# Patient Record
Sex: Female | Born: 1950 | Race: White | Hispanic: No | Marital: Married | Smoking: Former smoker
Health system: Southern US, Community
[De-identification: ages and names within clinical notes are randomized; demographics above are authoritative.]

## PROBLEM LIST (undated history)

## (undated) DIAGNOSIS — Z460 Encounter for fitting and adjustment of spectacles and contact lenses: Secondary | ICD-10-CM

## (undated) DIAGNOSIS — K219 Gastro-esophageal reflux disease without esophagitis: Secondary | ICD-10-CM

## (undated) DIAGNOSIS — Z923 Personal history of irradiation: Secondary | ICD-10-CM

## (undated) DIAGNOSIS — C801 Malignant (primary) neoplasm, unspecified: Secondary | ICD-10-CM

## (undated) DIAGNOSIS — K831 Obstruction of bile duct: Secondary | ICD-10-CM

## (undated) DIAGNOSIS — C419 Malignant neoplasm of bone and articular cartilage, unspecified: Secondary | ICD-10-CM

## (undated) HISTORY — DX: Hypercalcemia: E83.52

---

## 1998-07-11 HISTORY — PX: BREAST SURGERY: SHX581

## 1999-01-09 HISTORY — PX: OTHER SURGICAL HISTORY: SHX169

## 2000-10-18 ENCOUNTER — Other Ambulatory Visit: Admission: RE | Admit: 2000-10-18 | Discharge: 2000-10-18 | Payer: Self-pay | Admitting: *Deleted

## 2002-04-03 ENCOUNTER — Other Ambulatory Visit: Admission: RE | Admit: 2002-04-03 | Discharge: 2002-04-03 | Payer: Self-pay | Admitting: *Deleted

## 2003-07-31 ENCOUNTER — Other Ambulatory Visit: Admission: RE | Admit: 2003-07-31 | Discharge: 2003-07-31 | Payer: Self-pay | Admitting: Internal Medicine

## 2003-08-05 ENCOUNTER — Encounter: Admission: RE | Admit: 2003-08-05 | Discharge: 2003-08-05 | Payer: Self-pay | Admitting: Internal Medicine

## 2003-09-30 ENCOUNTER — Encounter: Admission: RE | Admit: 2003-09-30 | Discharge: 2003-09-30 | Payer: Self-pay | Admitting: Internal Medicine

## 2005-06-13 ENCOUNTER — Other Ambulatory Visit: Admission: RE | Admit: 2005-06-13 | Discharge: 2005-06-13 | Payer: Self-pay | Admitting: Obstetrics and Gynecology

## 2006-11-10 ENCOUNTER — Other Ambulatory Visit: Admission: RE | Admit: 2006-11-10 | Discharge: 2006-11-10 | Payer: Self-pay | Admitting: *Deleted

## 2008-11-03 ENCOUNTER — Other Ambulatory Visit: Admission: RE | Admit: 2008-11-03 | Discharge: 2008-11-03 | Payer: Self-pay | Admitting: Family Medicine

## 2008-12-15 ENCOUNTER — Encounter: Admission: RE | Admit: 2008-12-15 | Discharge: 2008-12-15 | Payer: Self-pay | Admitting: Family Medicine

## 2010-09-21 ENCOUNTER — Other Ambulatory Visit: Payer: Self-pay | Admitting: Family Medicine

## 2010-09-21 ENCOUNTER — Ambulatory Visit
Admission: RE | Admit: 2010-09-21 | Discharge: 2010-09-21 | Disposition: A | Discharge status: Home / Self Care | Payer: Self-pay | Source: Ambulatory Visit | Attending: Family Medicine | Admitting: Family Medicine

## 2010-09-21 DIAGNOSIS — T148XXA Other injury of unspecified body region, initial encounter: Secondary | ICD-10-CM

## 2011-05-23 ENCOUNTER — Ambulatory Visit
Admission: RE | Admit: 2011-05-23 | Discharge: 2011-05-23 | Disposition: A | Discharge status: Home / Self Care | Payer: BC Managed Care – PPO | Source: Ambulatory Visit | Attending: Family Medicine | Admitting: Family Medicine

## 2011-05-23 ENCOUNTER — Other Ambulatory Visit: Payer: Self-pay | Admitting: Family Medicine

## 2011-05-23 DIAGNOSIS — R52 Pain, unspecified: Secondary | ICD-10-CM

## 2011-08-17 ENCOUNTER — Other Ambulatory Visit (HOSPITAL_COMMUNITY)
Admission: RE | Admit: 2011-08-17 | Discharge: 2011-08-17 | Disposition: A | Discharge status: Home / Self Care | Payer: BC Managed Care – PPO | Source: Ambulatory Visit | Attending: Internal Medicine | Admitting: Internal Medicine

## 2011-08-17 ENCOUNTER — Other Ambulatory Visit: Payer: Self-pay | Admitting: Family Medicine

## 2011-08-17 DIAGNOSIS — Z01419 Encounter for gynecological examination (general) (routine) without abnormal findings: Secondary | ICD-10-CM | POA: Insufficient documentation

## 2012-09-06 ENCOUNTER — Other Ambulatory Visit: Payer: Self-pay | Admitting: Dermatology

## 2012-10-26 ENCOUNTER — Ambulatory Visit (INDEPENDENT_AMBULATORY_CARE_PROVIDER_SITE_OTHER): Payer: BC Managed Care – PPO | Admitting: Sports Medicine

## 2012-10-26 ENCOUNTER — Encounter: Payer: Self-pay | Admitting: Sports Medicine

## 2012-10-26 VITALS — BP 121/73 | HR 71 | Ht 65.0 in | Wt 148.0 lb

## 2012-10-26 DIAGNOSIS — S76312A Strain of muscle, fascia and tendon of the posterior muscle group at thigh level, left thigh, initial encounter: Secondary | ICD-10-CM

## 2012-10-26 DIAGNOSIS — IMO0002 Reserved for concepts with insufficient information to code with codable children: Secondary | ICD-10-CM

## 2012-10-26 DIAGNOSIS — S76319A Strain of muscle, fascia and tendon of the posterior muscle group at thigh level, unspecified thigh, initial encounter: Secondary | ICD-10-CM | POA: Insufficient documentation

## 2012-10-26 NOTE — Patient Instructions (Addendum)
Thank you for coming in today. Do the hamstring exercises we talked about: Google video search for askling hamstring protocol Extender Swing Diver  Hip abduction exercises Side leg raise Front step ups Side step ups  Wear the sleeve during your runs and 30-60 mins after. Do not sleep in it.   Take a picture of you at the finish line and send it in to [place].

## 2012-10-26 NOTE — Assessment & Plan Note (Signed)
Left hamstring strain. Associated with weak hip abductors  H. test okay.  Plan: Askling hamstring exercises Hip abduction strength exercise Thigh sleeve Followup as needed

## 2012-10-26 NOTE — Progress Notes (Signed)
Bianca Logan is a 62 y.o. female who presents to [facility] today for left hamstring injury. Patient is training for her first half marathon which is scheduled for May 3.  One month ago doing a hill work out she felt a pulling sensation in her left hamstring.  She has had continued left hamstring pain since. She notes the pain is present initially for the first several miles which improved during a run and is again painful following the end of the right. She is able to continue running and ran a long 12 mile run last weekend.  She feels well otherwise no radiating pain weakness numbness fevers or chills.   PMH reviewed.  History  Substance Use Topics  . Smoking status: Former Smoker    Start date: 07/11/1984  . Smokeless tobacco: Never Used  . Alcohol Use: Not on file   ROS as above otherwise neg   Exam:  BP 121/73  Pulse 71  Ht 5\' 5"  (1.651 m)  Wt 148 lb (67.132 kg)  BMI 24.63 kg/m2 Gen: Well NAD MSK: Left hamstring. Normal-appearing no palpable defects. Tender in the mid muscle belly. Strength is 4+/5 H. Test: 90 bilaterally Hop test: 50% decreased left compared to right Hip abduction strength: 4/5 bilaterally Leg length: Equal bilaterally Gait: Left foot externally rotated. Heel striker.  Pulses capillary refill and sensation are intact distally

## 2012-12-06 ENCOUNTER — Ambulatory Visit: Payer: BC Managed Care – PPO | Admitting: Sports Medicine

## 2013-04-25 ENCOUNTER — Ambulatory Visit
Admission: RE | Admit: 2013-04-25 | Discharge: 2013-04-25 | Disposition: A | Discharge status: Home / Self Care | Payer: BC Managed Care – PPO | Source: Ambulatory Visit | Attending: Family Medicine | Admitting: Family Medicine

## 2013-04-25 ENCOUNTER — Other Ambulatory Visit: Payer: Self-pay | Admitting: Family Medicine

## 2013-04-25 DIAGNOSIS — R110 Nausea: Secondary | ICD-10-CM

## 2013-04-25 DIAGNOSIS — R630 Anorexia: Secondary | ICD-10-CM

## 2013-04-25 MED ORDER — IOHEXOL 300 MG/ML  SOLN
100.0000 mL | Freq: Once | INTRAMUSCULAR | Status: AC | PRN
  Administered 2013-04-25: 100 mL via INTRAVENOUS

## 2013-04-26 ENCOUNTER — Encounter (HOSPITAL_COMMUNITY): Payer: Self-pay

## 2013-04-26 ENCOUNTER — Inpatient Hospital Stay (HOSPITAL_COMMUNITY): Payer: BC Managed Care – PPO

## 2013-04-26 ENCOUNTER — Inpatient Hospital Stay (HOSPITAL_COMMUNITY)
Admission: AD | Admit: 2013-04-26 | Discharge: 2013-04-27 | DRG: 444 | Disposition: A | Discharge status: Home / Self Care | Payer: BC Managed Care – PPO | Source: Ambulatory Visit | Attending: Internal Medicine | Admitting: Internal Medicine

## 2013-04-26 DIAGNOSIS — E43 Unspecified severe protein-calorie malnutrition: Secondary | ICD-10-CM | POA: Diagnosis present

## 2013-04-26 DIAGNOSIS — K838 Other specified diseases of biliary tract: Principal | ICD-10-CM

## 2013-04-26 DIAGNOSIS — C786 Secondary malignant neoplasm of retroperitoneum and peritoneum: Secondary | ICD-10-CM

## 2013-04-26 DIAGNOSIS — IMO0002 Reserved for concepts with insufficient information to code with codable children: Secondary | ICD-10-CM

## 2013-04-26 DIAGNOSIS — C259 Malignant neoplasm of pancreas, unspecified: Secondary | ICD-10-CM | POA: Diagnosis present

## 2013-04-26 DIAGNOSIS — C801 Malignant (primary) neoplasm, unspecified: Secondary | ICD-10-CM | POA: Diagnosis present

## 2013-04-26 DIAGNOSIS — R17 Unspecified jaundice: Secondary | ICD-10-CM

## 2013-04-26 DIAGNOSIS — M542 Cervicalgia: Secondary | ICD-10-CM

## 2013-04-26 DIAGNOSIS — C787 Secondary malignant neoplasm of liver and intrahepatic bile duct: Secondary | ICD-10-CM | POA: Diagnosis present

## 2013-04-26 DIAGNOSIS — S76319S Strain of muscle, fascia and tendon of the posterior muscle group at thigh level, unspecified thigh, sequela: Secondary | ICD-10-CM

## 2013-04-26 DIAGNOSIS — C8589 Other specified types of non-Hodgkin lymphoma, extranodal and solid organ sites: Secondary | ICD-10-CM

## 2013-04-26 DIAGNOSIS — S76319D Strain of muscle, fascia and tendon of the posterior muscle group at thigh level, unspecified thigh, subsequent encounter: Secondary | ICD-10-CM

## 2013-04-26 DIAGNOSIS — R110 Nausea: Secondary | ICD-10-CM

## 2013-04-26 DIAGNOSIS — R109 Unspecified abdominal pain: Secondary | ICD-10-CM

## 2013-04-26 HISTORY — DX: Obstruction of bile duct: K83.1

## 2013-04-26 HISTORY — DX: Malignant (primary) neoplasm, unspecified: C80.1

## 2013-04-26 LAB — CBC
HCT: 37 % (ref 36.0–46.0)
MCHC: 33.5 g/dL (ref 30.0–36.0)
Platelets: 502 10*3/uL — ABNORMAL HIGH (ref 150–400)
RBC: 4.34 MIL/uL (ref 3.87–5.11)
RDW: 13.9 % (ref 11.5–15.5)
WBC: 10.6 10*3/uL — ABNORMAL HIGH (ref 4.0–10.5)

## 2013-04-26 LAB — COMPREHENSIVE METABOLIC PANEL
ALT: 229 U/L — ABNORMAL HIGH (ref 0–35)
AST: 84 U/L — ABNORMAL HIGH (ref 0–37)
CO2: 27 mEq/L (ref 19–32)
Calcium: 9.7 mg/dL (ref 8.4–10.5)
Creatinine, Ser: 0.71 mg/dL (ref 0.50–1.10)
GFR calc non Af Amer: >90 mL/min (ref >90)
Sodium: 135 mEq/L (ref 135–145)
Total Protein: 7.2 g/dL (ref 6.0–8.3)

## 2013-04-26 LAB — PHOSPHORUS: Phosphorus: 3.3 mg/dL (ref 2.3–4.6)

## 2013-04-26 LAB — MAGNESIUM: Magnesium: 2.1 mg/dL (ref 1.5–2.5)

## 2013-04-26 MED ORDER — ONDANSETRON HCL 4 MG/2ML IJ SOLN
4.0000 mg | INTRAMUSCULAR | Status: DC | PRN
  Filled 2013-04-26: qty 2

## 2013-04-26 MED ORDER — INFLUENZA VAC SPLIT QUAD 0.5 ML IM SUSP
0.5000 mL | INTRAMUSCULAR | Status: AC
  Administered 2013-04-27: 0.5 mL via INTRAMUSCULAR
  Filled 2013-04-26 (×2): qty 0.5

## 2013-04-26 MED ORDER — ENSURE COMPLETE PO LIQD: 237.0000 mL | ORAL | Status: DC | PRN

## 2013-04-26 MED ORDER — ONDANSETRON HCL 4 MG PO TABS: 4.0000 mg | ORAL_TABLET | Freq: Four times a day (QID) | ORAL | Status: DC | PRN

## 2013-04-26 MED ORDER — LORAZEPAM 2 MG/ML IJ SOLN
1.0000 mg | Freq: Every evening | INTRAMUSCULAR | Status: DC | PRN
  Administered 2013-04-26: 1 mg via INTRAVENOUS
  Filled 2013-04-26: qty 1

## 2013-04-26 MED ORDER — ONDANSETRON HCL 4 MG/2ML IJ SOLN: 4.0000 mg | Freq: Four times a day (QID) | INTRAMUSCULAR | Status: DC | PRN

## 2013-04-26 MED ORDER — HYDROMORPHONE HCL PF 1 MG/ML IJ SOLN: 1.0000 mg | INTRAMUSCULAR | Status: DC | PRN

## 2013-04-26 MED ORDER — GADOBENATE DIMEGLUMINE 529 MG/ML IV SOLN
13.0000 mL | Freq: Once | INTRAVENOUS | Status: AC | PRN
  Administered 2013-04-26: 13 mL via INTRAVENOUS

## 2013-04-26 MED ORDER — OXYCODONE HCL 5 MG PO TABS
5.0000 mg | ORAL_TABLET | ORAL | Status: DC | PRN
  Administered 2013-04-26: 5 mg via ORAL
  Filled 2013-04-26: qty 1

## 2013-04-26 MED ORDER — ENOXAPARIN SODIUM 40 MG/0.4ML ~~LOC~~ SOLN
40.0000 mg | SUBCUTANEOUS | Status: DC
  Filled 2013-04-26 (×2): qty 0.4

## 2013-04-26 NOTE — Consult Note (Signed)
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 She then began to develop the indolent onset of nausea and a dull upper abdominal ache. She took a nonsteroidal which made her symptoms worse. She changed to Tylenol which did not help at all. She noticed a change in her stools becoming greenish yellow. She has not had any fever. She reported these symptoms to her primary care physician. I do not have the laboratory data available that was done yesterday. I suspect her bilirubin was elevated. She was referred for a CT scan of the abdomen which was done yesterday. I reviewed the images. This shows significant intra-and extrahepatic bile duct obstruction, an amorphous soft tissue mass in the porta hepatis with additional malignant appearing retroperitoneal lymph nodes bilaterally largest 2.6 x 1.9 cm. There is a 1.8 cm lesion in the right hepatic dome and a second 2.3 cm lesion inferiorly in the tail of the right lobe. There is no focal abnormality of the pancreas. Incidentally noted is a thickened gallbladder wall with multiple gallstones. There is a mild to moderate compression fracture superior endplate L1. A tiny sclerotic focus within the inferior aspect of L4.  Her mother died at age 44 of metastatic cancer unknown primary.   Past Medical History  Diagnosis Date  . Medical history non-contributory   : No hypertension, diabetes, ulcers, asthma, hepatitis, previous jaundice, kidney stones, thyroid trouble, seizure, stroke, or blood clots.   Past Surgical History   Procedure Laterality Date  . Cyst removed from left breast "benign   01/1999  :  . [START ON 04/27/2013] influenza vac split quadrivalent PF  0.5 mL Intramuscular Tomorrow-1000  :  No Known Allergies:  Family History  Problem Relation Age of Onset  . Cancer Mother  53   . Heart attack Father   . Anuerysm Sister  45   . Obesity Brother  51   :  History  Married. 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 2 daughters who are both healthy. Social History  . Marital Status: Married    Spouse Name: N/A    Number of Children: N/A  . Years of Education: N/A   Occupational History  .  legal assistant    Social History Main Topics  . Smoking status: Former Smoker    Types: Cigarettes    Start date: 07/11/1984    Quit date: 04/26/1985  . Smokeless tobacco: Never Used  . Alcohol Use: 2.4 oz/week    2 Glasses of wine, 2 Cans of beer per week  . Drug Use: No  . Sexual Activity: Yes    Birth Control/ Protection: None   Other Topics Concern  . Not on file   Social History Narrative  . No narrative on file  :  ROS: Eyes: No change in vision Throat: No sore throat Neck: See history of present illness Resp: No cough or dyspnea  Cardio: No chest pain or palpitations GI: See history of present illness. She denies any hematochezia or melena. Extremities: No edema Lymph nodes: No swollen  glands Neurologic: Intermittent headaches associated with right neck pain   Skin: No rash. Recent jaundice.. Genitourinary: Recent hematuria felt to be secondary to a urinary tract infection.   Vitals: Filed Vitals:   04/26/13 1059  BP: 109/57  Pulse: 81  Temp: 98.7 F (37.1 C)  Resp: 20    PHYSICAL EXAM: General appearance: Well-nourished Caucasian woman HEENT: Pharynx no erythema or exudate. No thyromegaly or thyroid nodules Lymph Nodes: No cervical, supraclavicular, or axillary adenopathy Resp: Clear to auscultation resonant to percussion Cardio: Regular rhythm no  murmur Vascular: Carotids 2+ no bruits Breasts: Not examined GI: Abdomen is soft, minimally tender in the epigastrium, increased bowel sounds, no palpable mass or organomegaly GU: Extremities: No edema, no calf tenderness Neurologic: Mental status intact, PERRLA, full extraocular movements, motor strength 5 over 5, reflexes 1+ symmetric. Upper body coordination normal. Gait not tested Skin: Mildly jaundiced  Labs: Pending  No results found for this basename: WBC, HGB, HCT, PLT,  in the last 72 hours No results found for this basename: NA, K, CL, CO2, GLUCOSE, BUN, CREATININE, CALCIUM,  in the last 72 hours    Images Studies/Results:  Ct Abdomen W Contrast  04/25/2013   CLINICAL DATA:  Upper abdominal pain, nausea, decreased appetite. Elevated LFTs.  EXAM: CT ABDOMEN WITH CONTRAST  TECHNIQUE: Multidetector CT imaging of the abdomen was performed using the standard protocol following bolus administration of intravenous contrast.  CONTRAST:  OMNIPAQUE IOHEXOL 300 MG/ML  SOLN  COMPARISON:  CT 12/15/2008  FINDINGS: Lung bases are clear. No effusions. Heart is normal size.  There is an in their scratch head there is an irregular solid-appearing lesion in the right hepatic dome measuring 18 mm on image 7 of series 2. Inferiorly in the right hepatic lobe, a 2nd solid-appearing lesion is noted measuring 2.3 cm on image 38.  Multiple layering gallstones noted within the gallbladder. There is gallbladder wall thickening measuring up to 8 mm near the fundus of the gallbladder. Significant intrahepatic biliary ductal dilatation. The common bile duct is difficult to visualize in the pancreas. The common bile duct is dilated in the porta hepatis, within not visualized below the superior aspect of the pancreatic head.  Spleen, adrenals and kidneys are unremarkable. No visible focal pancreatic abnormality.  There is retroperitoneal adenopathy. Bulky lymph nodes bilaterally on image 25. Aortocaval node on  image 25 measures 2.6 x 1.9 cm. Mildly prominent central mesenteric lymph nodes. Ill-defined porta hepatis adenopathy, difficult to measure.  Moderate stool throughout the colon. Visualized small bowel is decompressed and unremarkable. Aorta is normal caliber.  Mild to moderate compression fracture through the superior endplate of L1. Tiny sclerotic focus within the inferior anterior aspect of L4, nonspecific. Cannot exclude a small sclerotic metastasis.  IMPRESSION: Solid appearing ill-defined lesions in the right lobe of the liver as described above concerning for metastases.  Marked intrahepatic and proximal extrahepatic biliary ductal dilatation. The common bile duct cannot be visualized below the superior aspect of the pancreatic head. The bili obstruction may be related to compression from enlarged porta hepatis lymph nodes. Cannot exclude a Klatskin tumor other central biliary malignancy. No definite pancreatic head obstructing mass noted.  There is a focal gallbladder wall thickening, most likely related to cholecystitis. Cannot completely exclude gallbladder cancer.  Cholelithiasis.  Bulky retroperitoneal and porta hepatis adenopathy.  Recommend MRI of the abdomen without and with contrast and MRCP for further evaluation.  These results were discussed with Dr. Jillyn Hidden at the time of interpretation.  Electronically Signed   By: Charlett Nose M.D.   On: 04/25/2013 16:47      Assessment: Painless jaundice in an otherwise healthy woman. Malignant changes on CT scan with intra-and extrahepatic biliary duct obstruction and presence of a soft tissue mass in the porta hepatis with additional bilateral retroperitoneal lymphadenopathy. 2 lesions in the liver. Pancreas appears grossly normal.  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  Both of the liver lesions would be difficult to biopsy. Lesion in the dome with  increased risk for  pneumothorax. Lesion in the tail of the right lobe very superficial without a good margin of tissue surrounding so a biopsy would be associated with an increased risk for bleeding. Probably the best approach would be ERCP with biopsy and stenting to relieve immediate biliary obstruction and obtain a tissue diagnosis. She may also need a bone scan in view of the neck pain and atypical changes in the bones on CT although the likelihood of bone metastases with a GI malignancy is low.   I discussed the situation with gastroenterology Dr. Ewing Schlein who will evaluate the patient this afternoon. When a biopsy diagnosis is established, oncology will get involved again for a treatment plan.       Recommendation:   Lira Stephen M 04/26/2013, 1:31 PM

## 2013-04-26 NOTE — Consult Note (Signed)
Reason for Consult: Obstructive jaundice abnormal CT Referring Physician: James Granfortuna  Bianca Logan is an 62 y.o. female.  HPI: Patient without any previous GI complaints until about a week or 2 ago with some decreased appetite and nausea and change in bowel habits and darkening urine who was found to be jaundiced and CT showed probable hilar obstruction and she was admitted for further workup and plans. She did have a partial colonoscopy and virtual colonoscopy in the past by one of my partners and did have an abnormal mole removed and the pathology is on the computer but otherwise she has been very healthy in fact was planning a half marathon in November and she has not had any fever or vomiting and no other specific complaints  Past Medical History  Diagnosis Date  . Medical history non-contributory   . 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 Cyst removed from left breast  01/1999    Family History  Problem Relation Age of Onset  . Cancer Mother   . Heart attack Father   . Anuerysm Sister   . Obesity Brother     Social History:  reports that she quit smoking about 28 years ago. Her smoking use included Cigarettes. She started smoking about 28 years ago. She smoked 0.00 packs per day. She has never used smokeless tobacco. She reports that she drinks about 2.4 ounces of alcohol per week. She reports that she does not use illicit drugs.  Allergies: No Known Allergies  Medications: I have reviewed the patient's current medications.  Results for orders placed during the hospital encounter of 04/26/13 (from the past 48 hour(s))  COMPREHENSIVE METABOLIC PANEL     Status: Abnormal   Collection Time    04/26/13  3:40 PM      Result Value Range   Sodium 135  135 - 145 mEq/L   Potassium 3.3 (*) 3.5 - 5.1 mEq/L   Chloride 98  96 - 112 mEq/L   CO2 27  19 - 32 mEq/L   Glucose, Bld 126 (*) 70 - 99 mg/dL   BUN 11  6 - 23 mg/dL    Creatinine, Ser 0.71  0.50 - 1.10 mg/dL   Calcium 9.7  8.4 - 10.5 mg/dL   Total Protein 7.2  6.0 - 8.3 g/dL   Albumin 3.4 (*) 3.5 - 5.2 g/dL   AST 84 (*) 0 - 37 U/L   ALT 229 (*) 0 - 35 U/L   Alkaline Phosphatase 554 (*) 39 - 117 U/L   Total Bilirubin 7.5 (*) 0.3 - 1.2 mg/dL   GFR calc non Af Amer >90  >90 mL/min   GFR calc Af Amer >90  >90 mL/min   Comment: (NOTE)     The eGFR has been calculated using the CKD EPI equation.     This calculation has not been validated in all clinical situations.     eGFR's persistently <90 mL/min signify possible Chronic Kidney     Disease.  MAGNESIUM     Status: None   Collection Time    04/26/13  3:40 PM      Result Value Range   Magnesium 2.1  1.5 - 2.5 mg/dL  PHOSPHORUS     Status: None   Collection Time    04/26/13  3:40 PM      Result Value Range   Phosphorus 3.3  2.3 - 4.6 mg/dL  CBC     Status:   Abnormal   Collection Time    04/26/13  3:40 PM      Result Value Range   WBC 10.6 (*) 4.0 - 10.5 K/uL   RBC 4.34  3.87 - 5.11 MIL/uL   Hemoglobin 12.4  12.0 - 15.0 g/dL   HCT 37.0  36.0 - 46.0 %   MCV 85.3  78.0 - 100.0 fL   MCH 28.6  26.0 - 34.0 pg   MCHC 33.5  30.0 - 36.0 g/dL   RDW 13.9  11.5 - 15.5 %   Platelets 502 (*) 150 - 400 K/uL    Ct Abdomen W Contrast  04/25/2013   CLINICAL DATA:  Upper abdominal pain, nausea, decreased appetite. Elevated LFTs.  EXAM: CT ABDOMEN WITH CONTRAST  TECHNIQUE: Multidetector CT imaging of the abdomen was performed using the standard protocol following bolus administration of intravenous contrast.  CONTRAST:  100mL OMNIPAQUE IOHEXOL 300 MG/ML  SOLN  COMPARISON:  CT 12/15/2008  FINDINGS: Lung bases are clear. No effusions. Heart is normal size.  There is an in their scratch head there is an irregular solid-appearing lesion in the right hepatic dome measuring 18 mm on image 7 of series 2. Inferiorly in the right hepatic lobe, a 2nd solid-appearing lesion is noted measuring 2.3 cm on image 38.  Multiple  layering gallstones noted within the gallbladder. There is gallbladder wall thickening measuring up to 8 mm near the fundus of the gallbladder. Significant intrahepatic biliary ductal dilatation. The common bile duct is difficult to visualize in the pancreas. The common bile duct is dilated in the porta hepatis, within not visualized below the superior aspect of the pancreatic head.  Spleen, adrenals and kidneys are unremarkable. No visible focal pancreatic abnormality.  There is retroperitoneal adenopathy. Bulky lymph nodes bilaterally on image 25. Aortocaval node on image 25 measures 2.6 x 1.9 cm. Mildly prominent central mesenteric lymph nodes. Ill-defined porta hepatis adenopathy, difficult to measure.  Moderate stool throughout the colon. Visualized small bowel is decompressed and unremarkable. Aorta is normal caliber.  Mild to moderate compression fracture through the superior endplate of L1. Tiny sclerotic focus within the inferior anterior aspect of L4, nonspecific. Cannot exclude a small sclerotic metastasis.  IMPRESSION: Solid appearing ill-defined lesions in the right lobe of the liver as described above concerning for metastases.  Marked intrahepatic and proximal extrahepatic biliary ductal dilatation. The common bile duct cannot be visualized below the superior aspect of the pancreatic head. The bili obstruction may be related to compression from enlarged porta hepatis lymph nodes. Cannot exclude a Klatskin tumor other central biliary malignancy. No definite pancreatic head obstructing mass noted.  There is a focal gallbladder wall thickening, most likely related to cholecystitis. Cannot completely exclude gallbladder cancer.  Cholelithiasis.  Bulky retroperitoneal and porta hepatis adenopathy.  Recommend MRI of the abdomen without and with contrast and MRCP for further evaluation.  These results were discussed with Dr. Fulp at the time of interpretation.   Electronically Signed   By: Kevin  Dover M.D.    On: 04/25/2013 16:47    ROS negative except above Blood pressure 117/62, pulse 75, temperature 98.8 F (37.1 C), temperature source Oral, resp. rate 20, height 5' 4" (1.626 m), weight 63.957 kg (141 lb), SpO2 100.00%. Physical Exam vital signs stable afebrile no acute distress patient not examined today looks well lying comfortable in her bed office labs and hospital computer and CT reviewed  Assessment/Plan: Obstructive jaundice probably due to hilar mass questionable etiology Plan: I've discussed her   case with the oncologist as well as my partner Dr. outlaw and we will proceed with an MRCP today and then she can go home and return for an outpatient ERCP and probable stent placement and hopefully brushings as well and possible EUS on Tuesday and I have discussed the risks benefits methods of both procedure with both the husband and the wife and scheduled the above procedure with the endoscopy unit and they will call us sooner when necessary in the warnings were discussed  Dyneshia Baccam E 04/26/2013, 4:39 PM      

## 2013-04-26 NOTE — Progress Notes (Signed)
INITIAL NUTRITION ASSESSMENT  Pt meets criteria for severe MALNUTRITION in the context of acute illness as evidenced by <50% estimated energy intake with 3.4% weight loss in the past 1.5 weeks per pt report.  DOCUMENTATION CODES Per approved criteria  -Severe malnutrition in the context of acute illness or injury   INTERVENTION: - Ensure Complete PRN for additional nutrition as needed - Encouraged increased intake as nausea resolves - Will continue to monitor   NUTRITION DIAGNOSIS: Unintended weight loss related to nausea with poor appetite as evidenced by pt report.   Goal: 1. Complete resolution of nausea 2. Pt to consume >90% of meals  Monitor:  Weights, labs, intake  Reason for Assessment: Nutrition risk   62 y.o. female  Admitting Dx: Painless jaundice and biliary obstruction  ASSESSMENT: Pt admitted with 3 week history of right neck pain and low back pain. Pt started to develop nausea which she reports went on for 1.5 weeks. Pt denies any emesis but states she was only eating small amounts of food 3 times/day and lost 5 pounds unintentionally during this time frame. This is very unusual for her because she typically eats well and has a great appetite. Pt also had change in her stools becoming greenish yellow.   Pt found to have painless jaundice with malignant changes on CT scan with "intra-and extrahepatic biliary duct obstruction and presence of a soft tissue mass in the porta hepatis with additional bilateral retroperitoneal lymphadenopathy" per MD notes. Pt reports she has had improvement in nausea today and was able to eat some chocolate pudding for lunch. Pt interested in trying Ensure on an as needed basis as she was told by her MD that it would be beneficial for her.    Height: Ht Readings from Last 1 Encounters:  04/26/13 5\' 4"  (1.626 m)    Weight: Wt Readings from Last 1 Encounters:  04/26/13 141 lb (63.957 kg)    Ideal Body Weight: 120 lb   % Ideal Body  Weight: 117%  Wt Readings from Last 10 Encounters:  04/26/13 141 lb (63.957 kg)  10/26/12 148 lb (67.132 kg)    Usual Body Weight: 146 lb per pt  % Usual Body Weight: 96%  BMI:  Body mass index is 24.19 kg/(m^2).  Estimated Nutritional Needs: Kcal: 1600-1800 Protein: 65-75g Fluid: 1.6-1.8L/day  Skin: Intact   Diet Order: General  EDUCATION NEEDS: -No education needs identified at this time  No intake or output data in the 24 hours ending 04/26/13 1535  Last BM: PTA  Labs:  No results found for this basename: NA, K, CL, CO2, BUN, CREATININE, CALCIUM, MG, PHOS, GLUCOSE,  in the last 168 hours  CBG (last 3)  No results found for this basename: GLUCAP,  in the last 72 hours  Scheduled Meds: . enoxaparin (LOVENOX) injection  40 mg Subcutaneous Q24H  . [START ON 04/27/2013] influenza vac split quadrivalent PF  0.5 mL Intramuscular Tomorrow-1000    Continuous Infusions:   Past Medical History  Diagnosis Date  . Medical history non-contributory   . 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 Cyst removed from left breast  01/1999    Levon Hedger MS, RD, LDN [phone] Pager [phone] After Hours Pager

## 2013-04-26 NOTE — H&P (Signed)
[facility] History and Physical  Bianca Logan ZOX:[id number] DOB: October 26, 1950 DOA: 04/26/2013  Referring physician: ED physician PCP: Cain Saupe, MD   Chief Complaint: nausea  HPI:  Pt is 62 yo very healthy and pleasant female who was admitted directly from Dr. Jillyn Hidden office for further evaluation and management of new onset jaundice, abnormal liver enzymes, abnormal CT findings of the abdomen. Patient explains she has been in her usual state of health but over the past week she noted more nausea, loss of appetite and has noticed that you urine changed color to gold and stool appeared to be more yellowish than usual. Patient explains she's very active, runs regularly and was training for half marathon over the past month. She denies recent sicknesses or hospitalizations, no fevers and chills, no significant changes in her overall health, no weight loss. She denies chest pain and shortness of breath, no specific focal neurologiacal symptoms. She has noted her skin changed color to more yellowish tone. She has never had similar problem in the past. She reports she did travel to [place] in August 2014 and did not have any specific medical concerns since the arrival to [place]. She also explains she's not taking any specific medicines except Advil over the past week for neck pain, she was prescribed omeprazole over the counter for control of nausea but only took it for few days.   Blood work from PCP office CBC WNL, BMP WNL, ALP 481, AST 113, Alt 268, TBili 6.1  Ct Abdomen W Contrast  04/25/2013   Solid appearing ill-defined lesions in the right lobe of the liver as described above concerning for metastases.  Marked intrahepatic and proximal extrahepatic biliary ductal dilatation. The common bile duct cannot be visualized below the superior aspect of the pancreatic head. The bili obstruction may be related to compression from enlarged porta hepatis lymph nodes. Cannot exclude a Klatskin tumor  other central biliary malignancy. No definite pancreatic head obstructing mass noted.  There is a focal gallbladder wall thickening, most likely related to cholecystitis. Cannot completely exclude gallbladder cancer.  Cholelithiasis.  Bulky retroperitoneal and porta hepatis adenopathy.  Recommend MRI of the abdomen without and with contrast and MRCP for further evaluation.   Assessment and Plan: Jaundice - appreciate GI and oncology input - will repeat CMET and will follow up on GI recommendations - plan for MRCP tonight - discussed with pt and husband at bedside over 1 hour in duration, results of the current test, plan for further evaluation, follow up plan   - possible d/c home later this evening  Abnormal liver enzymes - related to principal problem - evaluation as noted above   Code Status: Full Family Communication: Pt and husband at bedside Disposition Plan: Admit to medical floor   Review of Systems:  Constitutional: Negative for fever, chills and malaise/fatigue. Negative for diaphoresis.  HENT: Negative for hearing loss, ear pain, nosebleeds, congestion, sore throat, neck pain, tinnitus and ear discharge.   Eyes: Negative for blurred vision, double vision, photophobia, pain, discharge and redness.  Respiratory: Negative for cough, hemoptysis, sputum production, shortness of breath, wheezing and stridor.   Cardiovascular: Negative for chest pain, palpitations, orthopnea, claudication and leg swelling.  Gastrointestinal: Negative for  vomiting and abdominal pain. Negative for heartburn,  blood in stool and melena.  Genitourinary: Negative for dysuria, urgency, frequency, hematuria and flank pain.  Musculoskeletal: Negative for myalgias, back pain, joint pain and falls.  Skin: Negative for itching and rash.  Neurological: Negative for tingling,  tremors, sensory change, speech change, focal weakness, loss of consciousness and headaches.  Endo/Heme/Allergies: Negative for  environmental allergies and polydipsia. Does not bruise/bleed easily.  Psychiatric/Behavioral: Negative for suicidal ideas. The patient is not nervous/anxious.      Past Medical History  Diagnosis Date  . Medical history non-contributory     Past Surgical History  Procedure Laterality Date  . Cyst removed from left breast  01/1999    Social History:  reports that she quit smoking about 28 years ago. Her smoking use included Cigarettes. She started smoking about 28 years ago. She smoked 0.00 packs per day. She has never used smokeless tobacco. She reports that she drinks about 2.4 ounces of alcohol per week. She reports that she does not use illicit drugs.  No Known Allergies  Family History  Problem Relation Age of Onset  . Cancer Mother   . Heart attack Father   . Anuerysm Sister   . Obesity Brother     Prior to Admission medications   Medication Sig Start Date End Date Taking? Authorizing Provider  calcium-vitamin D (OSCAL WITH D) 500-200 MG-UNIT per tablet Take 1 tablet by mouth daily with breakfast.   Yes Historical Provider, MD  HYDROcodone-acetaminophen (NORCO/VICODIN) 5-325 MG per tablet Take 1 tablet by mouth every 6 (six) hours as needed.  04/23/13  Yes Historical Provider, MD  Multiple Vitamins-Minerals (MULTIVITAMIN WITH MINERALS) tablet Take 1 tablet by mouth daily.   Yes Historical Provider, MD  omeprazole (PRILOSEC) 20 MG capsule Take 20 mg by mouth 2 (two) times daily. 04/23/13  Yes Historical Provider, MD    Physical Exam: Filed Vitals:   04/26/13 1059  BP: 109/57  Pulse: 81  Temp: 98.7 F (37.1 C)  TempSrc: Oral  Resp: 20  Height: 5\' 4"  (1.626 m)  Weight: 63.957 kg (141 lb)  SpO2: 100%    Physical Exam  Constitutional: Appears well-developed and well-nourished. No distress.  HENT: Normocephalic. External right and left ear normal. Oropharynx is clear and moist.  Eyes: Conjunctivae and EOM are normal. PERRLA, scleral icterus noted  Neck: Normal ROM.  Neck supple. No JVD. No tracheal deviation. No thyromegaly.  CVS: RRR, S1/S2 +, no murmurs, no gallops, no carotid bruit.  Pulmonary: Effort and breath sounds normal, no stridor, rhonchi, wheezes, rales.  Abdominal: Soft. BS +,  no distension, tenderness, rebound or guarding.  Musculoskeletal: Normal range of motion. No edema and no tenderness.  Lymphadenopathy: No lymphadenopathy noted, cervical, inguinal. Neuro: Alert. Normal reflexes, muscle tone coordination. No cranial nerve deficit. Skin: Skin is warm and dry. No rash noted. Jaundice noted  Psychiatric: Normal mood and affect. Behavior, judgment, thought content normal.   Labs on Admission:  Basic Metabolic Panel: No results found for this basename: NA, K, CL, CO2, GLUCOSE, BUN, CREATININE, CALCIUM, MG, PHOS,  in the last 168 hours Liver Function Tests: No results found for this basename: AST, ALT, ALKPHOS, BILITOT, PROT, ALBUMIN,  in the last 168 hours No results found for this basename: LIPASE, AMYLASE,  in the last 168 hours No results found for this basename: AMMONIA,  in the last 168 hours CBC: No results found for this basename: WBC, NEUTROABS, HGB, HCT, MCV, PLT,  in the last 168 hours Cardiac Enzymes: No results found for this basename: CKTOTAL, CKMB, CKMBINDEX, TROPONINI,  in the last 168 hours BNP: No components found with this basename: POCBNP,  CBG: No results found for this basename: GLUCAP,  in the last 168 hours  EKG: Normal sinus rhythm,  no ST/T wave changes  Debbora Presto, MD  [facility] Pager [phone]  If 7PM-7AM, please contact night-coverage [email or web address] Password [email or web address] 04/26/2013, 1:37 PM

## 2013-04-27 LAB — COMPREHENSIVE METABOLIC PANEL
ALT: 199 U/L — ABNORMAL HIGH (ref 0–35)
AST: 74 U/L — ABNORMAL HIGH (ref 0–37)
Albumin: 3.1 g/dL — ABNORMAL LOW (ref 3.5–5.2)
Alkaline Phosphatase: 508 U/L — ABNORMAL HIGH (ref 39–117)
BUN: 11 mg/dL (ref 6–23)
CO2: 27 mEq/L (ref 19–32)
Chloride: 99 mEq/L (ref 96–112)
GFR calc non Af Amer: >90 mL/min (ref >90)
Potassium: 4.2 mEq/L (ref 3.5–5.1)
Total Bilirubin: 6.3 mg/dL — ABNORMAL HIGH (ref 0.3–1.2)
Total Protein: 6.6 g/dL (ref 6.0–8.3)

## 2013-04-27 LAB — CBC
MCH: 28.6 pg (ref 26.0–34.0)
MCHC: 33.5 g/dL (ref 30.0–36.0)
MCV: 85.4 fL (ref 78.0–100.0)
Platelets: 435 10*3/uL — ABNORMAL HIGH (ref 150–400)
RDW: 14.2 % (ref 11.5–15.5)
WBC: 10.1 10*3/uL (ref 4.0–10.5)

## 2013-04-27 MED ORDER — ONDANSETRON HCL 4 MG PO TABS: 4.0000 mg | ORAL_TABLET | Freq: Four times a day (QID) | ORAL | Status: DC | PRN

## 2013-04-27 MED ORDER — OXYCODONE HCL 5 MG PO TABS: 5.0000 mg | ORAL_TABLET | ORAL | Status: DC | PRN

## 2013-04-27 MED ORDER — CALCIUM CARBONATE-VITAMIN D 500-200 MG-UNIT PO TABS: 1.0000 | ORAL_TABLET | Freq: Every day | ORAL | Status: DC

## 2013-04-27 MED ORDER — MULTI-VITAMIN/MINERALS PO TABS: 1.0000 | ORAL_TABLET | Freq: Every day | ORAL | Status: DC

## 2013-04-27 MED ORDER — ENSURE COMPLETE PO LIQD: 237.0000 mL | ORAL | Status: DC | PRN

## 2013-04-27 MED ORDER — OMEPRAZOLE 20 MG PO CPDR: 20.0000 mg | DELAYED_RELEASE_CAPSULE | Freq: Two times a day (BID) | ORAL | Status: DC

## 2013-04-27 MED ORDER — HYDROCODONE-ACETAMINOPHEN 5-325 MG PO TABS: 1.0000 | ORAL_TABLET | Freq: Four times a day (QID) | ORAL | Status: DC | PRN

## 2013-04-27 NOTE — Progress Notes (Signed)
Pt. Was discharged home. She was given her discharge instructions, prescriptions, and all questions were answered.  She was transported home by her husband.

## 2013-04-27 NOTE — Discharge Summary (Signed)
Physician Discharge Summary  Bianca Logan ZOX:[id number] DOB: Nov 09, 1950 DOA: 04/26/2013  PCP: Cain Saupe, MD  Admit date: 04/26/2013 Discharge date: 04/27/2013  Recommendations for Outpatient Follow-up:   Plan for outpatient ERCP and probable stent placement, EUS on Tuesday. Pt understands where to follow up.  Discharge Diagnoses:  Active Problems:   Malignant obstructive jaundice   Protein-calorie malnutrition, severe   Discharge Condition: medically stable for discharge   Diet recommendation: as tolerated  History of present illness:  62 year old female with no significant past medical history who was directly admitted from PCP office for further evaluation and management of new onset jaundice, abnormal liver enzymes, abnormal CT findings of the abdomen. Patient reported over the past 1 week she noted more nausea, loss of appetite and has noticed that you urine changed color to gold and stool appeared to be more yellowish. Patient also noted her skin changed color to more yellowish tone. She has never had similar problem in the past. Blood work from PCP office CBC WNL, BMP WNL, ALP 481, AST 113, Alt 268, TBili 6.1   Assessment and Plan:   Principal Problem: Obstructive jaundice in the setting of possible metastatic pancreatic cancer - appreciate GI and oncology input - patient know where nad when to follow up for future work up - MRCP with findings consistent with pancreas obstructing the common bile duct and causing the intra and extrahepatic biliary dilatation. There is also bulky celiac axis and retroperitoneal lymphadenopathy and liver metastasis.  Metastatic bone disease.  - plan as above per GI and oncology   Active Problems: Abnormal liver enzymes  - related to principal problem  - evaluation as noted above   Code Status: Full  Family Communication: Pt and husband at bedside  Disposition Plan: d/c home today, prescriptions provided  Signed:  Manson Passey,  MD  [facility] 04/27/2013, 10:06 AM  Pager #: [phone]  Procedures:  None   Consultations:  GI  Discharge Exam: Filed Vitals:   04/27/13 0423  BP: 114/50  Pulse: 85  Temp: 99 F (37.2 C)  Resp: 20   Filed Vitals:   04/26/13 1059 04/26/13 1419 04/26/13 2036 04/27/13 0423  BP: 109/57 117/62 108/76 114/50  Pulse: 81 75 91 85  Temp: 98.7 F (37.1 C) 98.8 F (37.1 C) 98.6 F (37 C) 99 F (37.2 C)  TempSrc: Oral Oral Oral Oral  Resp: 20 20 20 20   Height: 5\' 4"  (1.626 m)     Weight: 63.957 kg (141 lb)     SpO2: 100% 100% 100% 100%    General: Pt is alert, follows commands appropriately, jaundiced skin, not in acute distress Cardiovascular: Regular rate and rhythm, S1/S2 +, no murmurs, no rubs, no gallops Respiratory: Clear to auscultation bilaterally, no wheezing, no crackles, no rhonchi Abdominal: Soft, non tender, non distended, bowel sounds +, no guarding Extremities: no edema, no cyanosis, pulses palpable bilaterally DP and PT Neuro: Grossly nonfocal  Discharge Instructions  Discharge Orders   Future Orders Complete By Expires   Call MD for:  difficulty breathing, headache or visual disturbances  As directed    Call MD for:  persistant dizziness or light-headedness  As directed    Call MD for:  persistant nausea and vomiting  As directed    Call MD for:  severe uncontrolled pain  As directed    Diet - low sodium heart healthy  As directed    Increase activity slowly  As directed  Medication List    STOP taking these medications       sulfamethoxazole-trimethoprim 800-160 MG per tablet  Commonly known as:  BACTRIM DS      TAKE these medications       calcium-vitamin D 500-200 MG-UNIT per tablet  Commonly known as:  OSCAL WITH D  Take 1 tablet by mouth daily with breakfast.     feeding supplement (ENSURE COMPLETE) Liqd  Take 237 mLs by mouth as needed (for additional nutrition).     HYDROcodone-acetaminophen 5-325 MG per tablet   Commonly known as:  NORCO/VICODIN  Take 1 tablet by mouth every 6 (six) hours as needed for pain.     multivitamin with minerals tablet  Take 1 tablet by mouth daily.     omeprazole 20 MG capsule  Commonly known as:  PRILOSEC  Take 1 capsule (20 mg total) by mouth 2 (two) times daily.     ondansetron 4 MG tablet  Commonly known as:  ZOFRAN  Take 1 tablet (4 mg total) by mouth every 6 (six) hours as needed for nausea.     oxyCODONE 5 MG immediate release tablet  Commonly known as:  Oxy IR/ROXICODONE  Take 1 tablet (5 mg total) by mouth every 4 (four) hours as needed.          The results of significant diagnostics from this hospitalization (including imaging, microbiology, ancillary and laboratory) are listed below for reference.    Significant Diagnostic Studies: Ct Abdomen W Contrast 04/25/2013 IMPRESSION: Solid appearing ill-defined lesions in the right lobe of the liver as described above concerning for metastases.  Marked intrahepatic and proximal extrahepatic biliary ductal dilatation. The common bile duct cannot be visualized below the superior aspect of the pancreatic head. The bili obstruction may be related to compression from enlarged porta hepatis lymph nodes. Cannot exclude a Klatskin tumor other central biliary malignancy. No definite pancreatic head obstructing mass noted.  There is a focal gallbladder wall thickening, most likely related to cholecystitis. Cannot completely exclude gallbladder cancer.  Cholelithiasis.  Bulky retroperitoneal and porta hepatis adenopathy.  Recommend MRI of the abdomen without and with contrast and MRCP for further evaluation.  These results were discussed with Dr. Jillyn Hidden at the time of interpretation.     Mr 3d Recon At Scanner 04/27/2013   IMPRESSION: Infiltrating mass suspected in the head and uncinate process region of the pancreas obstructing the common bile duct and causing the intra and extrahepatic biliary dilatation. There is also  bulky celiac axis and retroperitoneal lymphadenopathy and liver metastasis.  Metastatic bone disease.     Mr Abd W/wo Cm/mrcp 04/27/2013    IMPRESSION: Infiltrating mass suspected in the head and uncinate process region of the pancreas obstructing the common bile duct and causing the intra and extrahepatic biliary dilatation. There is also bulky celiac axis and retroperitoneal lymphadenopathy and liver metastasis.  Metastatic bone disease.      Microbiology: No results found for this or any previous visit (from the past 240 hour(s)).   Labs: Basic Metabolic Panel:  Recent Labs Lab 04/26/13 1540 04/27/13 0415  NA 135 135  K 3.3* 4.2  CL 98 99  CO2 27 27  GLUCOSE 126* 109*  BUN 11 11  CREATININE 0.71 0.68  CALCIUM 9.7 9.1  MG 2.1  --   PHOS 3.3  --    Liver Function Tests:  Recent Labs Lab 04/26/13 1540 04/27/13 0415  AST 84* 74*  ALT 229* 199*  ALKPHOS 554* 508*  BILITOT 7.5* 6.3*  PROT 7.2 6.6  ALBUMIN 3.4* 3.1*   No results found for this basename: LIPASE, AMYLASE,  in the last 168 hours No results found for this basename: AMMONIA,  in the last 168 hours CBC:  Recent Labs Lab 04/26/13 1540 04/27/13 0415  WBC 10.6* 10.1  HGB 12.4 12.5  HCT 37.0 37.3  MCV 85.3 85.4  PLT 502* 435*   Cardiac Enzymes: No results found for this basename: CKTOTAL, CKMB, CKMBINDEX, TROPONINI,  in the last 168 hours BNP: BNP (last 3 results) No results found for this basename: PROBNP,  in the last 8760 hours CBG: No results found for this basename: GLUCAP,  in the last 168 hours  Time coordinating discharge: Over 30 minutes

## 2013-04-29 ENCOUNTER — Encounter (HOSPITAL_COMMUNITY): Payer: Self-pay | Admitting: *Deleted

## 2013-04-29 ENCOUNTER — Encounter (HOSPITAL_COMMUNITY): Payer: Self-pay | Admitting: Pharmacy Technician

## 2013-05-01 ENCOUNTER — Other Ambulatory Visit: Payer: Self-pay | Admitting: Gastroenterology

## 2013-05-01 NOTE — Addendum Note (Signed)
Addended by: Willis Modena on: 05/01/2013 04:24 PM   Modules accepted: Orders

## 2013-05-02 ENCOUNTER — Observation Stay (HOSPITAL_COMMUNITY)
Admission: RE | Admit: 2013-05-02 | Discharge: 2013-05-03 | Disposition: A | Discharge status: Home / Self Care | Payer: BC Managed Care – PPO | Source: Ambulatory Visit | Attending: Gastroenterology | Admitting: Gastroenterology

## 2013-05-02 ENCOUNTER — Ambulatory Visit (HOSPITAL_COMMUNITY): Payer: BC Managed Care – PPO

## 2013-05-02 ENCOUNTER — Ambulatory Visit (HOSPITAL_COMMUNITY): Payer: BC Managed Care – PPO | Admitting: Certified Registered Nurse Anesthetist

## 2013-05-02 ENCOUNTER — Encounter (HOSPITAL_COMMUNITY): Payer: BC Managed Care – PPO | Admitting: Certified Registered Nurse Anesthetist

## 2013-05-02 ENCOUNTER — Encounter (HOSPITAL_COMMUNITY): Payer: Self-pay | Admitting: Certified Registered Nurse Anesthetist

## 2013-05-02 ENCOUNTER — Encounter (HOSPITAL_COMMUNITY)
Admission: RE | Disposition: A | Discharge status: Home / Self Care | Payer: Self-pay | Source: Ambulatory Visit | Attending: Gastroenterology

## 2013-05-02 DIAGNOSIS — C801 Malignant (primary) neoplasm, unspecified: Secondary | ICD-10-CM

## 2013-05-02 DIAGNOSIS — K219 Gastro-esophageal reflux disease without esophagitis: Secondary | ICD-10-CM | POA: Insufficient documentation

## 2013-05-02 DIAGNOSIS — K8020 Calculus of gallbladder without cholecystitis without obstruction: Secondary | ICD-10-CM | POA: Insufficient documentation

## 2013-05-02 DIAGNOSIS — Z79899 Other long term (current) drug therapy: Secondary | ICD-10-CM | POA: Insufficient documentation

## 2013-05-02 DIAGNOSIS — C240 Malignant neoplasm of extrahepatic bile duct: Principal | ICD-10-CM | POA: Insufficient documentation

## 2013-05-02 DIAGNOSIS — R599 Enlarged lymph nodes, unspecified: Secondary | ICD-10-CM | POA: Insufficient documentation

## 2013-05-02 HISTORY — PX: ERCP: SHX5425

## 2013-05-02 HISTORY — DX: Gastro-esophageal reflux disease without esophagitis: K21.9

## 2013-05-02 HISTORY — PX: BILIARY STENT PLACEMENT: SHX5538

## 2013-05-02 HISTORY — DX: Malignant (primary) neoplasm, unspecified: C80.1

## 2013-05-02 HISTORY — PX: EUS: SHX5427

## 2013-05-02 LAB — COMPREHENSIVE METABOLIC PANEL
Alkaline Phosphatase: 443 U/L — ABNORMAL HIGH (ref 39–117)
BUN: 9 mg/dL (ref 6–23)
CO2: 28 mEq/L (ref 19–32)
Chloride: 93 mEq/L — ABNORMAL LOW (ref 96–112)
GFR calc Af Amer: >90 mL/min (ref >90)
GFR calc non Af Amer: >90 mL/min (ref >90)
Glucose, Bld: 107 mg/dL — ABNORMAL HIGH (ref 70–99)
Potassium: 3.7 mEq/L (ref 3.5–5.1)
Sodium: 131 mEq/L — ABNORMAL LOW (ref 135–145)
Total Bilirubin: 10.2 mg/dL — ABNORMAL HIGH (ref 0.3–1.2)

## 2013-05-02 LAB — CBC WITH DIFFERENTIAL/PLATELET
Eosinophils Absolute: 0.1 10*3/uL (ref 0.0–0.7)
HCT: 34 % — ABNORMAL LOW (ref 36.0–46.0)
Hemoglobin: 11.8 g/dL — ABNORMAL LOW (ref 12.0–15.0)
Lymphs Abs: 1.9 10*3/uL (ref 0.7–4.0)
MCH: 29.3 pg (ref 26.0–34.0)
MCHC: 34.7 g/dL (ref 30.0–36.0)
Monocytes Absolute: 1.3 10*3/uL — ABNORMAL HIGH (ref 0.1–1.0)
Monocytes Relative: 10 % (ref 3–12)
Neutro Abs: 9.6 10*3/uL — ABNORMAL HIGH (ref 1.7–7.7)
Neutrophils Relative %: 74 % (ref 43–77)
Platelets: 493 10*3/uL — ABNORMAL HIGH (ref 150–400)
RBC: 4.03 MIL/uL (ref 3.87–5.11)
WBC: 13 10*3/uL — ABNORMAL HIGH (ref 4.0–10.5)

## 2013-05-02 SURGERY — ERCP, WITH INTERVENTION IF INDICATED
Anesthesia: General

## 2013-05-02 MED ORDER — LACTATED RINGERS IV SOLN
INTRAVENOUS | Status: DC
  Administered 2013-05-02 (×3): via INTRAVENOUS

## 2013-05-02 MED ORDER — ROCURONIUM BROMIDE 100 MG/10ML IV SOLN
INTRAVENOUS | Status: DC | PRN
  Administered 2013-05-02: 20 mg via INTRAVENOUS

## 2013-05-02 MED ORDER — LACTATED RINGERS IV SOLN: INTRAVENOUS | Status: DC

## 2013-05-02 MED ORDER — SODIUM CHLORIDE 0.9 % IV SOLN
INTRAVENOUS | Status: DC
  Administered 2013-05-02: 20 mL/h via INTRAVENOUS

## 2013-05-02 MED ORDER — HYDROMORPHONE HCL PF 1 MG/ML IJ SOLN
INTRAMUSCULAR | Status: AC
  Filled 2013-05-02: qty 1

## 2013-05-02 MED ORDER — SODIUM CHLORIDE 0.9 % IV SOLN
INTRAVENOUS | Status: DC | PRN
  Administered 2013-05-02: 13:00:00

## 2013-05-02 MED ORDER — PROPOFOL 10 MG/ML IV BOLUS
INTRAVENOUS | Status: DC | PRN
  Administered 2013-05-02: 140 mg via INTRAVENOUS

## 2013-05-02 MED ORDER — FENTANYL CITRATE 0.05 MG/ML IJ SOLN
INTRAMUSCULAR | Status: DC | PRN
  Administered 2013-05-02 (×2): 50 ug via INTRAVENOUS
  Administered 2013-05-02: 100 ug via INTRAVENOUS

## 2013-05-02 MED ORDER — HYDROMORPHONE HCL PF 1 MG/ML IJ SOLN
1.0000 mg | INTRAMUSCULAR | Status: DC | PRN
  Administered 2013-05-02 – 2013-05-03 (×3): 1 mg via INTRAVENOUS
  Filled 2013-05-02 (×3): qty 1

## 2013-05-02 MED ORDER — ZOLPIDEM TARTRATE 5 MG PO TABS
5.0000 mg | ORAL_TABLET | Freq: Every evening | ORAL | Status: DC | PRN
  Administered 2013-05-02: 5 mg via ORAL
  Filled 2013-05-02: qty 1

## 2013-05-02 MED ORDER — SUCCINYLCHOLINE CHLORIDE 20 MG/ML IJ SOLN
INTRAMUSCULAR | Status: DC | PRN
  Administered 2013-05-02: 100 mg via INTRAVENOUS

## 2013-05-02 MED ORDER — ONDANSETRON HCL 4 MG/2ML IJ SOLN: 4.0000 mg | Freq: Four times a day (QID) | INTRAMUSCULAR | Status: DC | PRN

## 2013-05-02 MED ORDER — CIPROFLOXACIN IN D5W 400 MG/200ML IV SOLN
400.0000 mg | Freq: Once | INTRAVENOUS | Status: AC
  Administered 2013-05-02: 400 mg via INTRAVENOUS

## 2013-05-02 MED ORDER — GLUCAGON HCL (RDNA) 1 MG IJ SOLR
INTRAMUSCULAR | Status: AC
  Filled 2013-05-02: qty 2

## 2013-05-02 MED ORDER — HYDROMORPHONE HCL PF 1 MG/ML IJ SOLN
0.2500 mg | INTRAMUSCULAR | Status: DC | PRN
  Administered 2013-05-02: 0.5 mg via INTRAVENOUS

## 2013-05-02 MED ORDER — ONDANSETRON HCL 4 MG/2ML IJ SOLN
INTRAMUSCULAR | Status: DC | PRN
  Administered 2013-05-02: 4 mg via INTRAVENOUS

## 2013-05-02 MED ORDER — CIPROFLOXACIN IN D5W 400 MG/200ML IV SOLN
INTRAVENOUS | Status: AC
  Filled 2013-05-02: qty 200

## 2013-05-02 MED ORDER — IOHEXOL 300 MG/ML  SOLN
25.0000 mL | Freq: Once | INTRAMUSCULAR | Status: AC | PRN
  Administered 2013-05-02: 25 mL

## 2013-05-02 MED ORDER — OXYCODONE HCL 5 MG PO TABS: 5.0000 mg | ORAL_TABLET | ORAL | Status: DC | PRN

## 2013-05-02 MED ORDER — GLUCAGON HCL (RDNA) 1 MG IJ SOLR
INTRAMUSCULAR | Status: DC | PRN
  Administered 2013-05-02: .5 mg via INTRAVENOUS

## 2013-05-02 MED ORDER — PROMETHAZINE HCL 25 MG/ML IJ SOLN: 6.2500 mg | INTRAMUSCULAR | Status: DC | PRN

## 2013-05-02 MED ORDER — MIDAZOLAM HCL 5 MG/5ML IJ SOLN
INTRAMUSCULAR | Status: DC | PRN
  Administered 2013-05-02: 1 mg via INTRAVENOUS
  Administered 2013-05-02: 2 mg via INTRAVENOUS
  Administered 2013-05-02: 1 mg via INTRAVENOUS

## 2013-05-02 NOTE — Procedures (Signed)
Technically successful [place] and fluoroscopic guided placement of left biliary approach PBD with end coiled and locked in the duodenum. PBD connected to gravity bag. No immediate post procedural complications.

## 2013-05-02 NOTE — H&P (Signed)
[facility] Observation note  Chief Complaint: obstructive jaundice  HPI: Bianca Logan is an 62 y.o. female with obstructive jaundice and studies most consistent with metastatic cholangiocarcinoma.  Had endoscopic ultrasound with biopsies today, confirming adenocarcinoma within the bile duct.  Failed ERCP, leading to percutaneous internal/external biliary drain today by Interventional Radiology.  She has some tenderness around the drain site, otherwise doing ok.  Past Medical History  Diagnosis Date  . 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 GERD (gastroesophageal reflux disease)     Past Surgical History  Procedure Laterality Date  . Cyst removed from left breast  01/1999    Medications Prior to Admission  Medication Sig Dispense Refill  . calcium-vitamin D (OSCAL WITH D) 500-200 MG-UNIT per tablet Take 1 tablet by mouth daily with breakfast.      . HYDROcodone-acetaminophen (NORCO/VICODIN) 5-325 MG per tablet Take 1 tablet by mouth every 6 (six) hours as needed for pain.      Marland Kitchen lactulose (CHRONULAC) 10 GM/15ML solution Take 20 g by mouth 2 (two) times daily as needed.      . Multiple Vitamin (MULTIVITAMIN WITH MINERALS) TABS tablet Take 1 tablet by mouth daily.      Marland Kitchen omeprazole (PRILOSEC) 20 MG capsule Take 20 mg by mouth 2 (two) times daily.      . ondansetron (ZOFRAN) 4 MG tablet Take 4 mg by mouth every 8 (eight) hours as needed for nausea.      . OxyCODONE HCl, Abuse Deter, 5 MG TABA Take 5 mg by mouth every 4 (four) hours as needed (pain).        Allergies: No Known Allergies  Family History  Problem Relation Age of Onset  . Cancer Mother   . Heart attack Father   . Anuerysm Sister   . Obesity Brother     Social History:  reports that she quit smoking about 28 years ago. Her smoking use included Cigarettes. She started smoking about 28 years ago. She has a 18 pack-year smoking history. She has never used smokeless tobacco. She reports that she drinks  about 2.4 ounces of alcohol per week. She reports that she does not use illicit drugs.   ROS: As per HPI, all others negative   Blood pressure 137/73, pulse 75, temperature 98.4 F (36.9 C), temperature source Oral, resp. rate 16, height 5\' 4"  (1.626 m), weight 62.596 kg (138 lb), SpO2 98.00%. General appearance: NAD Eyes: Scleral icterus Resp: CTA Cardio:  RRR GI: Mild tenderness about biliary drain Extremities: No edema Skin: Jaundiced  No results found for this or any previous visit (from the past 48 hour(s)). Dg Ercp Biliary & Pancreatic Ducts  05/02/2013   CLINICAL DATA:  Obstructive jaundice.  EXAM: ERCP  TECHNIQUE: Multiple spot images obtained with the fluoroscopic device and submitted for interpretation post-procedure.  COMPARISON:  MRI 04/26/2013  FINDINGS: Four fluoroscopic spot images demonstrate cannulation of the common bile duct. There is a string sign consistent with an obstructing mass  IMPRESSION: Severe narrowing/ obstruction of the common bile duct.  These images were submitted for radiologic interpretation only. Please see the procedural report for the amount of contrast and the fluoroscopy time utilized.   Electronically Signed   By: Loralie Champagne M.D.   On: 05/02/2013 13:13    Assessment/Plan  1.  Obstructive jaundice with overall clinical picture most consistent with metastatic cholangiocarcinoma. 2.  Will admit for 23 hour observation. 3.  Will provide analgesics, antiemetics as needed. 4.  Check labs.  5.  Hopefully home tomorrow, with outpatient oncology follow-up with Dr. Cyndie Chime.  Freddy Jaksch 05/02/2013, 4:39 PM

## 2013-05-02 NOTE — Op Note (Signed)
[facility] [address] [place], [address]   ENDOSCOPIC ULTRASOUND PROCEDURE REPORT  PATIENT: Bianca, Logan  MR#: [id number] BIRTHDATE: 1950-08-13  GENDER: Female ENDOSCOPIST: Willis Modena, MD REFERRED BY:  Cain Saupe, MD PROCEDURE DATE:  05/02/2013 PROCEDURE:   Endoscopic ultrasound with fine needle aspiration  ASA CLASS:      ASA-II INDICATIONS:   Obstructive jaundice MEDICATIONS: General endotracheal anesthesia, Cipro 400 mg IV   DESCRIPTION OF PROCEDURE:   After the risks benefits and alternatives of the procedure were  explained, informed consent was obtained. The patient was then placed in the left, lateral, decubitus postion and IV sedation was administered. Throughout the procedure, the patients blood pressure, pulse and oxygen saturations were monitored continuously.  Under direct visualization, the Pentax EUS Linear [id number]  endoscope was introduced through the mouth  and advanced to the    .  Water was used as necessary to provide an acoustic interface.  Upon completion of the imaging, water was removed and the patient was sent to the recovery room in satisfactory condition.     FINDINGS:      No obvious pancreatic mass seen.  However, there is clear demarcation of soft tissue infiltration into the distal bile duct and the surrounding portions of the head and uncinate pancreas, with intra- and extrahepatic biliary ductal dilatation. The gallbladder is distended with stones.  Shotty small celiac adenopathy seen.  The distal bile duct, in the region of the soft tissue infiltration, was FNA biopsied x 4 (25g x 3, 22g x 1, two with 5cc suction).  Preliminary cytology, reviewed in my presence by Dr. Frederica Kuster, worrisome for poorly differentiated adenocarcinoma.   IMPRESSION:     As above.  Overall constellation of findings most supportive of metastatic cholangiocarcinoma.  RECOMMENDATIONS:     1.  Watch for potential complications  of procedure. 2.  Await final cytology results. 3.  Proceed with ERCP today for biliary stenting.   _______________________________ eSignedWillis Modena, MD 05/02/2013 11:08 AM   CC:

## 2013-05-02 NOTE — H&P (View-Only) (Signed)
Reason for Consult: Obstructive jaundice abnormal CT Referring Physician: Coline Calkin is an 62 y.o. female.  HPI: Patient without any previous GI complaints until about a week or 2 ago with some decreased appetite and nausea and change in bowel habits and darkening urine who was found to be jaundiced and CT showed probable hilar obstruction and she was admitted for further workup and plans. She did have a partial colonoscopy and virtual colonoscopy in the past by one of my partners and did have an abnormal mole removed and the pathology is on the computer but otherwise she has been very healthy in fact was planning a half marathon in November and she has not had any fever or vomiting and no other specific complaints  Past Medical History  Diagnosis Date  . Medical history non-contributory   . 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 Cyst removed from left breast  01/1999    Family History  Problem Relation Age of Onset  . Cancer Mother   . Heart attack Father   . Anuerysm Sister   . Obesity Brother     Social History:  reports that she quit smoking about 28 years ago. Her smoking use included Cigarettes. She started smoking about 28 years ago. She smoked 0.00 packs per day. She has never used smokeless tobacco. She reports that she drinks about 2.4 ounces of alcohol per week. She reports that she does not use illicit drugs.  Allergies: No Known Allergies  Medications: I have reviewed the patient's current medications.  Results for orders placed during the hospital encounter of 04/26/13 (from the past 48 hour(s))  COMPREHENSIVE METABOLIC PANEL     Status: Abnormal   Collection Time    04/26/13  3:40 PM      Result Value Range   Sodium 135  135 - 145 mEq/L   Potassium 3.3 (*) 3.5 - 5.1 mEq/L   Chloride 98  96 - 112 mEq/L   CO2 27  19 - 32 mEq/L   Glucose, Bld 126 (*) 70 - 99 mg/dL   BUN 11  6 - 23 mg/dL    Creatinine, Ser 0.45  0.50 - 1.10 mg/dL   Calcium 9.7  8.4 - 40.9 mg/dL   Total Protein 7.2  6.0 - 8.3 g/dL   Albumin 3.4 (*) 3.5 - 5.2 g/dL   AST 84 (*) 0 - 37 U/L   ALT 229 (*) 0 - 35 U/L   Alkaline Phosphatase 554 (*) 39 - 117 U/L   Total Bilirubin 7.5 (*) 0.3 - 1.2 mg/dL   GFR calc non Af Amer >90  >90 mL/min   GFR calc Af Amer >90  >90 mL/min   Comment: (NOTE)     The eGFR has been calculated using the CKD EPI equation.     This calculation has not been validated in all clinical situations.     eGFR's persistently <90 mL/min signify possible Chronic Kidney     Disease.  MAGNESIUM     Status: None   Collection Time    04/26/13  3:40 PM      Result Value Range   Magnesium 2.1  1.5 - 2.5 mg/dL  PHOSPHORUS     Status: None   Collection Time    04/26/13  3:40 PM      Result Value Range   Phosphorus 3.3  2.3 - 4.6 mg/dL  CBC     Status:  Abnormal   Collection Time    04/26/13  3:40 PM      Result Value Range   WBC 10.6 (*) 4.0 - 10.5 K/uL   RBC 4.34  3.87 - 5.11 MIL/uL   Hemoglobin 12.4  12.0 - 15.0 g/dL   HCT 16.1  09.6 - 04.5 %   MCV 85.3  78.0 - 100.0 fL   MCH 28.6  26.0 - 34.0 pg   MCHC 33.5  30.0 - 36.0 g/dL   RDW 40.9  81.1 - 91.4 %   Platelets 502 (*) 150 - 400 K/uL    Ct Abdomen W Contrast  04/25/2013   CLINICAL DATA:  Upper abdominal pain, nausea, decreased appetite. Elevated LFTs.  EXAM: CT ABDOMEN WITH CONTRAST  TECHNIQUE: Multidetector CT imaging of the abdomen was performed using the standard protocol following bolus administration of intravenous contrast.  CONTRAST:  OMNIPAQUE IOHEXOL 300 MG/ML  SOLN  COMPARISON:  CT 12/15/2008  FINDINGS: Lung bases are clear. No effusions. Heart is normal size.  There is an in their scratch head there is an irregular solid-appearing lesion in the right hepatic dome measuring 18 mm on image 7 of series 2. Inferiorly in the right hepatic lobe, a 2nd solid-appearing lesion is noted measuring 2.3 cm on image 38.  Multiple  layering gallstones noted within the gallbladder. There is gallbladder wall thickening measuring up to 8 mm near the fundus of the gallbladder. Significant intrahepatic biliary ductal dilatation. The common bile duct is difficult to visualize in the pancreas. The common bile duct is dilated in the porta hepatis, within not visualized below the superior aspect of the pancreatic head.  Spleen, adrenals and kidneys are unremarkable. No visible focal pancreatic abnormality.  There is retroperitoneal adenopathy. Bulky lymph nodes bilaterally on image 25. Aortocaval node on image 25 measures 2.6 x 1.9 cm. Mildly prominent central mesenteric lymph nodes. Ill-defined porta hepatis adenopathy, difficult to measure.  Moderate stool throughout the colon. Visualized small bowel is decompressed and unremarkable. Aorta is normal caliber.  Mild to moderate compression fracture through the superior endplate of L1. Tiny sclerotic focus within the inferior anterior aspect of L4, nonspecific. Cannot exclude a small sclerotic metastasis.  IMPRESSION: Solid appearing ill-defined lesions in the right lobe of the liver as described above concerning for metastases.  Marked intrahepatic and proximal extrahepatic biliary ductal dilatation. The common bile duct cannot be visualized below the superior aspect of the pancreatic head. The bili obstruction may be related to compression from enlarged porta hepatis lymph nodes. Cannot exclude a Klatskin tumor other central biliary malignancy. No definite pancreatic head obstructing mass noted.  There is a focal gallbladder wall thickening, most likely related to cholecystitis. Cannot completely exclude gallbladder cancer.  Cholelithiasis.  Bulky retroperitoneal and porta hepatis adenopathy.  Recommend MRI of the abdomen without and with contrast and MRCP for further evaluation.  These results were discussed with Dr. Jillyn Hidden at the time of interpretation.   Electronically Signed   By: Charlett Nose M.D.    On: 04/25/2013 16:47    ROS negative except above Blood pressure 117/62, pulse 75, temperature 98.8 F (37.1 C), temperature source Oral, resp. rate 20, height 5\' 4"  (1.626 m), weight 63.957 kg (141 lb), SpO2 100.00%. Physical Exam vital signs stable afebrile no acute distress patient not examined today looks well lying comfortable in her bed office labs and hospital computer and CT reviewed  Assessment/Plan: Obstructive jaundice probably due to hilar mass questionable etiology Plan: I've discussed her  case with the oncologist as well as my partner Dr. Dulce Sellar and we will proceed with an MRCP today and then she can go home and return for an outpatient ERCP and probable stent placement and hopefully brushings as well and possible EUS on Tuesday and I have discussed the risks benefits methods of both procedure with both the husband and the wife and scheduled the above procedure with the endoscopy unit and they will call us sooner when necessary in the warnings were discussed  [facility] E 04/26/2013, 4:39 PM

## 2013-05-02 NOTE — Interval H&P Note (Signed)
History and Physical Interval Note:  05/02/2013 9:57 AM  Bianca Logan  has presented today for surgery, with the diagnosis of stent placement   The various methods of treatment have been discussed with the patient and family. After consideration of risks, benefits and other options for treatment, the patient has consented to  Procedure(s): ENDOSCOPIC RETROGRADE CHOLANGIOPANCREATOGRAPHY (ERCP) (N/A) BILIARY STENT PLACEMENT (N/A) FULL UPPER ENDOSCOPIC ULTRASOUND (EUS) RADIAL (N/A) as a surgical intervention .  The patient's history has been reviewed, patient examined, no change in status, stable for surgery.  I have reviewed the patient's chart and labs.  Questions were answered to the patient's satisfaction.     Zale Marcotte M  Assessment:  1.  Obstructive jaundice.  Radiographic concern for pancreatic head mass with regional adenopathy and liver/bony metastases.  Plan:  1.  Endoscopic ultrasound with possible biopsies (fine needle aspiration, FNA). 2.  Risks (bleeding, infection, bowel perforation that could require surgery, sedation-related changes in cardiopulmonary systems), benefits (identification and possible treatment of source of symptoms, exclusion of certain causes of symptoms), and alternatives (watchful waiting, radiographic imaging studies, empiric medical treatment) of upper endoscopy with ultrasound and possible biopsies (EUS +/- FNA) were explained to patient/family in detail and patient wishes to proceed. 3.  Endoscopic retrograde cholangiopancreatography with hopeful biliary stent placement. 4.  Risks (up to and including bleeding, infection, perforation, pancreatitis that can be complicated by infected necrosis and death), benefits (removal of stones, alleviating blockage, decreasing risk of cholangitis or choledocholithiasis-related pancreatitis), and alternatives (watchful waiting, percutaneous transhepatic cholangiography) of ERCP were explained to patient/family in  detail and patient elects to proceed.

## 2013-05-02 NOTE — Transfer of Care (Signed)
Immediate Anesthesia Transfer of Care Note  Patient: LARRA CRUNKLETON  Procedure(s) Performed: Procedure(s): ENDOSCOPIC RETROGRADE CHOLANGIOPANCREATOGRAPHY (ERCP) (N/A) BILIARY STENT PLACEMENT (N/A) FULL UPPER ENDOSCOPIC ULTRASOUND (EUS) RADIAL (N/A)  Patient Location: PACU  Anesthesia Type:General  Level of Consciousness: awake and alert   Airway & Oxygen Therapy: Patient Spontanous Breathing and Patient connected to face mask oxygen  Post-op Assessment: Report given to PACU RN and Post -op Vital signs reviewed and stable  Post vital signs: Reviewed and stable  Complications: No apparent anesthesia complications

## 2013-05-02 NOTE — Progress Notes (Signed)
Assisted out of bed, ambulated to bathroom, passed gas but no bowel movement.  Tolerated well, returned to bed.

## 2013-05-02 NOTE — Anesthesia Preprocedure Evaluation (Signed)
Anesthesia Evaluation  Patient identified by MRN, date of birth, ID band Patient awake    Reviewed: Allergy & Precautions, H&P , NPO status , Patient's Chart, lab work & pertinent test results  Airway Mallampati: II TM Distance: >3 FB Neck ROM: Full    Dental  (+) Teeth Intact and Dental Advisory Given   Pulmonary neg pulmonary ROS,  breath sounds clear to auscultation  Pulmonary exam normal       Cardiovascular negative cardio ROS  Rhythm:Regular Rate:Normal     Neuro/Psych negative neurological ROS  negative psych ROS   GI/Hepatic Neg liver ROS, GERD-  ,  Endo/Other  negative endocrine ROS  Renal/GU negative Renal ROS  negative genitourinary   Musculoskeletal negative musculoskeletal ROS (+)   Abdominal   Peds  Hematology negative hematology ROS (+)   Anesthesia Other Findings   Reproductive/Obstetrics negative OB ROS                           Anesthesia Physical Anesthesia Plan  ASA: I  Anesthesia Plan: General   Post-op Pain Management:    Induction: Intravenous  Airway Management Planned: Oral ETT  Additional Equipment:   Intra-op Plan:   Post-operative Plan: Extubation in OR  Informed Consent:   Plan Discussed with: Anesthesiologist  Anesthesia Plan Comments:         Anesthesia Quick Evaluation

## 2013-05-02 NOTE — Progress Notes (Signed)
Patient received from endoscopy via stretcher, alert and oriented, skin jaundiced.    Left abdominal biliary drain in place to drainage bag, foley catheter in place to straight drainage.  IV in place left hand with infusing fluids.  Instructed patient regarding NPO status except for ice chips, need for assistance if she needs to get out of bed

## 2013-05-03 ENCOUNTER — Telehealth: Payer: Self-pay | Admitting: *Deleted

## 2013-05-03 ENCOUNTER — Encounter (HOSPITAL_COMMUNITY): Payer: Self-pay | Admitting: Gastroenterology

## 2013-05-03 LAB — CBC WITH DIFFERENTIAL/PLATELET
Basophils Absolute: 0.1 10*3/uL (ref 0.0–0.1)
Basophils Relative: 1 % (ref 0–1)
Eosinophils Relative: 1 % (ref 0–5)
HCT: 35.6 % — ABNORMAL LOW (ref 36.0–46.0)
Hemoglobin: 11.9 g/dL — ABNORMAL LOW (ref 12.0–15.0)
MCH: 28.2 pg (ref 26.0–34.0)
MCHC: 33.4 g/dL (ref 30.0–36.0)
Monocytes Absolute: 1.1 10*3/uL — ABNORMAL HIGH (ref 0.1–1.0)
Monocytes Relative: 10 % (ref 3–12)
Neutro Abs: 8.6 10*3/uL — ABNORMAL HIGH (ref 1.7–7.7)
RDW: 14.2 % (ref 11.5–15.5)

## 2013-05-03 LAB — COMPREHENSIVE METABOLIC PANEL
ALT: 95 U/L — ABNORMAL HIGH (ref 0–35)
AST: 41 U/L — ABNORMAL HIGH (ref 0–37)
Albumin: 2.5 g/dL — ABNORMAL LOW (ref 3.5–5.2)
Calcium: 9.1 mg/dL (ref 8.4–10.5)
GFR calc Af Amer: >90 mL/min (ref >90)
Sodium: 129 mEq/L — ABNORMAL LOW (ref 135–145)
Total Protein: 5.9 g/dL — ABNORMAL LOW (ref 6.0–8.3)

## 2013-05-03 NOTE — Anesthesia Postprocedure Evaluation (Signed)
Anesthesia Post Note  Patient: Bianca Logan  Procedure(s) Performed: Procedure(s) (LRB): ENDOSCOPIC RETROGRADE CHOLANGIOPANCREATOGRAPHY (ERCP) (N/A) BILIARY STENT PLACEMENT (N/A) FULL UPPER ENDOSCOPIC ULTRASOUND (EUS) RADIAL (N/A)  Anesthesia type: General  Patient location: PACU  Post pain: Pain level controlled  Post assessment: Post-op Vital signs reviewed  Last Vitals:  Filed Vitals:   05/03/13 0538  BP: 147/76  Pulse: 73  Temp: 36.5 C  Resp: 17    Post vital signs: Reviewed  Level of consciousness: sedated  Complications: No apparent anesthesia complications

## 2013-05-03 NOTE — Telephone Encounter (Signed)
Spoke with patient by phone and confirmed appointment with Dr. Truett Perna for 05/14/13.  Contact names and numbers were provided.

## 2013-05-03 NOTE — Discharge Summary (Signed)
Physician Discharge Summary  Patient ID: Bianca Logan MRN: [id number] DOB/AGE: Dec 11, 1950 62 y.o.  Admit date: 05/02/2013 Discharge date: 05/03/2013  Admission Diagnoses: obstructive jaundice  Discharge Diagnoses: obstructive jaundice, likely cholangiocarcinoma Active Problems:   * No active hospital problems. *   Discharged Condition: good  Hospital Course: Patient is 62 yo female with progressive jaundice, anorexia, weight loss, biliary ductal dilatation.  Found to have likely cholangiocarcinoma on endoscopic ultrasound.  ERCP was unsuccessful, and patient had percutaneous biliary drain placed.  Patient admitted for observation post drain placement.  She is doing well at this time, with a little discomfort at drain site, otherwise doing ok.  Consults: Interventional Radiology  Significant Diagnostic Studies: Endoscopic ultrasound; Percutaneous biliary drain placement  Discharge Exam: Blood pressure 147/76, pulse 73, temperature 97.7 F (36.5 C), temperature source Oral, resp. rate 17, height 5\' 4"  (1.626 m), weight 62.596 kg (138 lb), SpO2 97.00%. GEN:  NAD, less jaundiced-appearing  Disposition: 01-Home or Self Care   Future Appointments Provider Department Dept Phone   05/14/2013 1:30 PM [facility] [phone]   05/14/2013 2:00 PM Ladene Artist, MD [facility] [phone]       Medication List    ASK your doctor about these medications       calcium-vitamin D 500-200 MG-UNIT per tablet  Commonly known as:  OSCAL WITH D  Take 1 tablet by mouth daily with breakfast.     HYDROcodone-acetaminophen 5-325 MG per tablet  Commonly known as:  NORCO/VICODIN  Take 1 tablet by mouth every 6 (six) hours as needed for pain.     lactulose 10 GM/15ML solution  Commonly known as:  CHRONULAC  Take 20 g by mouth 2 (two) times daily as needed.     multivitamin with minerals Tabs  tablet  Take 1 tablet by mouth daily.     omeprazole 20 MG capsule  Commonly known as:  PRILOSEC  Take 20 mg by mouth 2 (two) times daily.     ondansetron 4 MG tablet  Commonly known as:  ZOFRAN  Take 4 mg by mouth every 8 (eight) hours as needed for nausea.     OxyCODONE HCl (Abuse Deter) 5 MG Taba  Take 5 mg by mouth every 4 (four) hours as needed (pain).       Follow-up: 1.  Patient asked to call Dr. Patsy Lager office next week to arrange outpatient follow-up. 2.  Patient will need to follow-up with interventional radiology pertaining to her biliary drain, with ultimate goal to internalize the drain with indwelling biliary metal wallstent. 3.  Patient can follow-up with [facility] (Dr. Dulce Sellar, [phone]) on as-needed basis.  Patient advised that [facility] will call her next week with her biopsy results.  SignedFreddy Jaksch 05/03/2013, 7:50 AM

## 2013-05-14 ENCOUNTER — Other Ambulatory Visit: Payer: Self-pay | Admitting: Oncology

## 2013-05-14 ENCOUNTER — Ambulatory Visit (HOSPITAL_BASED_OUTPATIENT_CLINIC_OR_DEPARTMENT_OTHER): Payer: BC Managed Care – PPO

## 2013-05-14 ENCOUNTER — Encounter: Payer: Self-pay | Admitting: Oncology

## 2013-05-14 ENCOUNTER — Ambulatory Visit (HOSPITAL_BASED_OUTPATIENT_CLINIC_OR_DEPARTMENT_OTHER): Payer: BC Managed Care – PPO | Admitting: Oncology

## 2013-05-14 ENCOUNTER — Other Ambulatory Visit: Payer: Self-pay | Admitting: *Deleted

## 2013-05-14 ENCOUNTER — Telehealth: Payer: Self-pay | Admitting: Oncology

## 2013-05-14 VITALS — BP 117/67 | HR 106 | Temp 97.4°F | Resp 18 | Ht 64.0 in

## 2013-05-14 DIAGNOSIS — C221 Intrahepatic bile duct carcinoma: Secondary | ICD-10-CM

## 2013-05-14 DIAGNOSIS — C801 Malignant (primary) neoplasm, unspecified: Secondary | ICD-10-CM

## 2013-05-14 DIAGNOSIS — M549 Dorsalgia, unspecified: Secondary | ICD-10-CM

## 2013-05-14 DIAGNOSIS — C7889 Secondary malignant neoplasm of other digestive organs: Secondary | ICD-10-CM

## 2013-05-14 DIAGNOSIS — K838 Other specified diseases of biliary tract: Secondary | ICD-10-CM

## 2013-05-14 MED ORDER — MORPHINE SULFATE 15 MG PO TABS
ORAL_TABLET | ORAL | Status: AC
  Filled 2013-05-14: qty 1

## 2013-05-14 MED ORDER — MORPHINE SULFATE ER 30 MG PO TBCR: 30.0000 mg | EXTENDED_RELEASE_TABLET | Freq: Two times a day (BID) | ORAL | Status: DC

## 2013-05-14 MED ORDER — MORPHINE SULFATE 15 MG PO TABS: 15.0000 mg | ORAL_TABLET | ORAL | Status: DC | PRN

## 2013-05-14 MED ORDER — SENNOSIDES-DOCUSATE SODIUM 8.6-50 MG PO TABS: 1.0000 | ORAL_TABLET | Freq: Two times a day (BID) | ORAL | Status: DC

## 2013-05-14 MED ORDER — MORPHINE SULFATE 15 MG PO TABS
15.0000 mg | ORAL_TABLET | Freq: Once | ORAL | Status: AC
  Administered 2013-05-14: 15 mg via ORAL

## 2013-05-14 MED ORDER — POLYETHYLENE GLYCOL 3350 17 G PO PACK: 17.0000 g | PACK | Freq: Every day | ORAL | Status: DC

## 2013-05-14 NOTE — Progress Notes (Signed)
[facility] New Patient Consult   Referring MD: Dura Mccormack 62 y.o.  Sep 06, 1950    Reason for Referral: Cholangiocarcinoma     HPI: She reports developing low back discomfort, anorexia, and dark urine while visiting family in [place]. The back discomfort initially improved after seeing a chiropractor. She presented to Dr. Jillyn Hidden with jaundice. She was diagnosed with a urinary tract infection. She was noted to have abnormal liver enzymes and she was admitted on 04/26/2013. A CT of the abdomen on 04/25/2013 was remarkable for evidence of liver metastases, significant intrahepatic biliary ductal dilatation, common bile duct dilatation, and bulky retroperitoneal and porta hepatis adenopathy. No visible focal pancreatic abnormality. A tiny sclerotic focus was noted in the inferior aspect of L4 and a compression fracture through the superior endplate of L1 were noted. Gallbladder wall thickening was noted.  An MRCP on 04/26/2013 confirmed an infiltrating ill-defined lesion in the head and uncinate process of the pancreas with bulky celiac and retroperitoneal adenopathy. No pancreatic duct dilatation. Multiple hepatic metastases. Markedly dilated gallbladder. Metastatic bone lesions involving T9, L3, L4, and L5. Intrahepatic biliary dilatation and a dilated common bile duct were noted.  She was referred to Dr. Dulce Sellar and was taken to endoscopic ultrasound on 05/02/2013. No pancreas mass was seen. Soft tissue infiltration into the distal bile duct and surrounding portions of the head and uncinate was noted with intra-aortic extrahepatic biliary ductal dilatation. Small celiac adenopathy was seen. The distal bile duct in the region of the soft tissue infiltration with biopsy. The cytology ([id number]) confirmed malignant cells consistent with adenocarcinoma.  An ERCP was unsuccessful and she was referred to interventional radiology for placement of an external/internal  biliary drain on 05/02/2013. The drain was placed via a dilated duct in the left lobe of the liver. Radiographs confirmed malignant obstruction of the distal aspect of the common bile duct with moderate to severe dilatation of the opacified central biliary tree.  She has noted continued drainage from the external biliary drain. She remains jaundiced. A chemistry panel on 05/09/2013 family bilirubin persistently elevated at 10.9.  Her chief complaint is severe pain in the low back. The pain radiates into the right anterior thigh. The pain is partially relieved with Dilaudid for approximately 4 hours. It is difficult to ambulate secondary to pain.   Past Medical History  Diagnosis Date  . 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 GERD (gastroesophageal reflux disease)     .   G2 P2   .   " Cyst "at the right scalp                                                                        September 2014  Past Surgical History  Procedure Laterality Date  . Cyst removed from left breast  01/1999  . Ercp N/A 05/02/2013    Procedure: ENDOSCOPIC RETROGRADE CHOLANGIOPANCREATOGRAPHY (ERCP);  Surgeon: Willis Modena, MD;  Location: [facility];  Service: Endoscopy;  Laterality: N/A;  . Biliary stent placement N/A 05/02/2013    Procedure: BILIARY STENT PLACEMENT;  Surgeon: Willis Modena, MD;  Location: [facility];  Service: Endoscopy;  Laterality: N/A;  . Eus N/A 05/02/2013    Procedure: FULL UPPER  ENDOSCOPIC ULTRASOUND (EUS) RADIAL;  Surgeon: Willis Modena, MD;  Location: [facility];  Service: Endoscopy;  Laterality: N/A;    Family History  Problem Relation Age of Onset  . Cancer-unknown primary  Mother  8   .  Prostate cancer  Father  51   . Anuerysm Sister   . Obesity Brother     Current outpatient prescriptions:calcium-vitamin D (OSCAL WITH D) 500-200 MG-UNIT per tablet, Take 1 tablet by mouth daily with breakfast., Disp: , Rfl: ;  HYDROmorphone (DILAUDID) 4 MG tablet, Take 4 mg by  mouth every 6 (six) hours as needed., Disp: , Rfl: ;  Multiple Vitamin (MULTIVITAMIN WITH MINERALS) TABS tablet, Take 1 tablet by mouth daily., Disp: , Rfl:  omeprazole (PRILOSEC) 20 MG capsule, Take 20 mg by mouth 2 (two) times daily., Disp: , Rfl: ;  Sodium Chloride Flush (NORMAL SALINE FLUSH) 0.9 % SOLN, Flushes for biliary drain, Disp: , Rfl: ;  ALPRAZolam (XANAX) 0.5 MG tablet, Take 0.25-0.5 mg by mouth 2 (two) times daily as needed. , Disp: , Rfl: ;  morphine (MS CONTIN) 30 MG 12 hr tablet, Take 1 tablet (30 mg total) by mouth every 12 (twelve) hours., Disp: 60 tablet, Rfl: 0 morphine (MSIR) 15 MG tablet, Take 1 tablet (15 mg total) by mouth every 4 (four) hours as needed for severe pain., Disp: 30 tablet, Rfl: 0;  ondansetron (ZOFRAN) 4 MG tablet, Take 4 mg by mouth every 8 (eight) hours as needed for nausea., Disp: , Rfl: ;  polyethylene glycol (MIRALAX) packet, Take 17 g by mouth daily., Disp: 30 each, Rfl: 6 senna-docusate (SENNA S) 8.6-50 MG per tablet, Take 1 tablet by mouth 2 (two) times daily., Disp: 60 tablet, Rfl: 0;  zolpidem (AMBIEN) 5 MG tablet, , Disp: , Rfl:   Allergies: No Known Allergies  Social History: 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 She smokes cigarettes from 1973 08/29/1984. She quit in 1986. She reports social alcohol use. No transfusion history. No risk factor for HIV or hepatitis.  ROS:   Positives include: Hot flashes, low back pain, anorexia, 10 pound weight loss, nausea-improved with Prilosec, constipation, dark urine, jaundice, right leg pain with standing  A complete ROS was otherwise negative.  Physical Exam:  Blood pressure 117/67, pulse 106, temperature 97.4 F (36.3 C), temperature source Oral, resp. rate 18, height 5\' 4"  (1.626 m), weight 0 lb (0 kg).  HEENT: Scleral icterus, oropharynx without visible mass, neck without mass Lungs: Clear bilaterally Cardiac: Regular rate and rhythm Abdomen: No hepatomegaly, upper abdomen external  biliary drain, no spinal megaly, no apparent ascites, nontender  Vascular:  No leg edema Lymph nodes: No cervical, supraclavicular, axillary, or inguinal nodes Neurologic: Alert and oriented, the motor exam appears intact in the upper and lower extremities Skin: Jaundice Musculoskeletal: No spine tenderness   LAB:  CBC  Lab Results  Component Value Date   WBC 11.1* 05/03/2013   HGB 11.9* 05/03/2013   HCT 35.6* 05/03/2013   MCV 84.4 05/03/2013   PLT 474* 05/03/2013     CMP      Component Value Date/Time   NA 129* 05/03/2013 0539   K 3.5 05/03/2013 0539   CL 92* 05/03/2013 0539   CO2 26 05/03/2013 0539   GLUCOSE 100* 05/03/2013 0539   BUN 8 05/03/2013 0539   CREATININE 0.54 05/03/2013 0539   CALCIUM 9.1 05/03/2013 0539   PROT 5.9* 05/03/2013 0539   ALBUMIN 2.5* 05/03/2013 0539   AST 41* 05/03/2013  0539   ALT 95* 05/03/2013 0539   ALKPHOS 457* 05/03/2013 0539   BILITOT 9.7* 05/03/2013 0539   GFRNONAA >90 05/03/2013 0539   GFRAA >90 05/03/2013 0539   05/09/2013-creatinine 0.68, bilirubin 10.9, alkaline phosphatase 377, AST 47, ALT 101, albumin 3.5  Radiology: I reviewed the 04/25/2013 abdomen CT and 04/27/2013 MRI images with Ms. 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 Obstructive jaundice secondary to #1-status post placement of a percutaneous biliary drain on 05/02/2013, the jaundice persists  3. Pain-most likely secondary to bone metastases involving the lumbar spine versus retroperitoneal adenopathy  4. Anorexia/weight loss   Disposition:   Ms. Portilla presents with obstructive jaundice and severe back pain. The clinical presentation is consistent with a diagnosis of metastatic pancreas cancer or cholangiocarcinoma. I discussed the case with Dr. Dulce Sellar.  He saw no pancreas mass on the EUS and the pancreatic duct was not dilated. Tumor was noted in the common bile duct. He feels the clinical presentation is most suggestive of cholangiocarcinoma.  I reviewed the imaging studies and discussed treatment options with Ms. Hai and her husband. She understands no therapy will be curative. We decided to address her pain and the biliary obstruction today. She was given MSIR in the office and the pain improved. She will start MS Contin at a dose of 30 mg every 12 hours and continue MSIR for breakthrough pain.  I contacted Dr. Grace Isaac and he will arrange for a cholangiogram and intervention procedure on 05/16/2013. Prior to this procedure she will undergo CTs of the chest, abdomen, and pelvis for staging and to assess the biliary system.  Ms. Pontarelli will begin a bowel regimen with MiraLAX and Senokot.  Her case will be presented at the GI tumor conference on 05/15/2013. We will make a radiation oncology referral for palliation of pain. Ms. Borbon will return for an office visit on 05/17/2013 for further discussion and to plan systemic therapy. My initial impression is to recommend gemcitabine/oxaliplatin to begin within the next few weeks.  Approximately 60 minutes were spent with the patient today. The majority of the time was used for counseling and coordination of care.  Shawntina Diffee 05/14/2013, 6:19 PM

## 2013-05-14 NOTE — Telephone Encounter (Signed)
gv and printed appt sched and avs for pt for NOV...Marland KitchenMarland Kitchenper Dr. Truett Perna pt did not need barium.Marland KitchenMarland Kitchen

## 2013-05-14 NOTE — Progress Notes (Signed)
Met with Bianca Logan and family. Explained role of nurse navigator. 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 [facility] resources provided to patient, including SW service information.  Offered DME to patient and husband, but they declined.  They are aware that this can be ordered if needed.  Contact names and phone numbers were provided for entire [facility] team.  Teach back method was used.  Will continue to follow as needed.

## 2013-05-14 NOTE — Progress Notes (Signed)
Checked in new patient with no financial issues.she has not been to [place] and she has appt card.

## 2013-05-15 ENCOUNTER — Telehealth: Payer: Self-pay | Admitting: *Deleted

## 2013-05-15 ENCOUNTER — Other Ambulatory Visit: Payer: Self-pay | Admitting: Radiology

## 2013-05-15 ENCOUNTER — Encounter (HOSPITAL_COMMUNITY): Payer: Self-pay | Admitting: Pharmacy Technician

## 2013-05-15 ENCOUNTER — Ambulatory Visit (HOSPITAL_COMMUNITY)
Admission: RE | Admit: 2013-05-15 | Discharge: 2013-05-15 | Disposition: A | Discharge status: Home / Self Care | Payer: BC Managed Care – PPO | Source: Ambulatory Visit | Attending: Oncology | Admitting: Oncology

## 2013-05-15 DIAGNOSIS — C801 Malignant (primary) neoplasm, unspecified: Secondary | ICD-10-CM

## 2013-05-15 DIAGNOSIS — C221 Intrahepatic bile duct carcinoma: Secondary | ICD-10-CM | POA: Insufficient documentation

## 2013-05-15 DIAGNOSIS — M8448XA Pathological fracture, other site, initial encounter for fracture: Secondary | ICD-10-CM | POA: Insufficient documentation

## 2013-05-15 DIAGNOSIS — E279 Disorder of adrenal gland, unspecified: Secondary | ICD-10-CM | POA: Insufficient documentation

## 2013-05-15 DIAGNOSIS — C7951 Secondary malignant neoplasm of bone: Secondary | ICD-10-CM | POA: Insufficient documentation

## 2013-05-15 DIAGNOSIS — K8020 Calculus of gallbladder without cholecystitis without obstruction: Secondary | ICD-10-CM | POA: Insufficient documentation

## 2013-05-15 DIAGNOSIS — R599 Enlarged lymph nodes, unspecified: Secondary | ICD-10-CM | POA: Insufficient documentation

## 2013-05-15 DIAGNOSIS — R911 Solitary pulmonary nodule: Secondary | ICD-10-CM | POA: Insufficient documentation

## 2013-05-15 DIAGNOSIS — C419 Malignant neoplasm of bone and articular cartilage, unspecified: Secondary | ICD-10-CM | POA: Insufficient documentation

## 2013-05-15 HISTORY — DX: Malignant neoplasm of bone and articular cartilage, unspecified: C41.9

## 2013-05-15 MED ORDER — IOHEXOL 300 MG/ML  SOLN
100.0000 mL | Freq: Once | INTRAMUSCULAR | Status: AC | PRN
  Administered 2013-05-15: 1001 mL via INTRAVENOUS

## 2013-05-15 MED ORDER — DEXAMETHASONE 4 MG PO TABS: 4.0000 mg | ORAL_TABLET | Freq: Two times a day (BID) | ORAL | Status: DC

## 2013-05-15 NOTE — Telephone Encounter (Signed)
Spoke with patient by phone, reviewed and explained her appointments.  She stated she is considering asking MD for some DME at home, but will confirm at 05/17/13 appointment.  She stated the pain medication was working and that she started the bowel regimen this morning.  This RN encouraged patient to call for questions or assist.  Will continue to follow.

## 2013-05-15 NOTE — Telephone Encounter (Signed)
Called pt, per Dr. Truett Perna scan shows L3 compression fracture. Begin Decadron 4 mg BID. Pt voiced understanding. Dr. Truett Perna will discuss scan results with Dr. Mitzi Hansen and plans to begin chemo ASAP. Pt agreeable to plan. Stated she will begin Decadron this evening.

## 2013-05-16 ENCOUNTER — Encounter (HOSPITAL_COMMUNITY): Payer: Self-pay

## 2013-05-16 ENCOUNTER — Ambulatory Visit: Payer: BC Managed Care – PPO | Admitting: Radiation Oncology

## 2013-05-16 ENCOUNTER — Encounter: Payer: Self-pay | Admitting: Radiation Oncology

## 2013-05-16 ENCOUNTER — Other Ambulatory Visit: Payer: Self-pay | Admitting: *Deleted

## 2013-05-16 ENCOUNTER — Ambulatory Visit: Admission: RE | Admit: 2013-05-16 | Payer: BC Managed Care – PPO | Source: Ambulatory Visit

## 2013-05-16 ENCOUNTER — Ambulatory Visit (HOSPITAL_COMMUNITY)
Admission: RE | Admit: 2013-05-16 | Discharge: 2013-05-16 | Disposition: A | Discharge status: Home / Self Care | Payer: BC Managed Care – PPO | Source: Ambulatory Visit | Attending: Oncology | Admitting: Oncology

## 2013-05-16 ENCOUNTER — Ambulatory Visit
Admission: RE | Admit: 2013-05-16 | Payer: BC Managed Care – PPO | Source: Ambulatory Visit | Admitting: Radiation Oncology

## 2013-05-16 ENCOUNTER — Other Ambulatory Visit: Payer: Self-pay | Admitting: Nurse Practitioner

## 2013-05-16 ENCOUNTER — Other Ambulatory Visit: Payer: Self-pay | Admitting: Oncology

## 2013-05-16 DIAGNOSIS — C221 Intrahepatic bile duct carcinoma: Secondary | ICD-10-CM | POA: Insufficient documentation

## 2013-05-16 DIAGNOSIS — K219 Gastro-esophageal reflux disease without esophagitis: Secondary | ICD-10-CM | POA: Insufficient documentation

## 2013-05-16 DIAGNOSIS — K831 Obstruction of bile duct: Secondary | ICD-10-CM | POA: Insufficient documentation

## 2013-05-16 DIAGNOSIS — Z87891 Personal history of nicotine dependence: Secondary | ICD-10-CM | POA: Insufficient documentation

## 2013-05-16 DIAGNOSIS — C801 Malignant (primary) neoplasm, unspecified: Secondary | ICD-10-CM

## 2013-05-16 LAB — COMPREHENSIVE METABOLIC PANEL WITH GFR
ALT: 101 U/L — ABNORMAL HIGH (ref 0–35)
AST: 41 U/L — ABNORMAL HIGH (ref 0–37)
Albumin: 2.5 g/dL — ABNORMAL LOW (ref 3.5–5.2)
Alkaline Phosphatase: 331 U/L — ABNORMAL HIGH (ref 39–117)
BUN: 11 mg/dL (ref 6–23)
CO2: 26 meq/L (ref 19–32)
Calcium: 9.9 mg/dL (ref 8.4–10.5)
Chloride: 92 meq/L — ABNORMAL LOW (ref 96–112)
Creatinine, Ser: 0.52 mg/dL (ref 0.50–1.10)
GFR calc Af Amer: >90 mL/min
GFR calc non Af Amer: >90 mL/min
Glucose, Bld: 110 mg/dL — ABNORMAL HIGH (ref 70–99)
Potassium: 3.9 meq/L (ref 3.5–5.1)
Sodium: 128 meq/L — ABNORMAL LOW (ref 135–145)
Total Bilirubin: 9.1 mg/dL — ABNORMAL HIGH (ref 0.3–1.2)
Total Protein: 6.4 g/dL (ref 6.0–8.3)

## 2013-05-16 LAB — CBC
HCT: 35.2 % — ABNORMAL LOW (ref 36.0–46.0)
Platelets: 487 10*3/uL — ABNORMAL HIGH (ref 150–400)
RBC: 4.29 MIL/uL (ref 3.87–5.11)
WBC: 14 10*3/uL — ABNORMAL HIGH (ref 4.0–10.5)

## 2013-05-16 LAB — PROTIME-INR: Prothrombin Time: 13.1 seconds (ref 11.6–15.2)

## 2013-05-16 MED ORDER — MIDAZOLAM HCL 2 MG/2ML IJ SOLN
INTRAMUSCULAR | Status: AC | PRN
  Administered 2013-05-16 (×5): 1 mg via INTRAVENOUS

## 2013-05-16 MED ORDER — IOHEXOL 300 MG/ML  SOLN
25.0000 mL | Freq: Once | INTRAMUSCULAR | Status: AC | PRN
  Administered 2013-05-16: 1 mL

## 2013-05-16 MED ORDER — HYDROCODONE-ACETAMINOPHEN 5-325 MG PO TABS: 1.0000 | ORAL_TABLET | ORAL | Status: DC | PRN

## 2013-05-16 MED ORDER — FENTANYL CITRATE 0.05 MG/ML IJ SOLN
INTRAMUSCULAR | Status: AC | PRN
  Administered 2013-05-16 (×3): 50 ug via INTRAVENOUS
  Administered 2013-05-16: 100 ug via INTRAVENOUS

## 2013-05-16 MED ORDER — FENTANYL CITRATE 0.05 MG/ML IJ SOLN
INTRAMUSCULAR | Status: AC
  Filled 2013-05-16: qty 6

## 2013-05-16 MED ORDER — MIDAZOLAM HCL 2 MG/2ML IJ SOLN
INTRAMUSCULAR | Status: AC
  Filled 2013-05-16: qty 6

## 2013-05-16 MED ORDER — PIPERACILLIN-TAZOBACTAM 3.375 G IVPB
3.3750 g | Freq: Once | INTRAVENOUS | Status: AC
  Administered 2013-05-16: 3.375 g via INTRAVENOUS
  Filled 2013-05-16: qty 50

## 2013-05-16 MED ORDER — SODIUM CHLORIDE 0.9 % IV SOLN
Freq: Once | INTRAVENOUS | Status: AC
  Administered 2013-05-16: 20 mL/h via INTRAVENOUS

## 2013-05-16 MED ORDER — LIDOCAINE HCL 1 % IJ SOLN
INTRAMUSCULAR | Status: AC
  Filled 2013-05-16: qty 20

## 2013-05-16 MED ORDER — LIDOCAINE-EPINEPHRINE (PF) 2 %-1:200000 IJ SOLN
INTRAMUSCULAR | Status: AC
  Filled 2013-05-16: qty 20

## 2013-05-16 MED ORDER — CEFAZOLIN SODIUM-DEXTROSE 2-3 GM-% IV SOLR: 2.0000 g | Freq: Once | INTRAVENOUS | Status: DC

## 2013-05-16 NOTE — H&P (Signed)
HPI: Bianca Logan is an 62 y.o. female with obstructive jaundice from tumor. She underwent an Int/Ext biliary drain a few weeks ago which has been working well. She has developed increased jaundice and pain and her bilirubin is rising again. She had a new CT which shows disease progression, but the biliary system is decompressed. After discussion with Dr. Truett Perna, decision is made to attempt internalization to biliary stent. PMHx and meds reviewed.  Past Medical History:  Past Medical History  Diagnosis Date  . 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 GERD (gastroesophageal reflux disease)     Past Surgical History:  Past Surgical History  Procedure Laterality Date  . Cyst removed from left breast  01/1999  . Ercp N/A 05/02/2013    Procedure: ENDOSCOPIC RETROGRADE CHOLANGIOPANCREATOGRAPHY (ERCP);  Surgeon: Willis Modena, MD;  Location: [facility];  Service: Endoscopy;  Laterality: N/A;  . Biliary stent placement N/A 05/02/2013    Procedure: BILIARY STENT PLACEMENT;  Surgeon: Willis Modena, MD;  Location: [facility];  Service: Endoscopy;  Laterality: N/A;  . Eus N/A 05/02/2013    Procedure: FULL UPPER ENDOSCOPIC ULTRASOUND (EUS) RADIAL;  Surgeon: Willis Modena, MD;  Location: [facility];  Service: Endoscopy;  Laterality: N/A;    Family History:  Family History  Problem Relation Age of Onset  . Cancer Mother   . Heart attack Father   . Anuerysm Sister   . Obesity Brother     Social History:  reports that she quit smoking about 28 years ago. Her smoking use included Cigarettes. She started smoking about 28 years ago. She has a 18 pack-year smoking history. She has never used smokeless tobacco. She reports that she drinks about 2.4 ounces of alcohol per week. She reports that she does not use illicit drugs.  Allergies: No Known Allergies  Medications:   Medication List    ASK your doctor about these medications       calcium-vitamin D 500-200 MG-UNIT per  tablet  Commonly known as:  OSCAL WITH D  Take 1 tablet by mouth daily with breakfast.     dexamethasone 4 MG tablet  Commonly known as:  DECADRON  Take 1 tablet (4 mg total) by mouth 2 (two) times daily with a meal.     morphine 30 MG 12 hr tablet  Commonly known as:  MS CONTIN  Take 1 tablet (30 mg total) by mouth every 12 (twelve) hours.     morphine 15 MG tablet  Commonly known as:  MSIR  Take 1 tablet (15 mg total) by mouth every 4 (four) hours as needed for severe pain.     multivitamin with minerals Tabs tablet  Take 1 tablet by mouth daily.     omeprazole 20 MG capsule  Commonly known as:  PRILOSEC  Take 20 mg by mouth 2 (two) times daily.     polyethylene glycol packet  Commonly known as:  MIRALAX  Take 17 g by mouth daily.     senna-docusate 8.6-50 MG per tablet  Commonly known as:  SENNA S  Take 1 tablet by mouth 2 (two) times daily.        Please HPI for pertinent positives, otherwise complete 10 system ROS negative.  Physical Exam: Pulse 103  Resp 18  Ht 5\' 4"  (1.626 m)  SpO2 100% There is no weight on file to calculate BMI.   General Appearance:  Alert, cooperative, no distress, appears stated age  Head:  Normocephalic, without obvious abnormality, atraumatic  ENT: Unremarkable  Neck: Supple, symmetrical, trachea midline  Lungs:   Clear to auscultation bilaterally, no w/r/r,   Heart:  Regular rate and rhythm, S1, S2 normal, no murmur, rub or gallop.  Abdomen:   Soft, non-tender, non distended. Biliary drain intact, thin dark yellow bile output  Extremities: Extremities normal, atraumatic, no cyanosis or edema  Pulses: 2+ and symmetric  Neurologic: Normal affect, no gross deficits.   No results found for this or any previous visit (from the past 48 hour(s)). Ct Chest W Contrast  05/15/2013   CLINICAL DATA:  Patient with recent diagnosis of cholangiocarcinoma. Status post interval biliary drain placement.  EXAM: CT CHEST, ABDOMEN, AND PELVIS WITH  CONTRAST  TECHNIQUE: Multidetector CT imaging of the chest, abdomen and pelvis was performed following the standard protocol during bolus administration of intravenous contrast.  CONTRAST:  OMNIPAQUE IOHEXOL 300 MG/ML  SOLN  COMPARISON:  MRI abdomen 04/26/2013; CT 04/25/2013.  FINDINGS: CT CHEST FINDINGS  Visualized thyroid is unremarkable. There is a 1.3 cm soft tissue mass within the medial aspect of the right breast (image 16; series 2). There is a 1.9 cm left supraclavicular lymph node (image 7; series 2). Multiple enlarged mediastinal lymph nodes are demonstrated including a 1.4 cm superior mediastinal node (image 16; series 2); a 1.0 cm precarinal lymph node (image 23; series 2); and a 1.8 cm posterior mediastinal lymph node (image 29; series 2).  Normal heart size. No pericardial effusion. The central airways are patent. There is a 3 mm left upper lobe pulmonary nodule (image 29; series 5). No pleural effusion or pneumothorax.  CT ABDOMEN AND PELVIS FINDINGS  Interval increase in size of previously visualized low-attenuation lesions throughout the liver with a posterior right hepatic lobe lesion measuring 3.3 x 2.8 cm, previously 2.3 x 2.1 cm; a 4.1 x 4.4 cm hepatic dome lesion, previously 1.8 x 2.2 cm. Multiple additional new and enlarging lesions are demonstrated throughout the liver including a 2.0 cm right hepatic lesion, previously measuring 0.9 cm (image 64; series 2) as well as a new 1.8 cm subcapsular left hepatic lobe lesion.  Interval placement of a percutaneous biliary drain entering through the left hepatic lobe with the distal tip terminating in the duodenum. There has been interval decompression of the right and left biliary tree. Small amount of pneumobilia. Portal vein is patent. Gallbladder is distended with persistent gallbladder wall thickening. Cholelithiasis.  The spleen and pancreas are grossly unremarkable. Re- demonstrated extensive porta hepatic lymphadenopathy including a 2.8 x  3.4 cm nodal conglomerate, previously 2.7 x 2.2 cm. Extensive abnormal retroperitoneal soft tissue surrounding the upper abdominal aorta as well as the celiac axis and takeoff of the bilateral renal arteries.  The kidneys enhance symmetrically with contrast. There is a 2.5 cm right adrenal mass.  Additional bulky retroperitoneal lymphadenopathy is grossly similar when compared are prior examination. Urinary bladder is grossly unremarkable. Uterus is grossly unremarkable. Re- demonstrated 2.5 cm calcified lesion within the left hemipelvis. No abnormal bowel wall thickening. No evidence for bowel obstruction.  There is a 7 mm soft tissue nodule within the subcutaneous fat of the anterior abdominal wall.  Interval development of a pathologic compression fracture of the L3 vertebral body with extension of soft tissue components into the canal which is narrowed approximately 50%. Unchanged superior endplate deformity of the L1 vertebral body. Additionally there is metastatic involvement of the T9 vertebral body with small amount of extension into the vertebral canal at this level. Additionally there is a small amount  of abnormal enhancing soft tissue demonstrated at the or T7 level with mild narrowing of the canal. There is also a 2.6 cm destructive lesion involving the lateral aspect of the left 3rd rib.  IMPRESSION: 1. Interval placement of percutaneous biliary drain with subsequent decompression of the intrahepatic and extrahepatic biliary system. Small amount a pneumobilia. 2. Findings compatible with diffuse metastatic disease which has progressed from the CT dated 04/25/2013. There are multiple new and enlarging hepatic metastatic lesions as well as worsening osseous metastatic disease. Additionally there is extensive porta hepatic, retroperitoneal, mediastinal and supraclavicular lymphadenopathy. 3. Multiple osseous metastatic lesions including a large soft tissue lesion with associated pathologic compression  fracture of the L3 vertebral body. Posterior soft tissue extension of this lesion narrows the spinal canal approximately 50%. 4. Soft tissue involvement of the spinal canal at the T9 and T7 levels with mild anterior compression on the cord at the T7 level. 5. Small left upper lobe pulmonary nodule. 6. Cholelithiasis and gallbladder wall thickening. Critical Value/emergent results were called by telephone at the time of interpretation on 05/15/2013 at 4:29 PM to [facility] , who verbally acknowledged these results.   Electronically Signed   By: Annia Belt M.D.   On: 05/15/2013 16:33   Ct Abdomen Pelvis W Contrast  05/15/2013   CLINICAL DATA:  Patient with recent diagnosis of cholangiocarcinoma. Status post interval biliary drain placement.  EXAM: CT CHEST, ABDOMEN, AND PELVIS WITH CONTRAST  TECHNIQUE: Multidetector CT imaging of the chest, abdomen and pelvis was performed following the standard protocol during bolus administration of intravenous contrast.  CONTRAST:  OMNIPAQUE IOHEXOL 300 MG/ML  SOLN  COMPARISON:  MRI abdomen 04/26/2013; CT 04/25/2013.  FINDINGS: CT CHEST FINDINGS  Visualized thyroid is unremarkable. There is a 1.3 cm soft tissue mass within the medial aspect of the right breast (image 16; series 2). There is a 1.9 cm left supraclavicular lymph node (image 7; series 2). Multiple enlarged mediastinal lymph nodes are demonstrated including a 1.4 cm superior mediastinal node (image 16; series 2); a 1.0 cm precarinal lymph node (image 23; series 2); and a 1.8 cm posterior mediastinal lymph node (image 29; series 2).  Normal heart size. No pericardial effusion. The central airways are patent. There is a 3 mm left upper lobe pulmonary nodule (image 29; series 5). No pleural effusion or pneumothorax.  CT ABDOMEN AND PELVIS FINDINGS  Interval increase in size of previously visualized low-attenuation lesions throughout the liver with a posterior right hepatic lobe lesion measuring 3.3 x 2.8 cm,  previously 2.3 x 2.1 cm; a 4.1 x 4.4 cm hepatic dome lesion, previously 1.8 x 2.2 cm. Multiple additional new and enlarging lesions are demonstrated throughout the liver including a 2.0 cm right hepatic lesion, previously measuring 0.9 cm (image 64; series 2) as well as a new 1.8 cm subcapsular left hepatic lobe lesion.  Interval placement of a percutaneous biliary drain entering through the left hepatic lobe with the distal tip terminating in the duodenum. There has been interval decompression of the right and left biliary tree. Small amount of pneumobilia. Portal vein is patent. Gallbladder is distended with persistent gallbladder wall thickening. Cholelithiasis.  The spleen and pancreas are grossly unremarkable. Re- demonstrated extensive porta hepatic lymphadenopathy including a 2.8 x 3.4 cm nodal conglomerate, previously 2.7 x 2.2 cm. Extensive abnormal retroperitoneal soft tissue surrounding the upper abdominal aorta as well as the celiac axis and takeoff of the bilateral renal arteries.  The kidneys enhance symmetrically with contrast.  There is a 2.5 cm right adrenal mass.  Additional bulky retroperitoneal lymphadenopathy is grossly similar when compared are prior examination. Urinary bladder is grossly unremarkable. Uterus is grossly unremarkable. Re- demonstrated 2.5 cm calcified lesion within the left hemipelvis. No abnormal bowel wall thickening. No evidence for bowel obstruction.  There is a 7 mm soft tissue nodule within the subcutaneous fat of the anterior abdominal wall.  Interval development of a pathologic compression fracture of the L3 vertebral body with extension of soft tissue components into the canal which is narrowed approximately 50%. Unchanged superior endplate deformity of the L1 vertebral body. Additionally there is metastatic involvement of the T9 vertebral body with small amount of extension into the vertebral canal at this level. Additionally there is a small amount of abnormal  enhancing soft tissue demonstrated at the or T7 level with mild narrowing of the canal. There is also a 2.6 cm destructive lesion involving the lateral aspect of the left 3rd rib.  IMPRESSION: 1. Interval placement of percutaneous biliary drain with subsequent decompression of the intrahepatic and extrahepatic biliary system. Small amount a pneumobilia. 2. Findings compatible with diffuse metastatic disease which has progressed from the CT dated 04/25/2013. There are multiple new and enlarging hepatic metastatic lesions as well as worsening osseous metastatic disease. Additionally there is extensive porta hepatic, retroperitoneal, mediastinal and supraclavicular lymphadenopathy. 3. Multiple osseous metastatic lesions including a large soft tissue lesion with associated pathologic compression fracture of the L3 vertebral body. Posterior soft tissue extension of this lesion narrows the spinal canal approximately 50%. 4. Soft tissue involvement of the spinal canal at the T9 and T7 levels with mild anterior compression on the cord at the T7 level. 5. Small left upper lobe pulmonary nodule. 6. Cholelithiasis and gallbladder wall thickening. Critical Value/emergent results were called by telephone at the time of interpretation on 05/15/2013 at 4:29 PM to [facility] , who verbally acknowledged these results.   Electronically Signed   By: Annia Belt M.D.   On: 05/15/2013 16:33    Assessment/Plan Obstructive jaundice from tumor. Discussed plan for cholangiogram, possible internal biliary stent placement and removal of external drain. Labs pending Explained procedure, risks, complications, use of sedation. Consent signed in chart  Brayton El PA-C 05/16/2013, 10:01 AM

## 2013-05-16 NOTE — Progress Notes (Signed)
    Author: Tessa Lerner, RN  Service: (none)  Author Type: Registered Nurse      Filed: 05/16/2013 10:57 AM  Note Time: 05/16/2013 10:48 AM  Status: Incomplete      Editor: Imberly Troxler Herb Grays, RN (Registered Nurse)        Histology and Location of Primary Cancer:05/02/2013 Cholangiocarcinoma  Diagnosis  FINE NEEDLE ASPIRATION, ENDOSCOPIC, BILE DUCT TUMOR(SPECIMEN 1 OF 1 COLLECTED  05/02/13):  MALIGNANT CELLS CONSISTENT WITH ADENOCARCINOMA  Sites of Visceral and Bony Metastatic Disease:  Location(s) of Symptomatic Metastases:Hepatic lesions,osseus mets (T9,L3,L4 and L5 and pathologic fracture of L3 seen on ct of chest/abdomen/pelvis on 05/15/2013. Onset of symptoms began on April 16, 2013 with nausea and then back pain which was attributed with lengthy travel preparing for marathon.Patient later noticed urine color change and chalky stools.Seen by PCP and liver enzymes were found to be elevated.Cancer confirmed on ct performed on 04/25/13.  Past/Anticipated chemotherapy by medical oncology, if any:to discuss systemic therapy of possible gemcitabine/oxaliplatin to begin in a few weeks.Has follow up today with medical oncology this afternoon.  Pain on a scale is 6 this morning which is the best it has been down from an 9 or 10.Relieved with ms contin and msir. If Spine Met(s), symptoms, if any, include: low back pain,right leg pain when standing  Bowel/Bladder retention:An attempt was made to place internal biliary stent was unsuccessful due to cancer.  Numbness or weakness in extremities Weakness difficulty standing right leg pain  Current Decadron regimen, if applicable: yes,  05/15/13 4mg  tab  1 po bid  With meals  Ambulatory status:wheelchair  SAFETY ISSUES: yes,   Prior radiation? No  Pacemaker/ICD? No  Possible current pregnancy? No  Is the patient on methotrexate?No Current Complaints / other details: 05/16/13:CHOLANGIOGRAM VIA EXISTING CATHETER; EXCHANGE OF BILIARY  CATHETER ,WITH FLUOROSCOPY  Physician: Rachelle Hora. 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 Drinks social alcohol, 10 lb wt.loss  use, pt's mother dx unknown cancer age 81, father prostate cancer 73, sister aneurysm.No longer working.

## 2013-05-16 NOTE — Procedures (Signed)
Cholangiogram confirmed that the entire intrahepatic biliary system was decompressed.  There is diffuse narrowing and irregularity of the extrahepatic bile duct.  The disease extends up towards the hilum.  A metallic stent was not placed but a new internal-external biliary drain was placed and upsized to 12 [place].  The new catheter was working well and capped at end of procedure.  Plan to keep the catheter capped for internal drainage.   See Radiology report.

## 2013-05-17 ENCOUNTER — Ambulatory Visit
Admission: RE | Admit: 2013-05-17 | Discharge: 2013-05-17 | Disposition: A | Discharge status: Home / Self Care | Payer: BC Managed Care – PPO | Source: Ambulatory Visit | Attending: Radiation Oncology | Admitting: Radiation Oncology

## 2013-05-17 ENCOUNTER — Encounter: Payer: Self-pay | Admitting: Radiation Oncology

## 2013-05-17 ENCOUNTER — Institutional Professional Consult (permissible substitution): Payer: BC Managed Care – PPO | Admitting: Radiation Oncology

## 2013-05-17 ENCOUNTER — Encounter: Payer: Self-pay | Admitting: *Deleted

## 2013-05-17 ENCOUNTER — Telehealth: Payer: Self-pay | Admitting: Oncology

## 2013-05-17 ENCOUNTER — Ambulatory Visit (HOSPITAL_BASED_OUTPATIENT_CLINIC_OR_DEPARTMENT_OTHER): Payer: BC Managed Care – PPO | Admitting: Nurse Practitioner

## 2013-05-17 VITALS — BP 112/43 | HR 93 | Temp 97.7°F | Resp 18 | Ht 64.0 in | Wt 135.9 lb

## 2013-05-17 VITALS — BP 109/73 | HR 96 | Temp 98.1°F | Resp 20 | Wt 135.9 lb

## 2013-05-17 DIAGNOSIS — L989 Disorder of the skin and subcutaneous tissue, unspecified: Secondary | ICD-10-CM

## 2013-05-17 DIAGNOSIS — C7951 Secondary malignant neoplasm of bone: Secondary | ICD-10-CM | POA: Insufficient documentation

## 2013-05-17 DIAGNOSIS — R5381 Other malaise: Secondary | ICD-10-CM | POA: Insufficient documentation

## 2013-05-17 DIAGNOSIS — C787 Secondary malignant neoplasm of liver and intrahepatic bile duct: Secondary | ICD-10-CM

## 2013-05-17 DIAGNOSIS — C221 Intrahepatic bile duct carcinoma: Secondary | ICD-10-CM | POA: Insufficient documentation

## 2013-05-17 DIAGNOSIS — M545 Low back pain, unspecified: Secondary | ICD-10-CM | POA: Insufficient documentation

## 2013-05-17 DIAGNOSIS — R599 Enlarged lymph nodes, unspecified: Secondary | ICD-10-CM

## 2013-05-17 DIAGNOSIS — C801 Malignant (primary) neoplasm, unspecified: Secondary | ICD-10-CM

## 2013-05-17 DIAGNOSIS — C419 Malignant neoplasm of bone and articular cartilage, unspecified: Secondary | ICD-10-CM

## 2013-05-17 DIAGNOSIS — G893 Neoplasm related pain (acute) (chronic): Secondary | ICD-10-CM

## 2013-05-17 DIAGNOSIS — R630 Anorexia: Secondary | ICD-10-CM

## 2013-05-17 DIAGNOSIS — R634 Abnormal weight loss: Secondary | ICD-10-CM

## 2013-05-17 HISTORY — DX: Malignant (primary) neoplasm, unspecified: C80.1

## 2013-05-17 HISTORY — DX: Malignant neoplasm of bone and articular cartilage, unspecified: C41.9

## 2013-05-17 MED ORDER — MORPHINE SULFATE 15 MG PO TABS: 15.0000 mg | ORAL_TABLET | ORAL | Status: DC | PRN

## 2013-05-17 MED ORDER — RADIAPLEXRX EX GEL
Freq: Once | CUTANEOUS | Status: AC
  Administered 2013-05-17: 10:00:00 via TOPICAL

## 2013-05-17 NOTE — Addendum Note (Signed)
Encounter addended by: Delynn Flavin, RN on: 05/17/2013  7:30 PM<BR>     Documentation filed: Charges VN

## 2013-05-17 NOTE — Progress Notes (Signed)
Per Dr Michell Heinrich, gave pt Radiaplex lotion w/instructions to apply twice daily from mid back at bra line to lower spine area. Advised she apply at least 4 hours prior to treatment or after treatment and at bedtime. Pt and husband verbalized understanding.

## 2013-05-17 NOTE — Progress Notes (Signed)
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  CONSENT VERIFIED: yes   SET UP: Patient is setup supine   IMMOBILIZATION:  The following immobilization was used: vacloc  NARRATIVE:  Pt Clyne was brought to the CT Simulation planning suite.  Identity was confirmed.  All relevant records and images related to the planned course of therapy were reviewed.  Then, the patient was positioned in a stable reproducible clinical set-up for radiation therapy.  CT images were obtained.  An isocenter was placed. Skin markings were placed.  The CT images were loaded into the planning software where the target and avoidance structures were contoured.  The radiation prescription was entered and confirmed. The patient was discharged in stable condition and tolerated simulation well.    TREATMENT PLANNING NOTE:  Treatment planning then occurred. I have requested : MLC's, isodose plan, basic dose calculation  I have requested 3 dimensional simulation with DVH of cord, kidneys, bowel and GTV  I personally oversaw and approved the construction of 6 medically necessary complex treatment devices.

## 2013-05-17 NOTE — Progress Notes (Addendum)
Radiation Oncology         [phone]) [phone] ________________________________  Initial outpatient Consultation - Date: 05/17/2013   Name: Bianca Logan MRN: [id number]   DOB: 15-Jul-1950  REFERRING PHYSICIAN: Ladene Artist, MD  DIAGNOSIS: Metastatic cholangiocarcinoma   HISTORY OF PRESENT ILLNESS::Bianca Logan is a 62 y.o. female  who developed low back pain dark urine and anorexia on a family vacation. She was admitted on 04/26/2013 to [facility] and a CT of the abdomen was remarkable for liver metastases, intrahepatic biliary ductal dilatation, common bile duct dilatation, and bulky retroperitoneal and porta hepatis adenopathy. A focus of metastatic disease was noted at L4 and a compression fracture at L1. An MRCP was performed on 04/26/2013 confirming A. infiltrating lesion in the head of the pancreas and metastatic lesions were noted to involve T9 L3-L4 and L5. An endoscopic ultrasound was performed on 05/02/2013. No pancreatic mass was seen. Soft tissue infiltration into the distal bile duct and surrounding portions of the head and uncinate process of the pancreas was noted with no pancreatic mass. A biopsy at that time showed malignant cells consistent with adenocarcinoma. An ERCP was unsuccessful and she was referred to interventional radiology for placement of an external drain on 05/02/2013. She remains jaundiced with a bilirubin consistently elevated above 10. A recent CT of the chest abdomen and pelvis on 05/15/2013 that showed a 1.3 cm right breast mass as well as left supraclavicular adenopathy and multiple enlarged mediastinal lymph nodes. In addition significantly increased in size of the hepatic metastatic disease including a now 3.3 x 2.8 cm mass previously measuring 2.3 x 2.1 cm and a 4.1 x 4.4 cm mass previously measuring 1.8 x 2.2 cm. Multiple additional new enlarging lesions were noted indicating significant progression of disease in 2-3 weeks since her previous scan.  Extensive porta hepatic lymphadenopathy was noted as well as extensive abnormal retroperitoneal soft tissue. A 2.5 cm right adrenal mass was noted and a 7 mm soft tissue nodule within the subcutaneous fat of the anterior abdominal wall. Pathologic compression fracture of L3 was noted with extension of soft tissue into the canal as well as metastatic involvement at T9 with a small amount of extension into the vertebral canal. A small enhancing soft tissue mass was noted at T7 as well as a 2.6 cm lesion in the left third rib. She complains of significant low back pain radiating to her right leg. This is associated with right leg weakness. She is on pain medications which controlled her pain but she is still not able to walk without significant pain and weakness. Dr. Truett Perna would like to start her chemotherapy as soon as possible to to this significant progression of disease and asked me to see her for consideration of palliative radiation. She has normal bowel and bladder function after having a large bowel movement last night. She is accompanied by her husband today.  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    PAST SURGICAL HISTORY: Past Surgical History  Procedure Laterality Date  . Cyst removed from left breast  01/1999  . Ercp N/A 05/02/2013    Procedure: ENDOSCOPIC RETROGRADE CHOLANGIOPANCREATOGRAPHY (ERCP);  Surgeon: Willis Modena, MD;  Location: [facility];  Service: Endoscopy;  Laterality: N/A;  . Biliary stent placement N/A 05/02/2013    Procedure: BILIARY STENT PLACEMENT;  Surgeon: Willis Modena, MD;  Location:  [facility];  Service: Endoscopy;  Laterality: N/A;  . Eus N/A 05/02/2013    Procedure: FULL UPPER ENDOSCOPIC ULTRASOUND (EUS) RADIAL;  Surgeon: Willis Modena, MD;  Location: [facility];  Service:  Endoscopy;  Laterality: N/A;    FAMILY HISTORY:  Family History  Problem Relation Age of Onset  . Cancer Mother     Undetermined  . Heart attack Father   . Cancer Father     prostate  . Anuerysm Sister   . Obesity Brother     SOCIAL HISTORY:  History  Substance Use Topics  . Smoking status: Former Smoker -- 1.50 packs/day for 12 years    Types: Cigarettes    Start date: 07/11/1984    Quit date: 04/26/1985  . Smokeless tobacco: Never Used  . Alcohol Use: 2.4 oz/week    2 Glasses of wine, 2 Cans of beer per week     Comment: social     ALLERGIES: Review of patient's allergies indicates no known allergies.  MEDICATIONS:  Current Outpatient Prescriptions  Medication Sig Dispense Refill  . calcium-vitamin D (OSCAL WITH D) 500-200 MG-UNIT per tablet Take 1 tablet by mouth daily with breakfast.      . dexamethasone (DECADRON) 4 MG tablet Take 1 tablet (4 mg total) by mouth 2 (two) times daily with a meal.  60 tablet  0  . hyaluronate sodium (RADIAPLEXRX) GEL Apply 1 application topically 2 (two) times daily.      Marland Kitchen morphine (MS CONTIN) 30 MG 12 hr tablet Take 1 tablet (30 mg total) by mouth every 12 (twelve) hours.  60 tablet  0  . morphine (MSIR) 15 MG tablet Take 1 tablet (15 mg total) by mouth every 4 (four) hours as needed for severe pain.  30 tablet  0  . Multiple Vitamin (MULTIVITAMIN WITH MINERALS) TABS tablet Take 1 tablet by mouth daily.      Marland Kitchen omeprazole (PRILOSEC) 20 MG capsule Take 20 mg by mouth 2 (two) times daily.      . polyethylene glycol (MIRALAX) packet Take 17 g by mouth daily.  30 each  6  . senna-docusate (SENNA S) 8.6-50 MG per tablet Take 1 tablet by mouth 2 (two) times daily.  60 tablet  0  . ondansetron (ZOFRAN) 4 MG tablet Take 4 mg by mouth.       No current facility-administered medications for this encounter.    REVIEW OF SYSTEMS:  A 15 point review of systems is documented in the electronic medical record. This was obtained by the nursing staff.  However, I reviewed this with the patient to discuss relevant findings and make appropriate changes.  Pertinent items are noted in HPI.  PHYSICAL EXAM:  Filed Vitals:   05/17/13 0838  BP: 109/73  Pulse: 96  Temp: 98.1 F (36.7 C)  Resp: 20  .135 lb 14.4 oz (61.644 kg). He is a jaundice thin female in no distress sitting comfortably in a wheelchair. She is 5 out of 5 biceps triceps strength bilaterally. She has 4/5 hip flexion and extension strength on the right and 5 out of 5 on the left. She has intact sensation to light touch throughout her bilateral upper and lower extremities.  LABORATORY DATA:  Lab Results  Component Value Date   WBC 14.0* 05/16/2013   HGB 12.3 05/16/2013   HCT 35.2* 05/16/2013   MCV 82.1 05/16/2013   PLT 487* 05/16/2013   Lab Results  Component Value Date   NA 128* 05/16/2013   K  3.9 05/16/2013   CL 92* 05/16/2013   CO2 26 05/16/2013   Lab Results  Component Value Date   ALT 101* 05/16/2013   AST 41* 05/16/2013   ALKPHOS 331* 05/16/2013   BILITOT 9.1* 05/16/2013     RADIOGRAPHY: Ct Chest W Contrast  05/15/2013   CLINICAL DATA:  Patient with recent diagnosis of cholangiocarcinoma. Status post interval biliary drain placement.  EXAM: CT CHEST, ABDOMEN, AND PELVIS WITH CONTRAST  TECHNIQUE: Multidetector CT imaging of the chest, abdomen and pelvis was performed following the standard protocol during bolus administration of intravenous contrast.  CONTRAST:  OMNIPAQUE IOHEXOL 300 MG/ML  SOLN  COMPARISON:  MRI abdomen 04/26/2013; CT 04/25/2013.  FINDINGS: CT CHEST FINDINGS  Visualized thyroid is unremarkable. There is a 1.3 cm soft tissue mass within the medial aspect of the right breast (image 16; series 2). There is a 1.9 cm left supraclavicular lymph node (image 7; series 2). Multiple enlarged mediastinal lymph nodes are demonstrated including a 1.4 cm superior mediastinal node (image 16; series 2); a 1.0 cm precarinal lymph node (image 23; series 2); and a 1.8 cm  posterior mediastinal lymph node (image 29; series 2).  Normal heart size. No pericardial effusion. The central airways are patent. There is a 3 mm left upper lobe pulmonary nodule (image 29; series 5). No pleural effusion or pneumothorax.  CT ABDOMEN AND PELVIS FINDINGS  Interval increase in size of previously visualized low-attenuation lesions throughout the liver with a posterior right hepatic lobe lesion measuring 3.3 x 2.8 cm, previously 2.3 x 2.1 cm; a 4.1 x 4.4 cm hepatic dome lesion, previously 1.8 x 2.2 cm. Multiple additional new and enlarging lesions are demonstrated throughout the liver including a 2.0 cm right hepatic lesion, previously measuring 0.9 cm (image 64; series 2) as well as a new 1.8 cm subcapsular left hepatic lobe lesion.  Interval placement of a percutaneous biliary drain entering through the left hepatic lobe with the distal tip terminating in the duodenum. There has been interval decompression of the right and left biliary tree. Small amount of pneumobilia. Portal vein is patent. Gallbladder is distended with persistent gallbladder wall thickening. Cholelithiasis.  The spleen and pancreas are grossly unremarkable. Re- demonstrated extensive porta hepatic lymphadenopathy including a 2.8 x 3.4 cm nodal conglomerate, previously 2.7 x 2.2 cm. Extensive abnormal retroperitoneal soft tissue surrounding the upper abdominal aorta as well as the celiac axis and takeoff of the bilateral renal arteries.  The kidneys enhance symmetrically with contrast. There is a 2.5 cm right adrenal mass.  Additional bulky retroperitoneal lymphadenopathy is grossly similar when compared are prior examination. Urinary bladder is grossly unremarkable. Uterus is grossly unremarkable. Re- demonstrated 2.5 cm calcified lesion within the left hemipelvis. No abnormal bowel wall thickening. No evidence for bowel obstruction.  There is a 7 mm soft tissue nodule within the subcutaneous fat of the anterior abdominal wall.   Interval development of a pathologic compression fracture of the L3 vertebral body with extension of soft tissue components into the canal which is narrowed approximately 50%. Unchanged superior endplate deformity of the L1 vertebral body. Additionally there is metastatic involvement of the T9 vertebral body with small amount of extension into the vertebral canal at this level. Additionally there is a small amount of abnormal enhancing soft tissue demonstrated at the or T7 level with mild narrowing of the canal. There is also a 2.6 cm destructive lesion involving the lateral aspect of the left 3rd rib.  IMPRESSION: 1. Interval placement  of percutaneous biliary drain with subsequent decompression of the intrahepatic and extrahepatic biliary system. Small amount a pneumobilia. 2. Findings compatible with diffuse metastatic disease which has progressed from the CT dated 04/25/2013. There are multiple new and enlarging hepatic metastatic lesions as well as worsening osseous metastatic disease. Additionally there is extensive porta hepatic, retroperitoneal, mediastinal and supraclavicular lymphadenopathy. 3. Multiple osseous metastatic lesions including a large soft tissue lesion with associated pathologic compression fracture of the L3 vertebral body. Posterior soft tissue extension of this lesion narrows the spinal canal approximately 50%. 4. Soft tissue involvement of the spinal canal at the T9 and T7 levels with mild anterior compression on the cord at the T7 level. 5. Small left upper lobe pulmonary nodule. 6. Cholelithiasis and gallbladder wall thickening. Critical Value/emergent results were called by telephone at the time of interpretation on 05/15/2013 at 4:29 PM to [facility] , who verbally acknowledged these results.   Electronically Signed   By: Annia Belt M.D.   On: 05/15/2013 16:33   Ct Abdomen W Contrast  04/25/2013   CLINICAL DATA:  Upper abdominal pain, nausea, decreased appetite. Elevated  LFTs.  EXAM: CT ABDOMEN WITH CONTRAST  TECHNIQUE: Multidetector CT imaging of the abdomen was performed using the standard protocol following bolus administration of intravenous contrast.  CONTRAST:  OMNIPAQUE IOHEXOL 300 MG/ML  SOLN  COMPARISON:  CT 12/15/2008  FINDINGS: Lung bases are clear. No effusions. Heart is normal size.  There is an in their scratch head there is an irregular solid-appearing lesion in the right hepatic dome measuring 18 mm on image 7 of series 2. Inferiorly in the right hepatic lobe, a 2nd solid-appearing lesion is noted measuring 2.3 cm on image 38.  Multiple layering gallstones noted within the gallbladder. There is gallbladder wall thickening measuring up to 8 mm near the fundus of the gallbladder. Significant intrahepatic biliary ductal dilatation. The common bile duct is difficult to visualize in the pancreas. The common bile duct is dilated in the porta hepatis, within not visualized below the superior aspect of the pancreatic head.  Spleen, adrenals and kidneys are unremarkable. No visible focal pancreatic abnormality.  There is retroperitoneal adenopathy. Bulky lymph nodes bilaterally on image 25. Aortocaval node on image 25 measures 2.6 x 1.9 cm. Mildly prominent central mesenteric lymph nodes. Ill-defined porta hepatis adenopathy, difficult to measure.  Moderate stool throughout the colon. Visualized small bowel is decompressed and unremarkable. Aorta is normal caliber.  Mild to moderate compression fracture through the superior endplate of L1. Tiny sclerotic focus within the inferior anterior aspect of L4, nonspecific. Cannot exclude a small sclerotic metastasis.  IMPRESSION: Solid appearing ill-defined lesions in the right lobe of the liver as described above concerning for metastases.  Marked intrahepatic and proximal extrahepatic biliary ductal dilatation. The common bile duct cannot be visualized below the superior aspect of the pancreatic head. The bili obstruction may  be related to compression from enlarged porta hepatis lymph nodes. Cannot exclude a Klatskin tumor other central biliary malignancy. No definite pancreatic head obstructing mass noted.  There is a focal gallbladder wall thickening, most likely related to cholecystitis. Cannot completely exclude gallbladder cancer.  Cholelithiasis.  Bulky retroperitoneal and porta hepatis adenopathy.  Recommend MRI of the abdomen without and with contrast and MRCP for further evaluation.  These results were discussed with Dr. Jillyn Hidden at the time of interpretation.   Electronically Signed   By: Charlett Nose M.D.   On: 04/25/2013 16:47   Ct Abdomen Pelvis W  Contrast  05/15/2013   CLINICAL DATA:  Patient with recent diagnosis of cholangiocarcinoma. Status post interval biliary drain placement.  EXAM: CT CHEST, ABDOMEN, AND PELVIS WITH CONTRAST  TECHNIQUE: Multidetector CT imaging of the chest, abdomen and pelvis was performed following the standard protocol during bolus administration of intravenous contrast.  CONTRAST:  OMNIPAQUE IOHEXOL 300 MG/ML  SOLN  COMPARISON:  MRI abdomen 04/26/2013; CT 04/25/2013.  FINDINGS: CT CHEST FINDINGS  Visualized thyroid is unremarkable. There is a 1.3 cm soft tissue mass within the medial aspect of the right breast (image 16; series 2). There is a 1.9 cm left supraclavicular lymph node (image 7; series 2). Multiple enlarged mediastinal lymph nodes are demonstrated including a 1.4 cm superior mediastinal node (image 16; series 2); a 1.0 cm precarinal lymph node (image 23; series 2); and a 1.8 cm posterior mediastinal lymph node (image 29; series 2).  Normal heart size. No pericardial effusion. The central airways are patent. There is a 3 mm left upper lobe pulmonary nodule (image 29; series 5). No pleural effusion or pneumothorax.  CT ABDOMEN AND PELVIS FINDINGS  Interval increase in size of previously visualized low-attenuation lesions throughout the liver with a posterior right hepatic lobe  lesion measuring 3.3 x 2.8 cm, previously 2.3 x 2.1 cm; a 4.1 x 4.4 cm hepatic dome lesion, previously 1.8 x 2.2 cm. Multiple additional new and enlarging lesions are demonstrated throughout the liver including a 2.0 cm right hepatic lesion, previously measuring 0.9 cm (image 64; series 2) as well as a new 1.8 cm subcapsular left hepatic lobe lesion.  Interval placement of a percutaneous biliary drain entering through the left hepatic lobe with the distal tip terminating in the duodenum. There has been interval decompression of the right and left biliary tree. Small amount of pneumobilia. Portal vein is patent. Gallbladder is distended with persistent gallbladder wall thickening. Cholelithiasis.  The spleen and pancreas are grossly unremarkable. Re- demonstrated extensive porta hepatic lymphadenopathy including a 2.8 x 3.4 cm nodal conglomerate, previously 2.7 x 2.2 cm. Extensive abnormal retroperitoneal soft tissue surrounding the upper abdominal aorta as well as the celiac axis and takeoff of the bilateral renal arteries.  The kidneys enhance symmetrically with contrast. There is a 2.5 cm right adrenal mass.  Additional bulky retroperitoneal lymphadenopathy is grossly similar when compared are prior examination. Urinary bladder is grossly unremarkable. Uterus is grossly unremarkable. Re- demonstrated 2.5 cm calcified lesion within the left hemipelvis. No abnormal bowel wall thickening. No evidence for bowel obstruction.  There is a 7 mm soft tissue nodule within the subcutaneous fat of the anterior abdominal wall.  Interval development of a pathologic compression fracture of the L3 vertebral body with extension of soft tissue components into the canal which is narrowed approximately 50%. Unchanged superior endplate deformity of the L1 vertebral body. Additionally there is metastatic involvement of the T9 vertebral body with small amount of extension into the vertebral canal at this level. Additionally there is a  small amount of abnormal enhancing soft tissue demonstrated at the or T7 level with mild narrowing of the canal. There is also a 2.6 cm destructive lesion involving the lateral aspect of the left 3rd rib.  IMPRESSION: 1. Interval placement of percutaneous biliary drain with subsequent decompression of the intrahepatic and extrahepatic biliary system. Small amount a pneumobilia. 2. Findings compatible with diffuse metastatic disease which has progressed from the CT dated 04/25/2013. There are multiple new and enlarging hepatic metastatic lesions as well as worsening osseous metastatic  disease. Additionally there is extensive porta hepatic, retroperitoneal, mediastinal and supraclavicular lymphadenopathy. 3. Multiple osseous metastatic lesions including a large soft tissue lesion with associated pathologic compression fracture of the L3 vertebral body. Posterior soft tissue extension of this lesion narrows the spinal canal approximately 50%. 4. Soft tissue involvement of the spinal canal at the T9 and T7 levels with mild anterior compression on the cord at the T7 level. 5. Small left upper lobe pulmonary nodule. 6. Cholelithiasis and gallbladder wall thickening. Critical Value/emergent results were called by telephone at the time of interpretation on 05/15/2013 at 4:29 PM to [facility] , who verbally acknowledged these results.   Electronically Signed   By: Annia Belt M.D.   On: 05/15/2013 16:33   Ir Cholan Exist Tube  05/16/2013   CLINICAL DATA:  62 year old with biliary obstruction and adenocarcinoma. Persistent elevation of the bilirubin level. Evaluate biliary system and evaluate for placement of internal stent.  EXAM: CHOLANGIOGRAM VIA EXISTING CATHETER; EXCHANGE OF BILIARY CATHETER WITH FLUOROSCOPY  Physician: Rachelle Hora. Henn, MD  MEDICATIONS: Versed 6 mg, fentanyl 300 mcg. A radiology nurse monitored the patient for moderate sedation. Zosyn 3.375 mg IV  ANESTHESIA/SEDATION: Moderate sedation time: 25 min   FLUOROSCOPY TIME:  6 min and 36 seconds  PROCEDURE: The procedure was explained to the patient. The risks and benefits of the procedure were discussed and the patient's questions were addressed. Informed consent was obtained from the patient. The patient was placed supine on the interventional table. The existing catheter and surrounding skin were prepped and draped in sterile fashion. Maximal barrier sterile technique was utilized including caps, mask, sterile gowns, sterile gloves, sterile drape, hand hygiene and skin antiseptic. Contrast was injected through the biliary drain. Biliary drain was cut and removed over a Glidewire. A 5 French Kumpe catheter was advanced into the duodenum and removed over a Bentson wire. A 9 French sheath was advanced into the left intrahepatic biliary system and additional cholangiogram images were obtained. Based on the cholangiogram, decided to place a new biliary drain rather than an internal stent. A 12 [place] biliary drain was advanced over the [employer] wire. The distal aspect reconstituted in the duodenum. Contrast injection confirmed that the drain was patent and functioning well. Catheter was sutured to the skin. The catheter was flushed with normal saline and capped. Fluoroscopic images were taken and saved for this procedure.  COMPLICATIONS: None  FINDINGS: The existing left internal/external biliary drain was well positioned. Cholangiogram demonstrated filling of the left and right intrahepatic biliary system. Biliary system was decompressed. There is irregularity and narrowing of the extrahepatic bile duct that extends near the hilum. Review of the prior cholangiogram, suggests that the extrahepatic duct disease may have slightly progressed towards the biliary hilum. No significant disease was identified in the intrahepatic bile ducts.  IMPRESSION: Exchange of the internal/external biliary drain. The new drain was capped.  Long segment irregularity and narrowing of the  extrahepatic bile duct. Findings are suggestive for malignancy. The biliary disease may have slightly progressed since the prior study on 05/02/2013.   Electronically Signed   By: Richarda Overlie M.D.   On: 05/16/2013 14:54   Ir Biliary Drain Catheter Placement  05/02/2013   INDICATION: History obstructive lesion involving the distal aspect of the common bile along failed attempted endoscopic stent placement, now with obstructive jaundice  EXAM: ULTRASOUND AND FLUOROSCOPIC GUIDED PERCUTANEOUS TRANSHEPATIC CHOLANGIOGRAM AND BILIARY TUBE PLACEMENT  MEDICATIONS: Ciprofloxacin had been given during preceding ERCP.  TECHNIQUE: Informed written  consent was obtained from the patient's family after a discussion of the risks, benefits and alternatives to treatment. Questions regarding the procedure were encouraged and answered. A timeout was performed prior to the initiation of the procedure.  The right upper abdominal quadrant was prepped and draped in the usual sterile fashion, and a sterile drape was applied covering the operative field. Maximum barrier sterile technique with sterile gowns and gloves were used for the procedure. A timeout was performed prior to the initiation of the procedure. A preprocedural spot radiograph was obtained of the right upper abdominal quadrant.  Ultrasound scanning of the right upper abdominal quadrant was performed to delineate the anatomy. Under direct ultrasound guidance, a dilated anteriorly directed biliary duct within the peripheral aspect of the left lobe of the liver was accessed with a 22 gauge Chiba needle. Contrast injection confirmed appropriate intra-biliary puncture. The duct was cannulated with a Nitrex wire and the tract was dilated with an Accustick set.  The Accustick catheter was exchanged for a Kumpe catheter over a [employer] wire. With the use of a regular glide wire, the Kumpe catheter was advanced beyond the distal CB be obstruction and into the duodenum. Over an Amplatz  stiff wire, the tract was dilated and a 10.2 [place] biliary drainage catheter was advanced with coil ultimately locked within the duodenum.  Limited contrast injection was performed and postprocedural spot radiographs were obtained in various obliquities. The catheter was connected to a drainage bag which yielded the brisk return of clear bile. The catheter was secured to the skin with an interrupted suture. The patient tolerated the procedure well without immediate postprocedural complication.  ANESTHESIA/SEDATION: Patient was maintained under general anesthesia from preceding ERCP.  CONTRAST:  25mL OMNIPAQUE IOHEXOL 300 MG/ML SOLN - administered into the biliary tree  COMPARISON:  MRCP -04/26/2013; CT abdomen pelvis - 04/25/2013  FLUOROSCOPY TIME:  2 minutes. 48  seconds.  COMPLICATIONS: None immediate  FINDINGS: Preprocedural ultrasound scanning demonstrates marked dilatation of the biliary tree as demonstrated on preceding MRCP and abdominal CT.  A preprocedural spot radiograph was obtained of the right upper abdominal quadrant demonstrating a minimal amount of residual contrast within dilated right posterior biliary ducts, an expected sequela of the preceding ERCP.  Under direct ultrasound guidance, a dilated duct within the peripheral anterior aspect of the left lobe of the liver was accessed ultimately allowing advancement of a 10 [place] biliary drainage catheter with an coiled and locked within the descending duodenum.  Limited post drainage catheter placement radiographs demonstrates malignant obstruction of the distal aspect of the common bile duct with moderate to severe dilatation of the opacified central biliary tree.  IMPRESSION: Successful placement of a 10.2 French percutaneous biliary drainage catheter with end coiled and locked within the duodenum.   Electronically Signed   By: Simonne Come M.D.   On: 05/02/2013 16:45   Ir Catheter Tube Change  05/16/2013   CLINICAL DATA:  62 year old with  biliary obstruction and adenocarcinoma. Persistent elevation of the bilirubin level. Evaluate biliary system and evaluate for placement of internal stent.  EXAM: CHOLANGIOGRAM VIA EXISTING CATHETER; EXCHANGE OF BILIARY CATHETER WITH FLUOROSCOPY  Physician: Rachelle Hora. Henn, MD  MEDICATIONS: Versed 6 mg, fentanyl 300 mcg. A radiology nurse monitored the patient for moderate sedation. Zosyn 3.375 mg IV  ANESTHESIA/SEDATION: Moderate sedation time: 25 min  FLUOROSCOPY TIME:  6 min and 36 seconds  PROCEDURE: The procedure was explained to the patient. The risks and benefits of the procedure were discussed and  the patient's questions were addressed. Informed consent was obtained from the patient. The patient was placed supine on the interventional table. The existing catheter and surrounding skin were prepped and draped in sterile fashion. Maximal barrier sterile technique was utilized including caps, mask, sterile gowns, sterile gloves, sterile drape, hand hygiene and skin antiseptic. Contrast was injected through the biliary drain. Biliary drain was cut and removed over a Glidewire. A 5 French Kumpe catheter was advanced into the duodenum and removed over a Bentson wire. A 9 French sheath was advanced into the left intrahepatic biliary system and additional cholangiogram images were obtained. Based on the cholangiogram, decided to place a new biliary drain rather than an internal stent. A 12 [place] biliary drain was advanced over the [employer] wire. The distal aspect reconstituted in the duodenum. Contrast injection confirmed that the drain was patent and functioning well. Catheter was sutured to the skin. The catheter was flushed with normal saline and capped. Fluoroscopic images were taken and saved for this procedure.  COMPLICATIONS: None  FINDINGS: The existing left internal/external biliary drain was well positioned. Cholangiogram demonstrated filling of the left and right intrahepatic biliary system. Biliary system was  decompressed. There is irregularity and narrowing of the extrahepatic bile duct that extends near the hilum. Review of the prior cholangiogram, suggests that the extrahepatic duct disease may have slightly progressed towards the biliary hilum. No significant disease was identified in the intrahepatic bile ducts.  IMPRESSION: Exchange of the internal/external biliary drain. The new drain was capped.  Long segment irregularity and narrowing of the extrahepatic bile duct. Findings are suggestive for malignancy. The biliary disease may have slightly progressed since the prior study on 05/02/2013.   Electronically Signed   By: Richarda Overlie M.D.   On: 05/16/2013 14:54   Mr 3d Recon At Scanner  04/27/2013   CLINICAL DATA:  Abdominal pain and elevated liver function studies. Marked biliary dilatation on recent CT scan.  EXAM: MRI ABDOMEN WITHOUT AND WITH CONTRAST (INCLUDING MRCP)  TECHNIQUE: Multiplanar multisequence MR imaging of the abdomen was performed both before and after the administration of intravenous contrast. Heavily T2-weighted images of the biliary and pancreatic ducts were obtained, and three-dimensional MRCP images were rendered by post processing.  CONTRAST:  13mL MULTIHANCE GADOBENATE DIMEGLUMINE 529 MG/ML IV SOLN  COMPARISON:  CT scan 04/25/2013.  FINDINGS: As demonstrated on the CT scan there is marked biliary dilatation. Significant intra hepatic biliary dilatation and a dilated common bile duct with an abrupt caliber change and marked compression just as the duct enters the head of the pancreas. There is an ill-defined infiltrating type lesion in the head and uncinate process region of the pancreas along with bulky celiac axis and retroperitoneal adenopathy. No pancreatic duct dilatation. Multiple hepatic metastatic lesions are again demonstrated.  Markedly dilated gallbladder with numerous gallstones and sludge.  The spleen is normal in size. No focal lesions. The adrenal glands and kidneys are  unremarkable.  There are metastatic bone lesions. The largest lesions are in the T9 and L3 vertebral bodies. Smaller lesions are noted in L4 and L5.  IMPRESSION: Infiltrating mass suspected in the head and uncinate process region of the pancreas obstructing the common bile duct and causing the intra and extrahepatic biliary dilatation. There is also bulky celiac axis and retroperitoneal lymphadenopathy and liver metastasis.  Metastatic bone disease.   Electronically Signed   By: Loralie Champagne M.D.   On: 04/27/2013 09:44   Ir Ptc  05/02/2013   INDICATION: History  obstructive lesion involving the distal aspect of the common bile along failed attempted endoscopic stent placement, now with obstructive jaundice  EXAM: ULTRASOUND AND FLUOROSCOPIC GUIDED PERCUTANEOUS TRANSHEPATIC CHOLANGIOGRAM AND BILIARY TUBE PLACEMENT  MEDICATIONS: Ciprofloxacin had been given during preceding ERCP.  TECHNIQUE: Informed written consent was obtained from the patient's family after a discussion of the risks, benefits and alternatives to treatment. Questions regarding the procedure were encouraged and answered. A timeout was performed prior to the initiation of the procedure.  The right upper abdominal quadrant was prepped and draped in the usual sterile fashion, and a sterile drape was applied covering the operative field. Maximum barrier sterile technique with sterile gowns and gloves were used for the procedure. A timeout was performed prior to the initiation of the procedure. A preprocedural spot radiograph was obtained of the right upper abdominal quadrant.  Ultrasound scanning of the right upper abdominal quadrant was performed to delineate the anatomy. Under direct ultrasound guidance, a dilated anteriorly directed biliary duct within the peripheral aspect of the left lobe of the liver was accessed with a 22 gauge Chiba needle. Contrast injection confirmed appropriate intra-biliary puncture. The duct was cannulated with a  Nitrex wire and the tract was dilated with an Accustick set.  The Accustick catheter was exchanged for a Kumpe catheter over a [employer] wire. With the use of a regular glide wire, the Kumpe catheter was advanced beyond the distal CB be obstruction and into the duodenum. Over an Amplatz stiff wire, the tract was dilated and a 10.2 [place] biliary drainage catheter was advanced with coil ultimately locked within the duodenum.  Limited contrast injection was performed and postprocedural spot radiographs were obtained in various obliquities. The catheter was connected to a drainage bag which yielded the brisk return of clear bile. The catheter was secured to the skin with an interrupted suture. The patient tolerated the procedure well without immediate postprocedural complication.  ANESTHESIA/SEDATION: Patient was maintained under general anesthesia from preceding ERCP.  CONTRAST:  25mL OMNIPAQUE IOHEXOL 300 MG/ML SOLN - administered into the biliary tree  COMPARISON:  MRCP -04/26/2013; CT abdomen pelvis - 04/25/2013  FLUOROSCOPY TIME:  2 minutes. 48  seconds.  COMPLICATIONS: None immediate  FINDINGS: Preprocedural ultrasound scanning demonstrates marked dilatation of the biliary tree as demonstrated on preceding MRCP and abdominal CT.  A preprocedural spot radiograph was obtained of the right upper abdominal quadrant demonstrating a minimal amount of residual contrast within dilated right posterior biliary ducts, an expected sequela of the preceding ERCP.  Under direct ultrasound guidance, a dilated duct within the peripheral anterior aspect of the left lobe of the liver was accessed ultimately allowing advancement of a 10 [place] biliary drainage catheter with an coiled and locked within the descending duodenum.  Limited post drainage catheter placement radiographs demonstrates malignant obstruction of the distal aspect of the common bile duct with moderate to severe dilatation of the opacified central biliary tree.   IMPRESSION: Successful placement of a 10.2 French percutaneous biliary drainage catheter with end coiled and locked within the duodenum.   Electronically Signed   By: Simonne Come M.D.   On: 05/02/2013 16:45   Ir US Guide Bx Asp/drain  05/02/2013   INDICATION: History obstructive lesion involving the distal aspect of the common bile along failed attempted endoscopic stent placement, now with obstructive jaundice  EXAM: ULTRASOUND AND FLUOROSCOPIC GUIDED PERCUTANEOUS TRANSHEPATIC CHOLANGIOGRAM AND BILIARY TUBE PLACEMENT  MEDICATIONS: Ciprofloxacin had been given during preceding ERCP.  TECHNIQUE: Informed written consent was obtained from  the patient's family after a discussion of the risks, benefits and alternatives to treatment. Questions regarding the procedure were encouraged and answered. A timeout was performed prior to the initiation of the procedure.  The right upper abdominal quadrant was prepped and draped in the usual sterile fashion, and a sterile drape was applied covering the operative field. Maximum barrier sterile technique with sterile gowns and gloves were used for the procedure. A timeout was performed prior to the initiation of the procedure. A preprocedural spot radiograph was obtained of the right upper abdominal quadrant.  Ultrasound scanning of the right upper abdominal quadrant was performed to delineate the anatomy. Under direct ultrasound guidance, a dilated anteriorly directed biliary duct within the peripheral aspect of the left lobe of the liver was accessed with a 22 gauge Chiba needle. Contrast injection confirmed appropriate intra-biliary puncture. The duct was cannulated with a Nitrex wire and the tract was dilated with an Accustick set.  The Accustick catheter was exchanged for a Kumpe catheter over a [employer] wire. With the use of a regular glide wire, the Kumpe catheter was advanced beyond the distal CB be obstruction and into the duodenum. Over an Amplatz stiff wire, the tract  was dilated and a 10.2 [place] biliary drainage catheter was advanced with coil ultimately locked within the duodenum.  Limited contrast injection was performed and postprocedural spot radiographs were obtained in various obliquities. The catheter was connected to a drainage bag which yielded the brisk return of clear bile. The catheter was secured to the skin with an interrupted suture. The patient tolerated the procedure well without immediate postprocedural complication.  ANESTHESIA/SEDATION: Patient was maintained under general anesthesia from preceding ERCP.  CONTRAST:  25mL OMNIPAQUE IOHEXOL 300 MG/ML SOLN - administered into the biliary tree  COMPARISON:  MRCP -04/26/2013; CT abdomen pelvis - 04/25/2013  FLUOROSCOPY TIME:  2 minutes. 48  seconds.  COMPLICATIONS: None immediate  FINDINGS: Preprocedural ultrasound scanning demonstrates marked dilatation of the biliary tree as demonstrated on preceding MRCP and abdominal CT.  A preprocedural spot radiograph was obtained of the right upper abdominal quadrant demonstrating a minimal amount of residual contrast within dilated right posterior biliary ducts, an expected sequela of the preceding ERCP.  Under direct ultrasound guidance, a dilated duct within the peripheral anterior aspect of the left lobe of the liver was accessed ultimately allowing advancement of a 10 [place] biliary drainage catheter with an coiled and locked within the descending duodenum.  Limited post drainage catheter placement radiographs demonstrates malignant obstruction of the distal aspect of the common bile duct with moderate to severe dilatation of the opacified central biliary tree.  IMPRESSION: Successful placement of a 10.2 French percutaneous biliary drainage catheter with end coiled and locked within the duodenum.   Electronically Signed   By: Simonne Come M.D.   On: 05/02/2013 16:45   Dg Ercp Biliary & Pancreatic Ducts  05/02/2013   CLINICAL DATA:  Obstructive jaundice.  EXAM: ERCP   TECHNIQUE: Multiple spot images obtained with the fluoroscopic device and submitted for interpretation post-procedure.  COMPARISON:  MRI 04/26/2013  FINDINGS: Four fluoroscopic spot images demonstrate cannulation of the common bile duct. There is a string sign consistent with an obstructing mass  IMPRESSION: Severe narrowing/ obstruction of the common bile duct.  These images were submitted for radiologic interpretation only. Please see the procedural report for the amount of contrast and the fluoroscopy time utilized.   Electronically Signed   By: Loralie Champagne M.D.   On: 05/02/2013 13:13  Mr Abd W/wo Cm/mrcp  04/27/2013   CLINICAL DATA:  Abdominal pain and elevated liver function studies. Marked biliary dilatation on recent CT scan.  EXAM: MRI ABDOMEN WITHOUT AND WITH CONTRAST (INCLUDING MRCP)  TECHNIQUE: Multiplanar multisequence MR imaging of the abdomen was performed both before and after the administration of intravenous contrast. Heavily T2-weighted images of the biliary and pancreatic ducts were obtained, and three-dimensional MRCP images were rendered by post processing.  CONTRAST:  13mL MULTIHANCE GADOBENATE DIMEGLUMINE 529 MG/ML IV SOLN  COMPARISON:  CT scan 04/25/2013.  FINDINGS: As demonstrated on the CT scan there is marked biliary dilatation. Significant intra hepatic biliary dilatation and a dilated common bile duct with an abrupt caliber change and marked compression just as the duct enters the head of the pancreas. There is an ill-defined infiltrating type lesion in the head and uncinate process region of the pancreas along with bulky celiac axis and retroperitoneal adenopathy. No pancreatic duct dilatation. Multiple hepatic metastatic lesions are again demonstrated.  Markedly dilated gallbladder with numerous gallstones and sludge.  The spleen is normal in size. No focal lesions. The adrenal glands and kidneys are unremarkable.  There are metastatic bone lesions. The largest lesions are in  the T9 and L3 vertebral bodies. Smaller lesions are noted in L4 and L5.  IMPRESSION: Infiltrating mass suspected in the head and uncinate process region of the pancreas obstructing the common bile duct and causing the intra and extrahepatic biliary dilatation. There is also bulky celiac axis and retroperitoneal lymphadenopathy and liver metastasis.  Metastatic bone disease.   Electronically Signed   By: Loralie Champagne M.D.   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 I will treat her thoracic spine as well as her lumbar spine to help alleviate some of the pain she is having and hopefully prevent per rush into frank cord compromise. We discussed the process of simulation the placement tattoos. We discussed a single treatment. We discussed the possibility of skin toxicity. I will try to minimize dose to the bowel in order to prevent nausea. We did discuss esophagitis. She has signed informed consent and agree to proceed forward. We discussed the low likelihood of permanent damage to critical structures.  I spent 40 minutes  face to face with the patient and more than 50% of that time was spent in counseling and/or coordination of care.   ------------------------------------------------  Lurline Hare, MD

## 2013-05-17 NOTE — Progress Notes (Signed)
[facility] Clinical Social Work  Clinical Social Work was referred by patient navigator for assessment of psychosocial needs due to new cancer diagnosis and possible coping concerns.  Clinical Social Worker met with patient and her husband, Merlyn Albert at [facility] after her appointment home to offer support and assess for needs.  They shared the recent challenges with Sue's new diagnosis, but are hopeful and eager for treatment to begin. They report to have two children, one who is a Holiday representative at [employer] and one that lives in [place]. They report to have an extensive support network of local friends and church family. CSW explained possible emotions, resources of the Support Team, support groups and additional coping techniques. Pt and husband are now aware of our services and how to contact us as needed. No other current, pressing concerns other than coping with cancer dx and treatment. Family appreciative and agree to contact CSW as needed.     Doreen Salvage, LCSW Clinical Social Worker [facility] Wednesday, Thursday and Friday Phone: [phone] Fax: [phone]

## 2013-05-17 NOTE — Telephone Encounter (Signed)
sch appts per 11/7 POF Email MW to sch Tx AVS and cal printed shh

## 2013-05-17 NOTE — Progress Notes (Signed)
Please see the Nurse Progress Note in the MD Initial Consult Encounter for this patient. 

## 2013-05-17 NOTE — Progress Notes (Addendum)
OFFICE PROGRESS NOTE  Interval history:  Bianca Logan returns for followup of recently diagnosed metastatic cholangiocarcinoma. Back pain is much better controlled. She is currently taking MS Contin with MSIR as needed. She noted a significant improvement in the pain since beginning dexamethasone. She notes intermittent "weakness" at the right thigh region. No numbness. She began Senna for constipation. She had 2 bowel movements yesterday. Her appetite has improved. She denies nausea/vomiting. No pruritus. Her husband feels the jaundice is stable. She denies fever, cough and shortness of breath.   Objective: Blood pressure 112/43, pulse 93, temperature 97.7 F (36.5 C), temperature source Oral, resp. rate 18, height 5\' 4"  (1.626 m), weight 135 lb 14.4 oz (61.644 kg).  Scleral icterus. No thrush. Lungs are clear. Regular cardiac rhythm. Abdomen soft and nontender. No hepatomegaly. No mass. External biliary drain. Legs without edema. Skin is jaundiced. Approximate 1 cm firm, mobile subcutaneous lesion at the right lateral scalp. No definite right breast mass. Motor strength 5 over 5. Knee DTRs 2+, symmetric.  Lab Results: Lab Results  Component Value Date   WBC 14.0* 05/16/2013   HGB 12.3 05/16/2013   HCT 35.2* 05/16/2013   MCV 82.1 05/16/2013   PLT 487* 05/16/2013    Chemistry:    Chemistry      Component Value Date/Time   NA 128* 05/16/2013 1010   K 3.9 05/16/2013 1010   CL 92* 05/16/2013 1010   CO2 26 05/16/2013 1010   BUN 11 05/16/2013 1010   CREATININE 0.52 05/16/2013 1010      Component Value Date/Time   CALCIUM 9.9 05/16/2013 1010   ALKPHOS 331* 05/16/2013 1010   AST 41* 05/16/2013 1010   ALT 101* 05/16/2013 1010   BILITOT 9.1* 05/16/2013 1010       Studies/Results: Ct Chest W Contrast  05/15/2013   CLINICAL DATA:  Patient with recent diagnosis of cholangiocarcinoma. Status post interval biliary drain placement.  EXAM: CT CHEST, ABDOMEN, AND PELVIS WITH CONTRAST  TECHNIQUE:  Multidetector CT imaging of the chest, abdomen and pelvis was performed following the standard protocol during bolus administration of intravenous contrast.  CONTRAST:  OMNIPAQUE IOHEXOL 300 MG/ML  SOLN  COMPARISON:  MRI abdomen 04/26/2013; CT 04/25/2013.  FINDINGS: CT CHEST FINDINGS  Visualized thyroid is unremarkable. There is a 1.3 cm soft tissue mass within the medial aspect of the right breast (image 16; series 2). There is a 1.9 cm left supraclavicular lymph node (image 7; series 2). Multiple enlarged mediastinal lymph nodes are demonstrated including a 1.4 cm superior mediastinal node (image 16; series 2); a 1.0 cm precarinal lymph node (image 23; series 2); and a 1.8 cm posterior mediastinal lymph node (image 29; series 2).  Normal heart size. No pericardial effusion. The central airways are patent. There is a 3 mm left upper lobe pulmonary nodule (image 29; series 5). No pleural effusion or pneumothorax.  CT ABDOMEN AND PELVIS FINDINGS  Interval increase in size of previously visualized low-attenuation lesions throughout the liver with a posterior right hepatic lobe lesion measuring 3.3 x 2.8 cm, previously 2.3 x 2.1 cm; a 4.1 x 4.4 cm hepatic dome lesion, previously 1.8 x 2.2 cm. Multiple additional new and enlarging lesions are demonstrated throughout the liver including a 2.0 cm right hepatic lesion, previously measuring 0.9 cm (image 64; series 2) as well as a new 1.8 cm subcapsular left hepatic lobe lesion.  Interval placement of a percutaneous biliary drain entering through the left hepatic lobe with the distal tip terminating  in the duodenum. There has been interval decompression of the right and left biliary tree. Small amount of pneumobilia. Portal vein is patent. Gallbladder is distended with persistent gallbladder wall thickening. Cholelithiasis.  The spleen and pancreas are grossly unremarkable. Re- demonstrated extensive porta hepatic lymphadenopathy including a 2.8 x 3.4 cm nodal  conglomerate, previously 2.7 x 2.2 cm. Extensive abnormal retroperitoneal soft tissue surrounding the upper abdominal aorta as well as the celiac axis and takeoff of the bilateral renal arteries.  The kidneys enhance symmetrically with contrast. There is a 2.5 cm right adrenal mass.  Additional bulky retroperitoneal lymphadenopathy is grossly similar when compared are prior examination. Urinary bladder is grossly unremarkable. Uterus is grossly unremarkable. Re- demonstrated 2.5 cm calcified lesion within the left hemipelvis. No abnormal bowel wall thickening. No evidence for bowel obstruction.  There is a 7 mm soft tissue nodule within the subcutaneous fat of the anterior abdominal wall.  Interval development of a pathologic compression fracture of the L3 vertebral body with extension of soft tissue components into the canal which is narrowed approximately 50%. Unchanged superior endplate deformity of the L1 vertebral body. Additionally there is metastatic involvement of the T9 vertebral body with small amount of extension into the vertebral canal at this level. Additionally there is a small amount of abnormal enhancing soft tissue demonstrated at the or T7 level with mild narrowing of the canal. There is also a 2.6 cm destructive lesion involving the lateral aspect of the left 3rd rib.  IMPRESSION: 1. Interval placement of percutaneous biliary drain with subsequent decompression of the intrahepatic and extrahepatic biliary system. Small amount a pneumobilia. 2. Findings compatible with diffuse metastatic disease which has progressed from the CT dated 04/25/2013. There are multiple new and enlarging hepatic metastatic lesions as well as worsening osseous metastatic disease. Additionally there is extensive porta hepatic, retroperitoneal, mediastinal and supraclavicular lymphadenopathy. 3. Multiple osseous metastatic lesions including a large soft tissue lesion with associated pathologic compression fracture of the  L3 vertebral body. Posterior soft tissue extension of this lesion narrows the spinal canal approximately 50%. 4. Soft tissue involvement of the spinal canal at the T9 and T7 levels with mild anterior compression on the cord at the T7 level. 5. Small left upper lobe pulmonary nodule. 6. Cholelithiasis and gallbladder wall thickening. Critical Value/emergent results were called by telephone at the time of interpretation on 05/15/2013 at 4:29 PM to [facility] , who verbally acknowledged these results.   Electronically Signed   By: Annia Belt M.D.   On: 05/15/2013 16:33   Ct Abdomen W Contrast  04/25/2013   CLINICAL DATA:  Upper abdominal pain, nausea, decreased appetite. Elevated LFTs.  EXAM: CT ABDOMEN WITH CONTRAST  TECHNIQUE: Multidetector CT imaging of the abdomen was performed using the standard protocol following bolus administration of intravenous contrast.  CONTRAST:  OMNIPAQUE IOHEXOL 300 MG/ML  SOLN  COMPARISON:  CT 12/15/2008  FINDINGS: Lung bases are clear. No effusions. Heart is normal size.  There is an in their scratch head there is an irregular solid-appearing lesion in the right hepatic dome measuring 18 mm on image 7 of series 2. Inferiorly in the right hepatic lobe, a 2nd solid-appearing lesion is noted measuring 2.3 cm on image 38.  Multiple layering gallstones noted within the gallbladder. There is gallbladder wall thickening measuring up to 8 mm near the fundus of the gallbladder. Significant intrahepatic biliary ductal dilatation. The common bile duct is difficult to visualize in the pancreas. The common bile duct  is dilated in the porta hepatis, within not visualized below the superior aspect of the pancreatic head.  Spleen, adrenals and kidneys are unremarkable. No visible focal pancreatic abnormality.  There is retroperitoneal adenopathy. Bulky lymph nodes bilaterally on image 25. Aortocaval node on image 25 measures 2.6 x 1.9 cm. Mildly prominent central mesenteric lymph  nodes. Ill-defined porta hepatis adenopathy, difficult to measure.  Moderate stool throughout the colon. Visualized small bowel is decompressed and unremarkable. Aorta is normal caliber.  Mild to moderate compression fracture through the superior endplate of L1. Tiny sclerotic focus within the inferior anterior aspect of L4, nonspecific. Cannot exclude a small sclerotic metastasis.  IMPRESSION: Solid appearing ill-defined lesions in the right lobe of the liver as described above concerning for metastases.  Marked intrahepatic and proximal extrahepatic biliary ductal dilatation. The common bile duct cannot be visualized below the superior aspect of the pancreatic head. The bili obstruction may be related to compression from enlarged porta hepatis lymph nodes. Cannot exclude a Klatskin tumor other central biliary malignancy. No definite pancreatic head obstructing mass noted.  There is a focal gallbladder wall thickening, most likely related to cholecystitis. Cannot completely exclude gallbladder cancer.  Cholelithiasis.  Bulky retroperitoneal and porta hepatis adenopathy.  Recommend MRI of the abdomen without and with contrast and MRCP for further evaluation.  These results were discussed with Dr. Jillyn Hidden at the time of interpretation.   Electronically Signed   By: Charlett Nose M.D.   On: 04/25/2013 16:47   Ct Abdomen Pelvis W Contrast  05/15/2013   CLINICAL DATA:  Patient with recent diagnosis of cholangiocarcinoma. Status post interval biliary drain placement.  EXAM: CT CHEST, ABDOMEN, AND PELVIS WITH CONTRAST  TECHNIQUE: Multidetector CT imaging of the chest, abdomen and pelvis was performed following the standard protocol during bolus administration of intravenous contrast.  CONTRAST:  OMNIPAQUE IOHEXOL 300 MG/ML  SOLN  COMPARISON:  MRI abdomen 04/26/2013; CT 04/25/2013.  FINDINGS: CT CHEST FINDINGS  Visualized thyroid is unremarkable. There is a 1.3 cm soft tissue mass within the medial aspect of the right  breast (image 16; series 2). There is a 1.9 cm left supraclavicular lymph node (image 7; series 2). Multiple enlarged mediastinal lymph nodes are demonstrated including a 1.4 cm superior mediastinal node (image 16; series 2); a 1.0 cm precarinal lymph node (image 23; series 2); and a 1.8 cm posterior mediastinal lymph node (image 29; series 2).  Normal heart size. No pericardial effusion. The central airways are patent. There is a 3 mm left upper lobe pulmonary nodule (image 29; series 5). No pleural effusion or pneumothorax.  CT ABDOMEN AND PELVIS FINDINGS  Interval increase in size of previously visualized low-attenuation lesions throughout the liver with a posterior right hepatic lobe lesion measuring 3.3 x 2.8 cm, previously 2.3 x 2.1 cm; a 4.1 x 4.4 cm hepatic dome lesion, previously 1.8 x 2.2 cm. Multiple additional new and enlarging lesions are demonstrated throughout the liver including a 2.0 cm right hepatic lesion, previously measuring 0.9 cm (image 64; series 2) as well as a new 1.8 cm subcapsular left hepatic lobe lesion.  Interval placement of a percutaneous biliary drain entering through the left hepatic lobe with the distal tip terminating in the duodenum. There has been interval decompression of the right and left biliary tree. Small amount of pneumobilia. Portal vein is patent. Gallbladder is distended with persistent gallbladder wall thickening. Cholelithiasis.  The spleen and pancreas are grossly unremarkable. Re- demonstrated extensive porta hepatic lymphadenopathy including a  2.8 x 3.4 cm nodal conglomerate, previously 2.7 x 2.2 cm. Extensive abnormal retroperitoneal soft tissue surrounding the upper abdominal aorta as well as the celiac axis and takeoff of the bilateral renal arteries.  The kidneys enhance symmetrically with contrast. There is a 2.5 cm right adrenal mass.  Additional bulky retroperitoneal lymphadenopathy is grossly similar when compared are prior examination. Urinary bladder is  grossly unremarkable. Uterus is grossly unremarkable. Re- demonstrated 2.5 cm calcified lesion within the left hemipelvis. No abnormal bowel wall thickening. No evidence for bowel obstruction.  There is a 7 mm soft tissue nodule within the subcutaneous fat of the anterior abdominal wall.  Interval development of a pathologic compression fracture of the L3 vertebral body with extension of soft tissue components into the canal which is narrowed approximately 50%. Unchanged superior endplate deformity of the L1 vertebral body. Additionally there is metastatic involvement of the T9 vertebral body with small amount of extension into the vertebral canal at this level. Additionally there is a small amount of abnormal enhancing soft tissue demonstrated at the or T7 level with mild narrowing of the canal. There is also a 2.6 cm destructive lesion involving the lateral aspect of the left 3rd rib.  IMPRESSION: 1. Interval placement of percutaneous biliary drain with subsequent decompression of the intrahepatic and extrahepatic biliary system. Small amount a pneumobilia. 2. Findings compatible with diffuse metastatic disease which has progressed from the CT dated 04/25/2013. There are multiple new and enlarging hepatic metastatic lesions as well as worsening osseous metastatic disease. Additionally there is extensive porta hepatic, retroperitoneal, mediastinal and supraclavicular lymphadenopathy. 3. Multiple osseous metastatic lesions including a large soft tissue lesion with associated pathologic compression fracture of the L3 vertebral body. Posterior soft tissue extension of this lesion narrows the spinal canal approximately 50%. 4. Soft tissue involvement of the spinal canal at the T9 and T7 levels with mild anterior compression on the cord at the T7 level. 5. Small left upper lobe pulmonary nodule. 6. Cholelithiasis and gallbladder wall thickening. Critical Value/emergent results were called by telephone at the time of  interpretation on 05/15/2013 at 4:29 PM to [employer] , who verbally acknowledged these results.   Electronically Signed   By: Annia Belt M.D.   On: 05/15/2013 16:33   Ir Cholan Exist Tube  05/16/2013   CLINICAL DATA:  62 year old with biliary obstruction and adenocarcinoma. Persistent elevation of the bilirubin level. Evaluate biliary system and evaluate for placement of internal stent.  EXAM: CHOLANGIOGRAM VIA EXISTING CATHETER; EXCHANGE OF BILIARY CATHETER WITH FLUOROSCOPY  Physician: Rachelle Hora. Henn, MD  MEDICATIONS: Versed 6 mg, fentanyl 300 mcg. A radiology nurse monitored the patient for moderate sedation. Zosyn 3.375 mg IV  ANESTHESIA/SEDATION: Moderate sedation time: 25 min  FLUOROSCOPY TIME:  6 min and 36 seconds  PROCEDURE: The procedure was explained to the patient. The risks and benefits of the procedure were discussed and the patient's questions were addressed. Informed consent was obtained from the patient. The patient was placed supine on the interventional table. The existing catheter and surrounding skin were prepped and draped in sterile fashion. Maximal barrier sterile technique was utilized including caps, mask, sterile gowns, sterile gloves, sterile drape, hand hygiene and skin antiseptic. Contrast was injected through the biliary drain. Biliary drain was cut and removed over a Glidewire. A 5 French Kumpe catheter was advanced into the duodenum and removed over a Bentson wire. A 9 French sheath was advanced into the left intrahepatic biliary system and additional cholangiogram images were  obtained. Based on the cholangiogram, decided to place a new biliary drain rather than an internal stent. A 12 [place] biliary drain was advanced over the [employer] wire. The distal aspect reconstituted in the duodenum. Contrast injection confirmed that the drain was patent and functioning well. Catheter was sutured to the skin. The catheter was flushed with normal saline and capped. Fluoroscopic images were  taken and saved for this procedure.  COMPLICATIONS: None  FINDINGS: The existing left internal/external biliary drain was well positioned. Cholangiogram demonstrated filling of the left and right intrahepatic biliary system. Biliary system was decompressed. There is irregularity and narrowing of the extrahepatic bile duct that extends near the hilum. Review of the prior cholangiogram, suggests that the extrahepatic duct disease may have slightly progressed towards the biliary hilum. No significant disease was identified in the intrahepatic bile ducts.  IMPRESSION: Exchange of the internal/external biliary drain. The new drain was capped.  Long segment irregularity and narrowing of the extrahepatic bile duct. Findings are suggestive for malignancy. The biliary disease may have slightly progressed since the prior study on 05/02/2013.   Electronically Signed   By: Richarda Overlie M.D.   On: 05/16/2013 14:54   Ir Biliary Drain Catheter Placement  05/02/2013   INDICATION: History obstructive lesion involving the distal aspect of the common bile along failed attempted endoscopic stent placement, now with obstructive jaundice  EXAM: ULTRASOUND AND FLUOROSCOPIC GUIDED PERCUTANEOUS TRANSHEPATIC CHOLANGIOGRAM AND BILIARY TUBE PLACEMENT  MEDICATIONS: Ciprofloxacin had been given during preceding ERCP.  TECHNIQUE: Informed written consent was obtained from the patient's family after a discussion of the risks, benefits and alternatives to treatment. Questions regarding the procedure were encouraged and answered. A timeout was performed prior to the initiation of the procedure.  The right upper abdominal quadrant was prepped and draped in the usual sterile fashion, and a sterile drape was applied covering the operative field. Maximum barrier sterile technique with sterile gowns and gloves were used for the procedure. A timeout was performed prior to the initiation of the procedure. A preprocedural spot radiograph was obtained of  the right upper abdominal quadrant.  Ultrasound scanning of the right upper abdominal quadrant was performed to delineate the anatomy. Under direct ultrasound guidance, a dilated anteriorly directed biliary duct within the peripheral aspect of the left lobe of the liver was accessed with a 22 gauge Chiba needle. Contrast injection confirmed appropriate intra-biliary puncture. The duct was cannulated with a Nitrex wire and the tract was dilated with an Accustick set.  The Accustick catheter was exchanged for a Kumpe catheter over a [employer] wire. With the use of a regular glide wire, the Kumpe catheter was advanced beyond the distal CB be obstruction and into the duodenum. Over an Amplatz stiff wire, the tract was dilated and a 10.2 [place] biliary drainage catheter was advanced with coil ultimately locked within the duodenum.  Limited contrast injection was performed and postprocedural spot radiographs were obtained in various obliquities. The catheter was connected to a drainage bag which yielded the brisk return of clear bile. The catheter was secured to the skin with an interrupted suture. The patient tolerated the procedure well without immediate postprocedural complication.  ANESTHESIA/SEDATION: Patient was maintained under general anesthesia from preceding ERCP.  CONTRAST:  25mL OMNIPAQUE IOHEXOL 300 MG/ML SOLN - administered into the biliary tree  COMPARISON:  MRCP -04/26/2013; CT abdomen pelvis - 04/25/2013  FLUOROSCOPY TIME:  2 minutes. 48  seconds.  COMPLICATIONS: None immediate  FINDINGS: Preprocedural ultrasound scanning demonstrates marked dilatation of  the biliary tree as demonstrated on preceding MRCP and abdominal CT.  A preprocedural spot radiograph was obtained of the right upper abdominal quadrant demonstrating a minimal amount of residual contrast within dilated right posterior biliary ducts, an expected sequela of the preceding ERCP.  Under direct ultrasound guidance, a dilated duct within the  peripheral anterior aspect of the left lobe of the liver was accessed ultimately allowing advancement of a 10 [place] biliary drainage catheter with an coiled and locked within the descending duodenum.  Limited post drainage catheter placement radiographs demonstrates malignant obstruction of the distal aspect of the common bile duct with moderate to severe dilatation of the opacified central biliary tree.  IMPRESSION: Successful placement of a 10.2 French percutaneous biliary drainage catheter with end coiled and locked within the duodenum.   Electronically Signed   By: Simonne Come M.D.   On: 05/02/2013 16:45   Ir Catheter Tube Change  05/16/2013   CLINICAL DATA:  62 year old with biliary obstruction and adenocarcinoma. Persistent elevation of the bilirubin level. Evaluate biliary system and evaluate for placement of internal stent.  EXAM: CHOLANGIOGRAM VIA EXISTING CATHETER; EXCHANGE OF BILIARY CATHETER WITH FLUOROSCOPY  Physician: Rachelle Hora. Henn, MD  MEDICATIONS: Versed 6 mg, fentanyl 300 mcg. A radiology nurse monitored the patient for moderate sedation. Zosyn 3.375 mg IV  ANESTHESIA/SEDATION: Moderate sedation time: 25 min  FLUOROSCOPY TIME:  6 min and 36 seconds  PROCEDURE: The procedure was explained to the patient. The risks and benefits of the procedure were discussed and the patient's questions were addressed. Informed consent was obtained from the patient. The patient was placed supine on the interventional table. The existing catheter and surrounding skin were prepped and draped in sterile fashion. Maximal barrier sterile technique was utilized including caps, mask, sterile gowns, sterile gloves, sterile drape, hand hygiene and skin antiseptic. Contrast was injected through the biliary drain. Biliary drain was cut and removed over a Glidewire. A 5 French Kumpe catheter was advanced into the duodenum and removed over a Bentson wire. A 9 French sheath was advanced into the left intrahepatic biliary system  and additional cholangiogram images were obtained. Based on the cholangiogram, decided to place a new biliary drain rather than an internal stent. A 12 [place] biliary drain was advanced over the [employer] wire. The distal aspect reconstituted in the duodenum. Contrast injection confirmed that the drain was patent and functioning well. Catheter was sutured to the skin. The catheter was flushed with normal saline and capped. Fluoroscopic images were taken and saved for this procedure.  COMPLICATIONS: None  FINDINGS: The existing left internal/external biliary drain was well positioned. Cholangiogram demonstrated filling of the left and right intrahepatic biliary system. Biliary system was decompressed. There is irregularity and narrowing of the extrahepatic bile duct that extends near the hilum. Review of the prior cholangiogram, suggests that the extrahepatic duct disease may have slightly progressed towards the biliary hilum. No significant disease was identified in the intrahepatic bile ducts.  IMPRESSION: Exchange of the internal/external biliary drain. The new drain was capped.  Long segment irregularity and narrowing of the extrahepatic bile duct. Findings are suggestive for malignancy. The biliary disease may have slightly progressed since the prior study on 05/02/2013.   Electronically Signed   By: Richarda Overlie M.D.   On: 05/16/2013 14:54   Mr 3d Recon At Scanner  04/27/2013   CLINICAL DATA:  Abdominal pain and elevated liver function studies. Marked biliary dilatation on recent CT scan.  EXAM: MRI ABDOMEN WITHOUT AND  WITH CONTRAST (INCLUDING MRCP)  TECHNIQUE: Multiplanar multisequence MR imaging of the abdomen was performed both before and after the administration of intravenous contrast. Heavily T2-weighted images of the biliary and pancreatic ducts were obtained, and three-dimensional MRCP images were rendered by post processing.  CONTRAST:  13mL MULTIHANCE GADOBENATE DIMEGLUMINE 529 MG/ML IV SOLN   COMPARISON:  CT scan 04/25/2013.  FINDINGS: As demonstrated on the CT scan there is marked biliary dilatation. Significant intra hepatic biliary dilatation and a dilated common bile duct with an abrupt caliber change and marked compression just as the duct enters the head of the pancreas. There is an ill-defined infiltrating type lesion in the head and uncinate process region of the pancreas along with bulky celiac axis and retroperitoneal adenopathy. No pancreatic duct dilatation. Multiple hepatic metastatic lesions are again demonstrated.  Markedly dilated gallbladder with numerous gallstones and sludge.  The spleen is normal in size. No focal lesions. The adrenal glands and kidneys are unremarkable.  There are metastatic bone lesions. The largest lesions are in the T9 and L3 vertebral bodies. Smaller lesions are noted in L4 and L5.  IMPRESSION: Infiltrating mass suspected in the head and uncinate process region of the pancreas obstructing the common bile duct and causing the intra and extrahepatic biliary dilatation. There is also bulky celiac axis and retroperitoneal lymphadenopathy and liver metastasis.  Metastatic bone disease.   Electronically Signed   By: Loralie Champagne M.D.   On: 04/27/2013 09:44   Ir Ptc  05/02/2013   INDICATION: History obstructive lesion involving the distal aspect of the common bile along failed attempted endoscopic stent placement, now with obstructive jaundice  EXAM: ULTRASOUND AND FLUOROSCOPIC GUIDED PERCUTANEOUS TRANSHEPATIC CHOLANGIOGRAM AND BILIARY TUBE PLACEMENT  MEDICATIONS: Ciprofloxacin had been given during preceding ERCP.  TECHNIQUE: Informed written consent was obtained from the patient's family after a discussion of the risks, benefits and alternatives to treatment. Questions regarding the procedure were encouraged and answered. A timeout was performed prior to the initiation of the procedure.  The right upper abdominal quadrant was prepped and draped in the usual  sterile fashion, and a sterile drape was applied covering the operative field. Maximum barrier sterile technique with sterile gowns and gloves were used for the procedure. A timeout was performed prior to the initiation of the procedure. A preprocedural spot radiograph was obtained of the right upper abdominal quadrant.  Ultrasound scanning of the right upper abdominal quadrant was performed to delineate the anatomy. Under direct ultrasound guidance, a dilated anteriorly directed biliary duct within the peripheral aspect of the left lobe of the liver was accessed with a 22 gauge Chiba needle. Contrast injection confirmed appropriate intra-biliary puncture. The duct was cannulated with a Nitrex wire and the tract was dilated with an Accustick set.  The Accustick catheter was exchanged for a Kumpe catheter over a [employer] wire. With the use of a regular glide wire, the Kumpe catheter was advanced beyond the distal CB be obstruction and into the duodenum. Over an Amplatz stiff wire, the tract was dilated and a 10.2 [place] biliary drainage catheter was advanced with coil ultimately locked within the duodenum.  Limited contrast injection was performed and postprocedural spot radiographs were obtained in various obliquities. The catheter was connected to a drainage bag which yielded the brisk return of clear bile. The catheter was secured to the skin with an interrupted suture. The patient tolerated the procedure well without immediate postprocedural complication.  ANESTHESIA/SEDATION: Patient was maintained under general anesthesia from preceding ERCP.  CONTRAST:  25mL OMNIPAQUE IOHEXOL 300 MG/ML SOLN - administered into the biliary tree  COMPARISON:  MRCP -04/26/2013; CT abdomen pelvis - 04/25/2013  FLUOROSCOPY TIME:  2 minutes. 48  seconds.  COMPLICATIONS: None immediate  FINDINGS: Preprocedural ultrasound scanning demonstrates marked dilatation of the biliary tree as demonstrated on preceding MRCP and abdominal CT.  A  preprocedural spot radiograph was obtained of the right upper abdominal quadrant demonstrating a minimal amount of residual contrast within dilated right posterior biliary ducts, an expected sequela of the preceding ERCP.  Under direct ultrasound guidance, a dilated duct within the peripheral anterior aspect of the left lobe of the liver was accessed ultimately allowing advancement of a 10 [place] biliary drainage catheter with an coiled and locked within the descending duodenum.  Limited post drainage catheter placement radiographs demonstrates malignant obstruction of the distal aspect of the common bile duct with moderate to severe dilatation of the opacified central biliary tree.  IMPRESSION: Successful placement of a 10.2 French percutaneous biliary drainage catheter with end coiled and locked within the duodenum.   Electronically Signed   By: Simonne Come M.D.   On: 05/02/2013 16:45   Ir US Guide Bx Asp/drain  05/02/2013   INDICATION: History obstructive lesion involving the distal aspect of the common bile along failed attempted endoscopic stent placement, now with obstructive jaundice  EXAM: ULTRASOUND AND FLUOROSCOPIC GUIDED PERCUTANEOUS TRANSHEPATIC CHOLANGIOGRAM AND BILIARY TUBE PLACEMENT  MEDICATIONS: Ciprofloxacin had been given during preceding ERCP.  TECHNIQUE: Informed written consent was obtained from the patient's family after a discussion of the risks, benefits and alternatives to treatment. Questions regarding the procedure were encouraged and answered. A timeout was performed prior to the initiation of the procedure.  The right upper abdominal quadrant was prepped and draped in the usual sterile fashion, and a sterile drape was applied covering the operative field. Maximum barrier sterile technique with sterile gowns and gloves were used for the procedure. A timeout was performed prior to the initiation of the procedure. A preprocedural spot radiograph was obtained of the right upper abdominal  quadrant.  Ultrasound scanning of the right upper abdominal quadrant was performed to delineate the anatomy. Under direct ultrasound guidance, a dilated anteriorly directed biliary duct within the peripheral aspect of the left lobe of the liver was accessed with a 22 gauge Chiba needle. Contrast injection confirmed appropriate intra-biliary puncture. The duct was cannulated with a Nitrex wire and the tract was dilated with an Accustick set.  The Accustick catheter was exchanged for a Kumpe catheter over a [employer] wire. With the use of a regular glide wire, the Kumpe catheter was advanced beyond the distal CB be obstruction and into the duodenum. Over an Amplatz stiff wire, the tract was dilated and a 10.2 [place] biliary drainage catheter was advanced with coil ultimately locked within the duodenum.  Limited contrast injection was performed and postprocedural spot radiographs were obtained in various obliquities. The catheter was connected to a drainage bag which yielded the brisk return of clear bile. The catheter was secured to the skin with an interrupted suture. The patient tolerated the procedure well without immediate postprocedural complication.  ANESTHESIA/SEDATION: Patient was maintained under general anesthesia from preceding ERCP.  CONTRAST:  25mL OMNIPAQUE IOHEXOL 300 MG/ML SOLN - administered into the biliary tree  COMPARISON:  MRCP -04/26/2013; CT abdomen pelvis - 04/25/2013  FLUOROSCOPY TIME:  2 minutes. 48  seconds.  COMPLICATIONS: None immediate  FINDINGS: Preprocedural ultrasound scanning demonstrates marked dilatation of the biliary tree as  demonstrated on preceding MRCP and abdominal CT.  A preprocedural spot radiograph was obtained of the right upper abdominal quadrant demonstrating a minimal amount of residual contrast within dilated right posterior biliary ducts, an expected sequela of the preceding ERCP.  Under direct ultrasound guidance, a dilated duct within the peripheral anterior aspect of  the left lobe of the liver was accessed ultimately allowing advancement of a 10 [place] biliary drainage catheter with an coiled and locked within the descending duodenum.  Limited post drainage catheter placement radiographs demonstrates malignant obstruction of the distal aspect of the common bile duct with moderate to severe dilatation of the opacified central biliary tree.  IMPRESSION: Successful placement of a 10.2 French percutaneous biliary drainage catheter with end coiled and locked within the duodenum.   Electronically Signed   By: Simonne Come M.D.   On: 05/02/2013 16:45   Dg Ercp Biliary & Pancreatic Ducts  05/02/2013   CLINICAL DATA:  Obstructive jaundice.  EXAM: ERCP  TECHNIQUE: Multiple spot images obtained with the fluoroscopic device and submitted for interpretation post-procedure.  COMPARISON:  MRI 04/26/2013  FINDINGS: Four fluoroscopic spot images demonstrate cannulation of the common bile duct. There is a string sign consistent with an obstructing mass  IMPRESSION: Severe narrowing/ obstruction of the common bile duct.  These images were submitted for radiologic interpretation only. Please see the procedural report for the amount of contrast and the fluoroscopy time utilized.   Electronically Signed   By: Loralie Champagne M.D.   On: 05/02/2013 13:13   Mr Abd W/wo Cm/mrcp  04/27/2013   CLINICAL DATA:  Abdominal pain and elevated liver function studies. Marked biliary dilatation on recent CT scan.  EXAM: MRI ABDOMEN WITHOUT AND WITH CONTRAST (INCLUDING MRCP)  TECHNIQUE: Multiplanar multisequence MR imaging of the abdomen was performed both before and after the administration of intravenous contrast. Heavily T2-weighted images of the biliary and pancreatic ducts were obtained, and three-dimensional MRCP images were rendered by post processing.  CONTRAST:  13mL MULTIHANCE GADOBENATE DIMEGLUMINE 529 MG/ML IV SOLN  COMPARISON:  CT scan 04/25/2013.  FINDINGS: As demonstrated on the CT scan there  is marked biliary dilatation. Significant intra hepatic biliary dilatation and a dilated common bile duct with an abrupt caliber change and marked compression just as the duct enters the head of the pancreas. There is an ill-defined infiltrating type lesion in the head and uncinate process region of the pancreas along with bulky celiac axis and retroperitoneal adenopathy. No pancreatic duct dilatation. Multiple hepatic metastatic lesions are again demonstrated.  Markedly dilated gallbladder with numerous gallstones and sludge.  The spleen is normal in size. No focal lesions. The adrenal glands and kidneys are unremarkable.  There are metastatic bone lesions. The largest lesions are in the T9 and L3 vertebral bodies. Smaller lesions are noted in L4 and L5.  IMPRESSION: Infiltrating mass suspected in the head and uncinate process region of the pancreas obstructing the common bile duct and causing the intra and extrahepatic biliary dilatation. There is also bulky celiac axis and retroperitoneal lymphadenopathy and liver metastasis.  Metastatic bone disease.   Electronically Signed   By: Loralie Champagne M.D.   On: 04/27/2013 09:44    Medications: I have reviewed the patient's current medications.  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 Staging chest CT 05/15/2013 showed a 1.3 cm soft tissue mass within the medial aspect of the right  breast; 1.9 cm left supraclavicular lymph node; multiple enlarged mediastinal lymph nodes; 3 mm left upper lobe pulmonary nodule. Repeat CT abdomen and pelvis 05/15/2013 showed interval increase in the size of previously visualized lesions throughout the liver and multiple additional new lesions; bulky retroperitoneal lymphadenopathy was stable; extensive porta hepatis lymphadenopathy was redemonstrated; 7 mm  soft tissue nodule in the subcutaneous fat of the anterior abdominal wall; 2.5 cm calcified lesion within the left hemipelvis; interval development of a pathologic compression fracture of the L3 vertebral body with extension of soft tissue components into the canal which was narrowed approximately 50%; metastatic involvement of the T9 vertebral body with small amount of extension into the vertebral canal; small amount of abnormal enhancing soft tissue at T7 with mild narrowing of the canal; destructive 2.6 cm lesion involving the lateral aspect of the left third rib. 2. Obstructive jaundice secondary to #1 status post placement of a percutaneous biliary drain on 05/02/2013. Cholangiogram 05/16/2013 showed the existing left internal/external biliary drain was well positioned. The biliary system was decompressed. The internal/external biliary drain was exchanged. 3. Pain secondary to bone metastases involving the lumbar spine, retroperitoneal adenopathy. The pain improved with initiation of dexamethasone. She is taking MS Contin with MSIR as needed. She has seen Dr. Michell Heinrich with treatment planned to the thoracic and lumbar spine. 4. Anorexia/weight loss secondary to #1. 5. Subcutaneous lesion right scalp. Question lymph node.  Disposition-Bianca Logan was recently diagnosed with metastatic cholangiocarcinoma. The recent CT scans show significant progression of liver metastases and worsening of bone metastases.  Back pain is better since initiating dexamethasone. She is taking MS Contin with MSIR as needed. She is scheduled for palliative radiation on 05/21/2013.  Dr. Truett Perna recommends initiation of systemic therapy with gemcitabine/oxaliplatin on a 2 week schedule. We reviewed potential toxicities associated with chemotherapy including myelosuppression, nausea, mouth sores, hair loss. We reviewed potential toxicities associated with gemcitabine including fever, skin rash, pneumonitis. We reviewed the  potential for neurotoxicity associated with oxaliplatin including cold sensitivity, peripheral neuropathy, acute laryngopharyngeal dysesthesia and more rare occurrences such as diplopia, ataxia, incontinence of urine. She will attend a chemotherapy education class. She will be scheduled for cycle 1 gemcitabine/oxaliplatin on 05/22/2013. We will obtain a CEA and CA 19-9 prior to treatment on 05/22/2013.  We will see her in followup on 06/05/2013 prior to cycle 2. She will contact the office prior to her next visit with any problems.  Patient seen with Dr. Truett Perna.  60 minutes were spent face to face at today's visit with the majority of that time involving counseling/coordination of care.    Lonna Cobb ANP/GNP-BC  This was a shared visit with Lonna Cobb. We reviewed the staging CT scans with Bianca Logan and her husband. She was interviewed and examined.  The clinical presentation is most consistent with metastatic cholangiocarcinoma. She will receive a single fraction of palliative radiation to the spine on 05/21/2013. The plan is to begin gemcitabine/oxaliplatin chemotherapy on 05/22/2013. We reviewed the potential toxicities associated with this chemotherapy regimen. We discussed the potential for increased toxicity with the hyperbilirubinemia.  Mancel Bale, M.D.

## 2013-05-19 ENCOUNTER — Other Ambulatory Visit: Payer: Self-pay | Admitting: Oncology

## 2013-05-20 ENCOUNTER — Telehealth: Payer: Self-pay | Admitting: *Deleted

## 2013-05-20 ENCOUNTER — Telehealth: Payer: Self-pay | Admitting: Oncology

## 2013-05-20 ENCOUNTER — Ambulatory Visit: Admission: RE | Admit: 2013-05-20 | Payer: BC Managed Care – PPO | Source: Ambulatory Visit

## 2013-05-20 NOTE — Telephone Encounter (Signed)
Per staff message and POF I have scheduled appts.  JMW  

## 2013-05-20 NOTE — Telephone Encounter (Signed)
called pt and went over schedule since chemo now added shh

## 2013-05-21 ENCOUNTER — Ambulatory Visit: Payer: BC Managed Care – PPO

## 2013-05-21 ENCOUNTER — Encounter: Payer: Self-pay | Admitting: Radiation Oncology

## 2013-05-21 ENCOUNTER — Other Ambulatory Visit: Payer: Self-pay

## 2013-05-21 ENCOUNTER — Ambulatory Visit
Admission: RE | Admit: 2013-05-21 | Discharge: 2013-05-21 | Disposition: A | Discharge status: Home / Self Care | Payer: BC Managed Care – PPO | Source: Ambulatory Visit | Attending: Radiation Oncology | Admitting: Radiation Oncology

## 2013-05-21 ENCOUNTER — Ambulatory Visit: Payer: BC Managed Care – PPO | Admitting: Nutrition

## 2013-05-21 DIAGNOSIS — C7951 Secondary malignant neoplasm of bone: Secondary | ICD-10-CM

## 2013-05-21 DIAGNOSIS — C801 Malignant (primary) neoplasm, unspecified: Secondary | ICD-10-CM

## 2013-05-21 DIAGNOSIS — Z923 Personal history of irradiation: Secondary | ICD-10-CM

## 2013-05-21 HISTORY — DX: Personal history of irradiation: Z92.3

## 2013-05-21 MED ORDER — PROCHLORPERAZINE MALEATE 10 MG PO TABS: 10.0000 mg | ORAL_TABLET | Freq: Four times a day (QID) | ORAL | Status: DC | PRN

## 2013-05-21 NOTE — Progress Notes (Signed)
See patient education tab

## 2013-05-21 NOTE — Progress Notes (Signed)
  Radiation Oncology         [id number]) [phone] ________________________________  Name: Bianca Logan MRN: [id number]  Date: 05/21/2013  DOB: January 26, 1951  Simulation Verification Note  Status: outpatient  NARRATIVE: The patient was brought to the treatment unit and placed in the planned treatment position. The clinical setup was verified. Then port films were obtained and uploaded to the radiation oncology medical record software.  The treatment beams were carefully compared against the planned radiation fields. The position location and shape of the radiation fields was reviewed. The targeted volume of tissue appears appropriately covered by the radiation beams. Organs at risk appear to be excluded as planned.  Based on my personal review, I approved the simulation verification. The patient's treatment will proceed as planned.  ------------------------------------------------  Lurline Hare, MD

## 2013-05-21 NOTE — Progress Notes (Signed)
Patient is a 62 year old female diagnosed with metastatic cholangiocarcinoma.  She is a patient of Dr. Truett Perna.  Past medical history includes GERD, and jaundice.  Medications include Os-Cal, Decadron, multivitamin, Prilosec, Senokot-S, and MiraLax.  Labs include sodium of 128, glucose of 110, albumin 2.5 on November 6.  Height: 64 inches. Weight: 135.9 pounds November 7. Usual body weight: 148 pounds April 2014. BMI: 23.32.  Patient presents to nutrition consult with her husband.  She has just received single palliative radiation therapy.  She reports that she did have a poor appetite, and now her appetite has improved.  She generally consumes a healthy diet.  She enjoys protein foods.  Patient has lost 8% of her usual body weight over the past 7 months.  Nutrition diagnosis: Food and nutrition related knowledge deficit related to new diagnosis of cholangiocarcinoma and associated treatments as evidenced by no prior need for nutrition related information.  Intervention: Patient educated to increase calories and protein with meals and snacks throughout the day.  She is to strive for weight maintenance.  I have reviewed, high-protein foods with her.  I've educated her on strategies for dealing with chemotherapy related side effects, such as nausea, vomiting, and mouth sores.  She was encouraged to take medications as prescribed.  Fact sheets were provided.  My contact information was given.  Teach back method used.  Monitoring, evaluation, goals: Patient will tolerate calories and protein to meet estimated nutrition needs to promote weight maintenance.   Next visit: I will follow patient as needed throughout treatment.

## 2013-05-22 ENCOUNTER — Ambulatory Visit (HOSPITAL_BASED_OUTPATIENT_CLINIC_OR_DEPARTMENT_OTHER): Payer: BC Managed Care – PPO | Admitting: Lab

## 2013-05-22 ENCOUNTER — Ambulatory Visit (HOSPITAL_BASED_OUTPATIENT_CLINIC_OR_DEPARTMENT_OTHER): Payer: BC Managed Care – PPO

## 2013-05-22 VITALS — BP 122/72 | HR 105 | Temp 98.6°F | Resp 20

## 2013-05-22 DIAGNOSIS — C801 Malignant (primary) neoplasm, unspecified: Secondary | ICD-10-CM

## 2013-05-22 DIAGNOSIS — C221 Intrahepatic bile duct carcinoma: Secondary | ICD-10-CM

## 2013-05-22 DIAGNOSIS — C787 Secondary malignant neoplasm of liver and intrahepatic bile duct: Secondary | ICD-10-CM

## 2013-05-22 DIAGNOSIS — C7951 Secondary malignant neoplasm of bone: Secondary | ICD-10-CM

## 2013-05-22 DIAGNOSIS — Z5111 Encounter for antineoplastic chemotherapy: Secondary | ICD-10-CM

## 2013-05-22 LAB — CBC WITH DIFFERENTIAL/PLATELET
BASO%: 0.3 % (ref 0.0–2.0)
Eosinophils Absolute: 0 10*3/uL (ref 0.0–0.5)
HGB: 12.3 g/dL (ref 11.6–15.9)
LYMPH%: 5 % — ABNORMAL LOW (ref 14.0–49.7)
MCHC: 33.7 g/dL (ref 31.5–36.0)
MONO#: 0.8 10*3/uL (ref 0.1–0.9)
NEUT#: 15.3 10*3/uL — ABNORMAL HIGH (ref 1.5–6.5)
Platelets: 567 10*3/uL — ABNORMAL HIGH (ref 145–400)
RBC: 4.39 10*6/uL (ref 3.70–5.45)
WBC: 17.1 10*3/uL — ABNORMAL HIGH (ref 3.9–10.3)
lymph#: 0.9 10*3/uL (ref 0.9–3.3)

## 2013-05-22 LAB — COMPREHENSIVE METABOLIC PANEL (CC13)
ALT: 114 U/L — ABNORMAL HIGH (ref 0–55)
AST: 45 U/L — ABNORMAL HIGH (ref 5–34)
Albumin: 2.3 g/dL — ABNORMAL LOW (ref 3.5–5.0)
Anion Gap: 9 mEq/L (ref 3–11)
CO2: 25 mEq/L (ref 22–29)
Calcium: 9.2 mg/dL (ref 8.4–10.4)
Chloride: 98 mEq/L (ref 98–109)
Creatinine: 0.6 mg/dL (ref 0.6–1.1)
Glucose: 134 mg/dl (ref 70–140)
Potassium: 4 mEq/L (ref 3.5–5.1)
Sodium: 131 mEq/L — ABNORMAL LOW (ref 136–145)
Total Bilirubin: 4.73 mg/dL — ABNORMAL HIGH (ref 0.20–1.20)
Total Protein: 6.4 g/dL (ref 6.4–8.3)

## 2013-05-22 MED ORDER — DEXAMETHASONE SODIUM PHOSPHATE 10 MG/ML IJ SOLN
INTRAMUSCULAR | Status: AC
  Filled 2013-05-22: qty 1

## 2013-05-22 MED ORDER — DEXTROSE 5 % IV SOLN
Freq: Once | INTRAVENOUS | Status: AC
  Administered 2013-05-22: 14:00:00 via INTRAVENOUS

## 2013-05-22 MED ORDER — DEXAMETHASONE SODIUM PHOSPHATE 10 MG/ML IJ SOLN
10.0000 mg | Freq: Once | INTRAMUSCULAR | Status: AC
  Administered 2013-05-22: 10 mg via INTRAVENOUS

## 2013-05-22 MED ORDER — ONDANSETRON 8 MG/50ML IVPB (CHCC)
8.0000 mg | Freq: Once | INTRAVENOUS | Status: AC
  Administered 2013-05-22: 8 mg via INTRAVENOUS

## 2013-05-22 MED ORDER — OXALIPLATIN CHEMO INJECTION 100 MG/20ML
80.0000 mg/m2 | Freq: Once | INTRAVENOUS | Status: AC
  Administered 2013-05-22: 135 mg via INTRAVENOUS
  Filled 2013-05-22: qty 27

## 2013-05-22 MED ORDER — SODIUM CHLORIDE 0.9 % IV SOLN
800.0000 mg/m2 | Freq: Once | INTRAVENOUS | Status: AC
  Administered 2013-05-22: 1330 mg via INTRAVENOUS
  Filled 2013-05-22: qty 34.98

## 2013-05-22 MED ORDER — ONDANSETRON 8 MG/NS 50 ML IVPB
INTRAVENOUS | Status: AC
  Filled 2013-05-22: qty 8

## 2013-05-22 NOTE — Patient Instructions (Signed)
 Cancer Center Discharge Instructions for Patients Receiving Chemotherapy  Today you received the following chemotherapy agents: Gemzar (Gemcitabine) and Oxaliplatin   To help prevent nausea and vomiting after your treatment, we encourage you to take your nausea medication as prescribed. You received Ondansetron (Zofran) and Dexamethasone (steroid) at 2:15 in the infusion room today, 05/22/13.   If you develop nausea and vomiting that is not controlled by your nausea medication, call the clinic.   BELOW ARE SYMPTOMS THAT SHOULD BE REPORTED IMMEDIATELY:  *FEVER GREATER THAN 100.5 F  *CHILLS WITH OR WITHOUT FEVER  NAUSEA AND VOMITING THAT IS NOT CONTROLLED WITH YOUR NAUSEA MEDICATION  *UNUSUAL SHORTNESS OF BREATH  *UNUSUAL BRUISING OR BLEEDING  TENDERNESS IN MOUTH AND THROAT WITH OR WITHOUT PRESENCE OF ULCERS  *URINARY PROBLEMS  *BOWEL PROBLEMS  UNUSUAL RASH Items with * indicate a potential emergency and should be followed up as soon as possible.  Feel free to call the clinic you have any questions or concerns. The clinic phone number is [phone].

## 2013-05-22 NOTE — Progress Notes (Signed)
Patient is here for c1d1 gem/oxali, arrived via wheelchair with ace bandage to left lower leg. Patient stated prior to diagnosis, she was training for 1/2 marathon, then was diagnosed and developed significant left hip pain so leg was already weak. She "got caught" on a rug and "twisted" her left leg, injuring her left lower leg. She has not reported this to anyone or had scans. She has been resting it and wrapping it prn but does not sleep in ace wrap. Denies calf tenderness. Skin intact, no redness, breakdown, tears, or obvious swelling, no obvious bone abnormalities. She bears little weight and when she flexes foot she gives it 3/10 pain. She wanted to inform us because, she stated, "there may be cancer there". She will see Dr. Truett Perna in clinic 06/05/13.

## 2013-05-23 ENCOUNTER — Telehealth: Payer: Self-pay | Admitting: *Deleted

## 2013-05-23 LAB — CANCER ANTIGEN 19-9: CA 19-9: <1.2 U/mL (ref <35.0)

## 2013-05-23 NOTE — Telephone Encounter (Signed)
Message copied by Wandalee Ferdinand on Thu May 23, 2013  2:51 PM ------      Message from: Thornton Papas B      Created: Wed May 22, 2013  6:00 PM       Please call patient, bilirubin is better, get me or lisa to check her leg 11/14 ------

## 2013-05-23 NOTE — Telephone Encounter (Signed)
Made patient aware that her bili was better and MD will see her tomorrow at 1015 to check her leg.

## 2013-05-24 ENCOUNTER — Telehealth: Payer: Self-pay | Admitting: Oncology

## 2013-05-24 ENCOUNTER — Other Ambulatory Visit: Payer: Self-pay | Admitting: *Deleted

## 2013-05-24 ENCOUNTER — Ambulatory Visit (HOSPITAL_BASED_OUTPATIENT_CLINIC_OR_DEPARTMENT_OTHER): Payer: BC Managed Care – PPO | Admitting: Oncology

## 2013-05-24 ENCOUNTER — Ambulatory Visit (HOSPITAL_COMMUNITY)
Admission: RE | Admit: 2013-05-24 | Discharge: 2013-05-24 | Disposition: A | Discharge status: Home / Self Care | Payer: BC Managed Care – PPO | Source: Ambulatory Visit | Attending: Oncology | Admitting: Oncology

## 2013-05-24 ENCOUNTER — Telehealth: Payer: Self-pay | Admitting: *Deleted

## 2013-05-24 VITALS — BP 105/61 | HR 116 | Temp 98.2°F | Resp 17 | Ht 64.0 in

## 2013-05-24 DIAGNOSIS — C7951 Secondary malignant neoplasm of bone: Secondary | ICD-10-CM

## 2013-05-24 DIAGNOSIS — C787 Secondary malignant neoplasm of liver and intrahepatic bile duct: Secondary | ICD-10-CM

## 2013-05-24 DIAGNOSIS — G893 Neoplasm related pain (acute) (chronic): Secondary | ICD-10-CM

## 2013-05-24 DIAGNOSIS — M899 Disorder of bone, unspecified: Secondary | ICD-10-CM | POA: Insufficient documentation

## 2013-05-24 DIAGNOSIS — C221 Intrahepatic bile duct carcinoma: Secondary | ICD-10-CM

## 2013-05-24 DIAGNOSIS — M25859 Other specified joint disorders, unspecified hip: Secondary | ICD-10-CM | POA: Insufficient documentation

## 2013-05-24 DIAGNOSIS — M25559 Pain in unspecified hip: Secondary | ICD-10-CM | POA: Insufficient documentation

## 2013-05-24 MED ORDER — LIDOCAINE-PRILOCAINE 2.5-2.5 % EX CREA: 1.0000 "application " | TOPICAL_CREAM | CUTANEOUS | Status: DC | PRN

## 2013-05-24 NOTE — Progress Notes (Signed)
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 She was treated with single fraction palliative radiation on 05/21/2013. She completed a first cycle of gemcitabine/oxaliplatin on 05/22/2013. She reports tolerating the chemotherapy well. No nausea/vomiting or mouth sores. She continues to have pain at the right lower back and notes "stiffness "in the right hip. The pain is relieved with MS Contin and MSIR. Her bowels are functioning.  She complains of pain and tenderness at the left lower leg. She "twisted "the leg over the past week when going to the bathroom. She has noted discomfort in this area for the past month.  There was burning at the IV site with the gemcitabine infusion.  Objective:  Vital signs in last 24 hours:  Blood pressure 105/61, pulse 116, temperature 98.2 F (36.8 C), temperature source Oral, resp. rate 17, height 5\' 4"  (1.626 m), weight 0 lb (0 kg), SpO2 100.00%.    HEENT: Mild scleral icterus, early ulceration at the palate, no thrush Resp: Lungs clear bilaterally Cardio: Regular rate and rhythm GI: No hepatomegaly, nontender, gauze dressing at the upper abdomen drain site Vascular: No leg edema or erythema. Small palpable cord at the right forearm IV site. Musculoskeletal: Mild pain with rotation of the right hip, no tenderness over the trochanter. Tenderness at the left lower tibia without a mass or erythema. Skin: Mild jaundice     Lab Results:  Lab Results  Component Value Date   WBC 17.1* 05/22/2013   HGB 12.3 05/22/2013   HCT 36.6 05/22/2013   MCV 83.3 05/22/2013   PLT 567* 05/22/2013   Creatinine 0.6, bilirubin 4.73, alkaline phosphatase 332   Medications: I have reviewed the patient's current medications.  Assessment/Plan: 1. Metastatic cholangiocarcinoma presenting with an obstructing distal bile duct tumor and imaging evidence of metastatic  abdominal/retroperitoneal adenopathy, liver metastases and bone metastases. Staging chest CT 05/15/2013 showed a 1.3 cm soft tissue mass within the medial aspect of the right breast; 1.9 cm left supraclavicular lymph node; multiple enlarged mediastinal lymph nodes; 3 mm left upper lobe pulmonary nodule. Repeat CT abdomen and pelvis 05/15/2013 showed interval increase in the size of previously visualized lesions throughout the liver and multiple additional new lesions; bulky retroperitoneal lymphadenopathy was stable; extensive porta hepatis lymphadenopathy was redemonstrated; 7 mm soft tissue nodule in the subcutaneous fat of the anterior abdominal wall; 2.5 cm calcified lesion within the left hemipelvis; interval development of a pathologic compression fracture of the L3 vertebral body with extension of soft tissue components into the canal which was narrowed approximately 50%; metastatic involvement of the T9 vertebral body with small amount of extension into the vertebral canal; small amount of abnormal enhancing soft tissue at T7 with mild narrowing of the canal; destructive 2.6 cm lesion involving the lateral aspect of the left third rib.  Palliative radiation to T9 and L3 on 05/21/2013  Cycle 1 gemcitabine/oxaliplatin on 05/22/2013 2. Obstructive jaundice secondary to #1 status post placement of a percutaneous biliary drain on 05/02/2013. Cholangiogram 05/16/2013 showed the existing left internal/external biliary drain was well positioned. The biliary system was decompressed. The internal/external biliary drain was exchanged. The jaundice has improved. 3. Pain secondary to bone metastases involving the lumbar spine, retroperitoneal adenopathy. The pain improved with initiation of dexamethasone. She is taking MS Contin with MSIR as needed. 4. Anorexia/weight loss secondary to #1. 5. Subcutaneous lesion right scalp. Question lymph node. 6. Pain/tenderness at the  left lower tibia-she will be referred for  a plain x-ray today  Disposition:  Her pain appears to be under better control. She appears to have tolerated the chemotherapy without significant acute toxicity. She will return for cycle 2 gemcitabine/oxaliplatin on 06/05/2013.  She will be referred for placement of a Port-A-Cath prior to cycle 2.  Ms. Wachter will continue the current narcotic pain regimen. We decrease the Decadron to once daily.  She will be referred for plain x-rays of the right hip and left tibia today   Thornton Papas, MD  05/24/2013  11:48 AM

## 2013-05-24 NOTE — Patient Instructions (Signed)
For diarrhea related to chemotherapy, hold the Senna and Miralax as needed. Use Imodium 2 mg tabs-take 1-2 after each diarrhea stool for maxmium of 8 tabs/day. Call if diarrhea is not controlled with this.

## 2013-05-24 NOTE — Telephone Encounter (Signed)
Per staff phone call and POF I have schedueld appts.  JMW  

## 2013-05-24 NOTE — Progress Notes (Signed)
Spoke with patient/husband about PT/OT referral to assist with home environment and strengthening exercises and lifting techniques for husband to assist with her care. Currently use transport chair in home and he is carrying her up the stairs to bed at night (2 story home). They declined at this time.

## 2013-05-24 NOTE — Telephone Encounter (Signed)
Gave pt appt for lab,md and chemo for December 2014 °

## 2013-05-27 ENCOUNTER — Encounter (HOSPITAL_BASED_OUTPATIENT_CLINIC_OR_DEPARTMENT_OTHER): Payer: Self-pay | Admitting: *Deleted

## 2013-05-27 ENCOUNTER — Telehealth: Payer: Self-pay | Admitting: *Deleted

## 2013-05-27 ENCOUNTER — Other Ambulatory Visit: Payer: Self-pay | Admitting: Orthopaedic Surgery

## 2013-05-27 ENCOUNTER — Other Ambulatory Visit: Payer: Self-pay | Admitting: Radiology

## 2013-05-27 NOTE — Telephone Encounter (Signed)
Received phone call from patient stating she is having orthopedic surgery 05/28/13 and would like to cancel the port a cath placement for 05/29/13.  She stated she does not want to delay her next chemo treatment and is willing to wait to have port placed in a few weeks.  This message was given to Dr. Kalman Drape nurse, Archie Patten.

## 2013-05-27 NOTE — Telephone Encounter (Signed)
Received call from patient stating she is having orthopedic  surgery on 05/28/13.  Her port a cath is scheduled for  For

## 2013-05-27 NOTE — H&P (Signed)
Bianca Logan is an 62 y.o. female.   Chief Complaint: Left leg pain HPI: This is a 62 years old, retired woman who we have seen in consultation of Dr. Truett Perna due to the fact that she has left leg pain and bone cancer.  She was diagnosed about a month and a half ago with his cancer and is starting chemotherapy.  She had been training and running and has been experiencing left leg pain, especially after a 5K.  A mass was noted with eroding cortices.  This lesion on imaging shows lytic lesions in the mid and distal portions of the tibia with eroding cortices.  Films reviewed on taken on 05/24/13.  We have discussed with her proceeding with intramedullary stabilization.  Past Medical History  Diagnosis Date  . 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 GERD (gastroesophageal reflux disease)   . Cancer 05/02/13    cholangiocarcinoma  . Bone cancer 05/15/13    Bone mets  . Contact lens/glasses fitting     wears contacts or glasses    Past Surgical History  Procedure Laterality Date  . Cyst removed from left breast  01/1999  . Ercp N/A 05/02/2013    Procedure: ENDOSCOPIC RETROGRADE CHOLANGIOPANCREATOGRAPHY (ERCP);  Surgeon: Willis Modena, MD;  Location: [facility];  Service: Endoscopy;  Laterality: N/A;  . Biliary stent placement N/A 05/02/2013    Procedure: BILIARY STENT PLACEMENT;  Surgeon: Willis Modena, MD;  Location: [facility];  Service: Endoscopy;  Laterality: N/A;  . Eus N/A 05/02/2013    Procedure: FULL UPPER ENDOSCOPIC ULTRASOUND (EUS) RADIAL;  Surgeon: Willis Modena, MD;  Location: [facility];  Service: Endoscopy;  Laterality: N/A;  . Breast surgery  2000    lt br cyst    Family History  Problem Relation Age of Onset  . Cancer Mother     Undetermined  . Heart attack Father   . Cancer Father     prostate  . Anuerysm Sister   . Obesity Brother    Social History:  reports that she quit smoking about 28 years ago. Her smoking use included Cigarettes. She started  smoking about 28 years ago. She has a 18 pack-year smoking history. She has never used smokeless tobacco. She reports that she drinks about 2.4 ounces of alcohol per week. She reports that she does not use illicit drugs.  Allergies: No Known Allergies  No prescriptions prior to admission    No results found for this or any previous visit (from the past 48 hour(s)). No results found.  Review of Systems  Constitutional: Negative.   HENT: Negative.   Eyes: Negative.   Respiratory: Negative.   Cardiovascular: Negative.   Gastrointestinal: Negative.   Genitourinary: Negative.   Musculoskeletal:       History of bone cancer left tibia with impending fracture.  Skin: Negative.   Neurological: Negative.   Endo/Heme/Allergies: Negative.   Psychiatric/Behavioral: Negative.     There were no vitals taken for this visit. Physical Exam  Constitutional: She is oriented to person, place, and time. She appears well-nourished.  HENT:  Head: Atraumatic.  Eyes: EOM are normal.  Neck: Neck supple.  Cardiovascular: Normal rate.   Respiratory: Effort normal.  GI: Soft.  Musculoskeletal:  Left leg is tender at the junction of the middle and distal tibia.  Calf is soft and nontender.  Her skin is intact and benign.  Good palpable pulses distally.  Hip motion good.  Straight leg raising negative.  No adenopathy noted.  Neurological: She  is oriented to person, place, and time.  Skin: Skin is dry.  Psychiatric: She has a normal mood and affect.     Assessment/Plan Assessment: Left tibia impending fracture from tumor. Plan: I think that she has an impending fracture if we did do not stabilize this soon.  We have discussed using a intramedullary stabilization technique.  we talked about the risk of infection, anesthesia, and DVT related to intramedullary stabilization.We have talked about her either going on the same day or even an overnight stay.  Dakari Stabler R 05/27/2013, 6:27 PM

## 2013-05-27 NOTE — Progress Notes (Signed)
Pt has metastatic liver ca-bones-pathological fx tibia-to bring all meds and overnight bag-take am baseline meds-arrive 11am with husband

## 2013-05-28 ENCOUNTER — Telehealth: Payer: Self-pay | Admitting: *Deleted

## 2013-05-28 ENCOUNTER — Encounter (HOSPITAL_BASED_OUTPATIENT_CLINIC_OR_DEPARTMENT_OTHER): Payer: Self-pay

## 2013-05-28 ENCOUNTER — Encounter (HOSPITAL_BASED_OUTPATIENT_CLINIC_OR_DEPARTMENT_OTHER): Payer: BC Managed Care – PPO | Admitting: Anesthesiology

## 2013-05-28 ENCOUNTER — Ambulatory Visit
Admission: RE | Admit: 2013-05-28 | Payer: BC Managed Care – PPO | Source: Ambulatory Visit | Admitting: Radiation Oncology

## 2013-05-28 ENCOUNTER — Ambulatory Visit (HOSPITAL_BASED_OUTPATIENT_CLINIC_OR_DEPARTMENT_OTHER): Payer: BC Managed Care – PPO | Admitting: Anesthesiology

## 2013-05-28 ENCOUNTER — Encounter (HOSPITAL_BASED_OUTPATIENT_CLINIC_OR_DEPARTMENT_OTHER)
Admission: RE | Disposition: A | Discharge status: Home / Self Care | Payer: Self-pay | Source: Ambulatory Visit | Attending: Orthopaedic Surgery

## 2013-05-28 ENCOUNTER — Ambulatory Visit (HOSPITAL_COMMUNITY): Payer: BC Managed Care – PPO

## 2013-05-28 ENCOUNTER — Ambulatory Visit (HOSPITAL_BASED_OUTPATIENT_CLINIC_OR_DEPARTMENT_OTHER)
Admission: RE | Admit: 2013-05-28 | Discharge: 2013-05-29 | Disposition: A | Discharge status: Home / Self Care | Payer: BC Managed Care – PPO | Source: Ambulatory Visit | Attending: Orthopaedic Surgery | Admitting: Orthopaedic Surgery

## 2013-05-28 DIAGNOSIS — K219 Gastro-esophageal reflux disease without esophagitis: Secondary | ICD-10-CM | POA: Insufficient documentation

## 2013-05-28 DIAGNOSIS — C7951 Secondary malignant neoplasm of bone: Secondary | ICD-10-CM | POA: Insufficient documentation

## 2013-05-28 DIAGNOSIS — M84469A Pathological fracture, unspecified tibia and fibula, initial encounter for fracture: Secondary | ICD-10-CM | POA: Insufficient documentation

## 2013-05-28 DIAGNOSIS — C249 Malignant neoplasm of biliary tract, unspecified: Secondary | ICD-10-CM | POA: Insufficient documentation

## 2013-05-28 DIAGNOSIS — Z87891 Personal history of nicotine dependence: Secondary | ICD-10-CM | POA: Insufficient documentation

## 2013-05-28 HISTORY — PX: TIBIA IM NAIL INSERTION: SHX2516

## 2013-05-28 HISTORY — DX: Encounter for fitting and adjustment of spectacles and contact lenses: Z46.0

## 2013-05-28 SURGERY — INSERTION, INTRAMEDULLARY ROD, TIBIA
Anesthesia: General | Site: Leg Lower | Laterality: Left | Wound class: Clean

## 2013-05-28 MED ORDER — CEFAZOLIN SODIUM 1-5 GM-% IV SOLN
1.0000 g | Freq: Four times a day (QID) | INTRAVENOUS | Status: AC
  Administered 2013-05-28: 1 g via INTRAVENOUS

## 2013-05-28 MED ORDER — METOCLOPRAMIDE HCL 5 MG/ML IJ SOLN: 5.0000 mg | Freq: Three times a day (TID) | INTRAMUSCULAR | Status: DC | PRN

## 2013-05-28 MED ORDER — DEXAMETHASONE SODIUM PHOSPHATE 4 MG/ML IJ SOLN
INTRAMUSCULAR | Status: DC | PRN
  Administered 2013-05-28: 5 mg via INTRAVENOUS

## 2013-05-28 MED ORDER — HYDROMORPHONE HCL PF 1 MG/ML IJ SOLN
0.5000 mg | INTRAMUSCULAR | Status: DC | PRN
  Administered 2013-05-28 (×2): 0.5 mg via INTRAVENOUS
  Administered 2013-05-29: 1 mg via INTRAVENOUS

## 2013-05-28 MED ORDER — FENTANYL CITRATE 0.05 MG/ML IJ SOLN
INTRAMUSCULAR | Status: AC
  Filled 2013-05-28: qty 6

## 2013-05-28 MED ORDER — EPHEDRINE SULFATE 50 MG/ML IJ SOLN
INTRAMUSCULAR | Status: DC | PRN
  Administered 2013-05-28 (×2): 10 mg via INTRAVENOUS

## 2013-05-28 MED ORDER — CEFAZOLIN SODIUM 1-5 GM-% IV SOLN
INTRAVENOUS | Status: AC
  Filled 2013-05-28: qty 50

## 2013-05-28 MED ORDER — FENTANYL CITRATE 0.05 MG/ML IJ SOLN
INTRAMUSCULAR | Status: AC
  Filled 2013-05-28: qty 4

## 2013-05-28 MED ORDER — PROPOFOL 10 MG/ML IV BOLUS
INTRAVENOUS | Status: DC | PRN
  Administered 2013-05-28: 110 mg via INTRAVENOUS

## 2013-05-28 MED ORDER — METOCLOPRAMIDE HCL 5 MG PO TABS: 5.0000 mg | ORAL_TABLET | Freq: Three times a day (TID) | ORAL | Status: DC | PRN

## 2013-05-28 MED ORDER — LIDOCAINE HCL (CARDIAC) 20 MG/ML IV SOLN
INTRAVENOUS | Status: DC | PRN
  Administered 2013-05-28: 40 mg via INTRAVENOUS

## 2013-05-28 MED ORDER — HYDROMORPHONE HCL PF 1 MG/ML IJ SOLN
INTRAMUSCULAR | Status: AC
  Filled 2013-05-28: qty 1

## 2013-05-28 MED ORDER — SENNOSIDES-DOCUSATE SODIUM 8.6-50 MG PO TABS: 1.0000 | ORAL_TABLET | Freq: Two times a day (BID) | ORAL | Status: DC | PRN

## 2013-05-28 MED ORDER — HYDROMORPHONE HCL PF 1 MG/ML IJ SOLN
0.2500 mg | INTRAMUSCULAR | Status: DC | PRN
  Administered 2013-05-28 (×3): 0.5 mg via INTRAVENOUS

## 2013-05-28 MED ORDER — LACTATED RINGERS IV SOLN
INTRAVENOUS | Status: DC
  Administered 2013-05-28 (×2): via INTRAVENOUS

## 2013-05-28 MED ORDER — MIDAZOLAM HCL 2 MG/2ML IJ SOLN: 1.0000 mg | INTRAMUSCULAR | Status: DC | PRN

## 2013-05-28 MED ORDER — MIDAZOLAM HCL 2 MG/2ML IJ SOLN
INTRAMUSCULAR | Status: AC
  Filled 2013-05-28: qty 2

## 2013-05-28 MED ORDER — BUPIVACAINE HCL (PF) 0.5 % IJ SOLN
INTRAMUSCULAR | Status: AC
  Filled 2013-05-28: qty 30

## 2013-05-28 MED ORDER — FENTANYL CITRATE 0.05 MG/ML IJ SOLN
INTRAMUSCULAR | Status: DC | PRN
  Administered 2013-05-28: 50 ug via INTRAVENOUS
  Administered 2013-05-28: 100 ug via INTRAVENOUS
  Administered 2013-05-28 (×3): 50 ug via INTRAVENOUS

## 2013-05-28 MED ORDER — ONDANSETRON HCL 4 MG/2ML IJ SOLN: 4.0000 mg | Freq: Four times a day (QID) | INTRAMUSCULAR | Status: DC | PRN

## 2013-05-28 MED ORDER — CEFAZOLIN SODIUM 1-5 GM-% IV SOLN
1.0000 g | Freq: Four times a day (QID) | INTRAVENOUS | Status: DC
  Administered 2013-05-28: 1 g via INTRAVENOUS

## 2013-05-28 MED ORDER — FENTANYL CITRATE 0.05 MG/ML IJ SOLN: 50.0000 ug | INTRAMUSCULAR | Status: DC | PRN

## 2013-05-28 MED ORDER — LACTATED RINGERS IV SOLN: INTRAVENOUS | Status: DC

## 2013-05-28 MED ORDER — MORPHINE SULFATE 15 MG PO TABS
15.0000 mg | ORAL_TABLET | ORAL | Status: DC | PRN
  Administered 2013-05-28 – 2013-05-29 (×3): 15 mg via ORAL

## 2013-05-28 MED ORDER — DEXTROSE IN LACTATED RINGERS 5 % IV SOLN
INTRAVENOUS | Status: DC
  Administered 2013-05-28: 75 mL/h via INTRAVENOUS

## 2013-05-28 MED ORDER — NON FORMULARY
20.0000 mg | Freq: Two times a day (BID) | Status: DC | PRN
  Administered 2013-05-28: 20 mg via ORAL

## 2013-05-28 MED ORDER — CEFAZOLIN SODIUM 1 G IJ SOLR
INTRAMUSCULAR | Status: AC
  Filled 2013-05-28: qty 10

## 2013-05-28 MED ORDER — BUPIVACAINE-EPINEPHRINE 0.5% -1:200000 IJ SOLN
INTRAMUSCULAR | Status: DC | PRN
  Administered 2013-05-28: 12 mL

## 2013-05-28 MED ORDER — BUPIVACAINE-EPINEPHRINE PF 0.5-1:200000 % IJ SOLN
INTRAMUSCULAR | Status: AC
  Filled 2013-05-28: qty 30

## 2013-05-28 MED ORDER — CHLORHEXIDINE GLUCONATE 4 % EX LIQD: 60.0000 mL | Freq: Once | CUTANEOUS | Status: DC

## 2013-05-28 MED ORDER — MORPHINE SULFATE ER 30 MG PO TBCR
30.0000 mg | EXTENDED_RELEASE_TABLET | Freq: Two times a day (BID) | ORAL | Status: AC
  Administered 2013-05-28: 30 mg via ORAL

## 2013-05-28 MED ORDER — ONDANSETRON HCL 4 MG/2ML IJ SOLN
INTRAMUSCULAR | Status: DC | PRN
  Administered 2013-05-28: 4 mg via INTRAVENOUS

## 2013-05-28 MED ORDER — OXYCODONE HCL 5 MG/5ML PO SOLN: 5.0000 mg | Freq: Once | ORAL | Status: DC | PRN

## 2013-05-28 MED ORDER — ONDANSETRON HCL 4 MG PO TABS: 4.0000 mg | ORAL_TABLET | Freq: Four times a day (QID) | ORAL | Status: DC | PRN

## 2013-05-28 MED ORDER — OXYCODONE HCL 5 MG PO TABS: 5.0000 mg | ORAL_TABLET | Freq: Once | ORAL | Status: DC | PRN

## 2013-05-28 MED ORDER — CEFAZOLIN SODIUM-DEXTROSE 2-3 GM-% IV SOLR
2.0000 g | INTRAVENOUS | Status: AC
  Administered 2013-05-28: 2 g via INTRAVENOUS

## 2013-05-28 SURGICAL SUPPLY — 57 items
BANDAGE ELASTIC 4 VELCRO ST LF (GAUZE/BANDAGES/DRESSINGS) ×2 IMPLANT
BANDAGE ELASTIC 6 VELCRO ST LF (GAUZE/BANDAGES/DRESSINGS) ×2 IMPLANT
BANDAGE GAUZE ELAST BULKY 4 IN (GAUZE/BANDAGES/DRESSINGS) ×2 IMPLANT
BIT DRILL 3.8X6 NS (BIT) ×2 IMPLANT
BIT DRILL 4.4 NS (BIT) ×2 IMPLANT
BLADE SURG 10 STRL SS (BLADE) IMPLANT
BLADE SURG 15 STRL LF DISP TIS (BLADE) ×1 IMPLANT
BLADE SURG 15 STRL SS (BLADE) ×1
CANISTER SUCT 1200ML W/VALVE (MISCELLANEOUS) ×2 IMPLANT
COVER TABLE BACK 60X90 (DRAPES) ×2 IMPLANT
CUFF TOURNIQUET SINGLE 24IN (TOURNIQUET CUFF) IMPLANT
CUFF TOURNIQUET SINGLE 34IN LL (TOURNIQUET CUFF) IMPLANT
DRAPE C-ARM 42X72 X-RAY (DRAPES) ×2 IMPLANT
DRAPE EXTREMITY T 121X128X90 (DRAPE) ×2 IMPLANT
DRAPE U-SHAPE 47X51 STRL (DRAPES) ×2 IMPLANT
DRSG EMULSION OIL 3X3 NADH (GAUZE/BANDAGES/DRESSINGS) ×4 IMPLANT
DRSG PAD ABDOMINAL 8X10 ST (GAUZE/BANDAGES/DRESSINGS) ×4 IMPLANT
DURAPREP 26ML APPLICATOR (WOUND CARE) ×2 IMPLANT
ELECT REM PT RETURN 9FT ADLT (ELECTROSURGICAL) ×2
ELECTRODE REM PT RTRN 9FT ADLT (ELECTROSURGICAL) ×1 IMPLANT
GLOVE BIO SURGEON STRL SZ8.5 (GLOVE) IMPLANT
GLOVE BIOGEL PI IND STRL 8 (GLOVE) ×1 IMPLANT
GLOVE BIOGEL PI IND STRL 8.5 (GLOVE) IMPLANT
GLOVE BIOGEL PI INDICATOR 8 (GLOVE) ×1
GLOVE BIOGEL PI INDICATOR 8.5 (GLOVE)
GLOVE SS BIOGEL STRL SZ 8 (GLOVE) ×1 IMPLANT
GLOVE SUPERSENSE BIOGEL SZ 8 (GLOVE) ×1
GOWN PREVENTION PLUS XLARGE (GOWN DISPOSABLE) ×2 IMPLANT
GOWN PREVENTION PLUS XXLARGE (GOWN DISPOSABLE) ×4 IMPLANT
GUIDEWIRE BALL NOSE 80CM (WIRE) ×2 IMPLANT
NAIL TIBIAL 11MMX33CM (Nail) ×2 IMPLANT
NS IRRIG 1000ML POUR BTL (IV SOLUTION) ×2 IMPLANT
PACK ARTHROSCOPY DSU (CUSTOM PROCEDURE TRAY) ×2 IMPLANT
PACK BASIN DAY SURGERY FS (CUSTOM PROCEDURE TRAY) ×2 IMPLANT
PENCIL BUTTON HOLSTER BLD 10FT (ELECTRODE) ×2 IMPLANT
PIN GUIDE ACE (PIN) ×2 IMPLANT
SCREW ACECAP 32MM (Screw) ×2 IMPLANT
SCREW ACECAP 38MM (Screw) ×2 IMPLANT
SCREW PROXIMAL DEPUY (Screw) ×3 IMPLANT
SCREW PRXML FT 55X5.5XNS TIB (Screw) ×2 IMPLANT
SCREW PRXML FT 65X5.5XNS CORT (Screw) ×1 IMPLANT
SHEET MEDIUM DRAPE 40X70 STRL (DRAPES) ×2 IMPLANT
SLEEVE SCD COMPRESS KNEE MED (MISCELLANEOUS) ×2 IMPLANT
SPONGE GAUZE 4X4 12PLY (GAUZE/BANDAGES/DRESSINGS) ×2 IMPLANT
SPONGE LAP 18X18 X RAY DECT (DISPOSABLE) ×2 IMPLANT
STAPLER VISISTAT 35W (STAPLE) IMPLANT
SUCTION FRAZIER TIP 10 FR DISP (SUCTIONS) ×2 IMPLANT
SUT ETHILON 3 0 PS 1 (SUTURE) ×2 IMPLANT
SUT MNCRL AB 4-0 PS2 18 (SUTURE) ×2 IMPLANT
SUT VIC AB 0 CT1 27 (SUTURE)
SUT VIC AB 0 CT1 27XBRD ANBCTR (SUTURE) IMPLANT
SUT VIC AB 2-0 CT1 27 (SUTURE)
SUT VIC AB 2-0 CT1 TAPERPNT 27 (SUTURE) IMPLANT
SUT VIC AB 2-0 SH 27 (SUTURE) ×1
SUT VIC AB 2-0 SH 27XBRD (SUTURE) ×1 IMPLANT
SYR BULB 3OZ (MISCELLANEOUS) ×2 IMPLANT
TOWEL OR NON WOVEN STRL DISP B (DISPOSABLE) ×2 IMPLANT

## 2013-05-28 NOTE — Op Note (Signed)
#  [id number] 

## 2013-05-28 NOTE — Anesthesia Preprocedure Evaluation (Addendum)
Anesthesia Evaluation  Patient identified by MRN, date of birth, ID band Patient awake    Reviewed: Allergy & Precautions, H&P , NPO status , Patient's Chart, lab work & pertinent test results  Airway Mallampati: I TM Distance: >3 FB Neck ROM: Full    Dental no notable dental hx. (+) Teeth Intact and Dental Advisory Given   Pulmonary former smoker,  breath sounds clear to auscultation  Pulmonary exam normal       Cardiovascular negative cardio ROS  Rhythm:Regular Rate:Normal     Neuro/Psych negative neurological ROS  negative psych ROS   GI/Hepatic GERD-  Medicated and Controlled,Cholangiocarcinoma with bone mets   Endo/Other  negative endocrine ROS  Renal/GU negative Renal ROS  negative genitourinary   Musculoskeletal   Abdominal   Peds  Hematology negative hematology ROS (+)   Anesthesia Other Findings   Reproductive/Obstetrics negative OB ROS                          Anesthesia Physical Anesthesia Plan  ASA: III  Anesthesia Plan: General   Post-op Pain Management:    Induction: Intravenous  Airway Management Planned: Oral ETT  Additional Equipment:   Intra-op Plan:   Post-operative Plan: Extubation in OR  Informed Consent: I have reviewed the patients History and Physical, chart, labs and discussed the procedure including the risks, benefits and alternatives for the proposed anesthesia with the patient or authorized representative who has indicated his/her understanding and acceptance.   Dental advisory given  Plan Discussed with: CRNA  Anesthesia Plan Comments:        Anesthesia Quick Evaluation

## 2013-05-28 NOTE — Anesthesia Procedure Notes (Signed)
Procedure Name: LMA Insertion Date/Time: 05/28/2013 1:20 PM Performed by: Burna Cash Pre-anesthesia Checklist: Patient identified, Emergency Drugs available, Suction available and Patient being monitored Patient Re-evaluated:Patient Re-evaluated prior to inductionOxygen Delivery Method: Circle System Utilized Preoxygenation: Pre-oxygenation with 100% oxygen Intubation Type: IV induction Ventilation: Mask ventilation without difficulty LMA: LMA inserted LMA Size: 4.0 Number of attempts: 1 Airway Equipment and Method: bite block Placement Confirmation: positive ETCO2 Tube secured with: Tape Dental Injury: Teeth and Oropharynx as per pre-operative assessment

## 2013-05-28 NOTE — Progress Notes (Signed)
  Radiation Oncology         [phone]) [phone] ________________________________  Name: Bianca Logan MRN: [id number]  Date: 05/21/2013  DOB: Nov 12, 1950  End of Treatment Note  Diagnosis:   Metastatic cholangiocarcinoma with rapidly progressive disease and pain     Indication for treatment:  Palliative       Radiation treatment dates:   05/21/2013  Site/dose:   T6-T10 and L2-L4 received 8 Gy in a single fraction in order to balance pain control and her need for chemotherapy given the rapid progression of her disease. The Lumbar spine fields were treated AP/PA using 6 and 15 MV photons while the T spine field was treated using a PA field with 15 MV photons..   Narrative: The patient tolerated radiation treatment relatively well.   She was able to proceed on immediately to chemotherapy.   Plan: The patient has completed radiation treatment. The patient will return to radiation oncology clinic for routine followup in one month. I advised them to call or return sooner if they have any questions or concerns related to their recovery or treatment.  ------------------------------------------------  Lurline Hare, MD

## 2013-05-28 NOTE — Transfer of Care (Signed)
Immediate Anesthesia Transfer of Care Note  Patient: Bianca Logan  Procedure(s) Performed: Procedure(s): INTRAMEDULLARY (IM) NAIL LEFT TIBIAL (Left)  Patient Location: PACU  Anesthesia Type:General  Level of Consciousness: awake, alert  and oriented  Airway & Oxygen Therapy: Patient Spontanous Breathing and Patient connected to face mask oxygen  Post-op Assessment: Report given to PACU RN and Post -op Vital signs reviewed and stable  Post vital signs: Reviewed and stable  Complications: No apparent anesthesia complications

## 2013-05-28 NOTE — Telephone Encounter (Signed)
Call from pt reporting numbness in R leg. Extends from hip to knee. Painful to move leg. Wanted to make Dr. Truett Perna aware. Per Dr. Truett Perna: Likely related to L2 lesion. Steroids should help. Returned call to pt, she has taken her steroid, pain seems to be subsiding.

## 2013-05-28 NOTE — Telephone Encounter (Signed)
Late entry for 05/27/13: Left message for Inetta Fermo in IR requesting to reschedule port placement 1-2 weeks out. Pt will be admitted overnight 11/18 for orthopedic surgery.

## 2013-05-28 NOTE — Interval H&P Note (Signed)
History and Physical Interval Note:  05/28/2013 11:14 AM  Bianca Logan  has presented today for surgery, with the diagnosis of left tibia pathologic fracture  The various methods of treatment have been discussed with the patient and family. After consideration of risks, benefits and other options for treatment, the patient has consented to  Procedure(s): INTRAMEDULLARY (IM) NAIL LEFT TIBIAL (Left) as a surgical intervention .  The patient's history has been reviewed, patient examined, no change in status, stable for surgery.  I have reviewed the patient's chart and labs.  Questions were answered to the patient's satisfaction.     Amere Iott G

## 2013-05-28 NOTE — Anesthesia Postprocedure Evaluation (Signed)
  Anesthesia Post-op Note  Patient: Bianca Logan  Procedure(s) Performed: Procedure(s): INTRAMEDULLARY (IM) NAIL LEFT TIBIAL (Left)  Patient Location: PACU  Anesthesia Type:General  Level of Consciousness: awake and alert   Airway and Oxygen Therapy: Patient Spontanous Breathing  Post-op Pain: moderate  Post-op Assessment: Post-op Vital signs reviewed, Patient's Cardiovascular Status Stable and Respiratory Function Stable  Post-op Vital Signs: Reviewed  Filed Vitals:   05/28/13 1600  BP: 126/66  Pulse: 101  Temp:   Resp: 21    Complications: No apparent anesthesia complications

## 2013-05-29 ENCOUNTER — Ambulatory Visit (HOSPITAL_COMMUNITY): Admission: RE | Admit: 2013-05-29 | Payer: BC Managed Care – PPO | Source: Ambulatory Visit

## 2013-05-29 ENCOUNTER — Telehealth: Payer: Self-pay | Admitting: *Deleted

## 2013-05-29 MED ORDER — CEFAZOLIN SODIUM 1-5 GM-% IV SOLN
INTRAVENOUS | Status: AC
  Filled 2013-05-29: qty 50

## 2013-05-29 MED ORDER — DEXAMETHASONE 4 MG PO TABS: 4.0000 mg | ORAL_TABLET | Freq: Two times a day (BID) | ORAL | Status: DC

## 2013-05-29 MED ORDER — HYDROMORPHONE HCL PF 1 MG/ML IJ SOLN
INTRAMUSCULAR | Status: AC
  Filled 2013-05-29: qty 1

## 2013-05-29 MED ORDER — MORPHINE SULFATE 15 MG PO TABS: 15.0000 mg | ORAL_TABLET | ORAL | Status: DC | PRN

## 2013-05-29 NOTE — Telephone Encounter (Signed)
Call from pt reporting back and R side pain is worsening and pain meds are not helping. Currently taking MS IR 15 mg Q 4 hours PRN. Decadron 4 mg daily. Reviewed with Dr. Truett Perna: Order received to increase Decadron to twice daily. May take MS IR 15-30 mg Q 4 hours PRN. Called pt with these instructions, she voiced understanding. Instructed her to be diligent with bowel regimen while taking pain meds. She voiced understanding. Will need Morphine Rx due to increased dose. Will leave in prescription book for pick up.

## 2013-05-31 NOTE — Op Note (Signed)
Bianca Logan, MOLTER NO.:  [id number]  MEDICAL RECORD NO.:  [id number]  LOCATION:                               FACILITY:  [facility]  PHYSICIAN:  Lubertha Basque. Jmya Uliano, M.D.DATE OF BIRTH:  [birth year]  DATE OF PROCEDURE:  05/28/2013 DATE OF DISCHARGE:  05/29/2013                              OPERATIVE REPORT   PREOPERATIVE DIAGNOSIS:  Left tibia pathologic fracture.  POSTOPERATIVE DIAGNOSIS:  Left tibia pathologic fracture.  PROCEDURE:  Left tibia intramedullary nail.  ANESTHESIA:  General.  ATTENDING SURGEON:  Lubertha Basque. Jerl Santos, M.D.  ASSISTANT:  Elodia Florence, PA-C  INDICATION FOR PROCEDURE:  The patient is a 62 year old woman with a recently diagnosed cholangiocarcinoma who has an impending fracture about her left tibia.  She has pain which makes it difficult for her to walk.  By x-ray, she has near complete erosion of the tibial cortex posteromedial and she is offered intramedullary fixation in an hope of avoiding a fracture.  Informed operative consent was obtained after discussion of possible complications including reaction to anesthesia, infection, DVT, and fracture.  SUMMARY OF FINDINGS AND PROCEDURE:  Under general anesthesia, a left tibia IM nail was placed.  This was an 11 x 33 versa nail by [employer]. This was locked both proximally and distally with 2 screws at each location.  She was admitted for overnight observation with probable discharge home in the morning.  We used fluoroscopy throughout the case to make appropriate intraoperative decisions and I read all of these views myself.  DESCRIPTION OF PROCEDURE:  The patient was taken to the operating suite where general anesthetic was applied without difficulty.  She was positioned supine and prepped and draped in normal sterile fashion. After the administration with IV Kefzol and an appropriate time-out, a small incision was made just medial to the patellar tendon proximally. Dissection was carried  down to the flat surface from the anterior aspect of the tibia.  I placed a guidewire and then over reamed.  I then placed a guidewire down to the ankle, seen to be central on 2 views fluoroscopically.  Over this, I reamed to a diameter of 12 mm; followed by placement of a size 11 x 33 tibial nail.  We then locked this proximally with the attached locking guide and then distally in a freehand technique through 2 small incisions.  Fluoroscopy views confirmed adequate placement of hardware.  The wounds were then irrigated, followed by reapproximation of deep tissues with Vicryl and skin with nylon, and an absorbable stitch proximally.  Adaptic was applied.  Some Marcaine was injected about the incision sites as well. I then placed dry gauze and a posterior splint of plaster with ankle in neutral position.  Estimated blood loss and fluids obtained from the anesthesia records.  No tourniquet was used.  DISPOSITION:  The patient was extubated in the operating room and taken to recovery room in stable condition.  She was to be admitted overnight for observation with probable discharge home in the morning.     Lubertha Basque Jerl Santos, M.D.   ______________________________ Lubertha Basque. Jerl Santos, M.D.    PGD/MEDQ  D:  05/28/2013  T:  05/29/2013  Job:  [id number] 

## 2013-06-02 ENCOUNTER — Other Ambulatory Visit: Payer: Self-pay | Admitting: Oncology

## 2013-06-04 ENCOUNTER — Encounter (HOSPITAL_BASED_OUTPATIENT_CLINIC_OR_DEPARTMENT_OTHER): Payer: Self-pay | Admitting: Orthopaedic Surgery

## 2013-06-04 ENCOUNTER — Ambulatory Visit
Admission: RE | Admit: 2013-06-04 | Discharge: 2013-06-04 | Disposition: A | Discharge status: Home / Self Care | Payer: BC Managed Care – PPO | Source: Ambulatory Visit | Attending: Radiation Oncology | Admitting: Radiation Oncology

## 2013-06-04 DIAGNOSIS — C801 Malignant (primary) neoplasm, unspecified: Secondary | ICD-10-CM | POA: Insufficient documentation

## 2013-06-04 DIAGNOSIS — C419 Malignant neoplasm of bone and articular cartilage, unspecified: Secondary | ICD-10-CM

## 2013-06-04 DIAGNOSIS — C221 Intrahepatic bile duct carcinoma: Secondary | ICD-10-CM | POA: Insufficient documentation

## 2013-06-04 DIAGNOSIS — Z510 Encounter for antineoplastic radiation therapy: Secondary | ICD-10-CM | POA: Insufficient documentation

## 2013-06-04 NOTE — Progress Notes (Signed)
Name: Bianca Logan   MRN: [id number]  Date:  06/04/2013  DOB: March 21, 1951  Status:outpatient    DIAGNOSIS: Metastatic cholangiocarcinoma  CONSENT VERIFIED: yes   SET UP: Patient is setup supine   IMMOBILIZATION:  The following immobilization was used: Vacloc  NARRATIVE:  [name] was brought to the CT Simulation planning suite.  Identity was confirmed.  All relevant records and images related to the planned course of therapy were reviewed.  Then, the patient was positioned in a stable reproducible clinical set-up for radiation therapy with her feet first.  CT images were obtained.  An isocenter was placed. Skin markings were placed.  The CT images were loaded into the planning software where the target and avoidance structures were contoured.  The radiation prescription was entered and confirmed. The patient was discharged in stable condition and tolerated simulation well.    TREATMENT PLANNING NOTE:  Treatment planning then occurred. I have requested : MLC's, isodose plan, basic dose calculation  A total of 2 medically necessary complex treatment devices were formed today. I directly supervised and approved their construction.

## 2013-06-05 ENCOUNTER — Other Ambulatory Visit (HOSPITAL_BASED_OUTPATIENT_CLINIC_OR_DEPARTMENT_OTHER): Payer: BC Managed Care – PPO | Admitting: Lab

## 2013-06-05 ENCOUNTER — Ambulatory Visit (HOSPITAL_BASED_OUTPATIENT_CLINIC_OR_DEPARTMENT_OTHER): Payer: BC Managed Care – PPO | Admitting: Oncology

## 2013-06-05 ENCOUNTER — Ambulatory Visit (HOSPITAL_BASED_OUTPATIENT_CLINIC_OR_DEPARTMENT_OTHER): Payer: BC Managed Care – PPO

## 2013-06-05 ENCOUNTER — Telehealth: Payer: Self-pay | Admitting: Oncology

## 2013-06-05 ENCOUNTER — Other Ambulatory Visit (HOSPITAL_COMMUNITY): Payer: Self-pay | Admitting: Radiology

## 2013-06-05 ENCOUNTER — Ambulatory Visit: Payer: BC Managed Care – PPO | Admitting: Nutrition

## 2013-06-05 VITALS — BP 104/70 | HR 107 | Temp 97.8°F | Resp 20 | Ht 64.0 in | Wt 128.2 lb

## 2013-06-05 DIAGNOSIS — C221 Intrahepatic bile duct carcinoma: Secondary | ICD-10-CM

## 2013-06-05 DIAGNOSIS — Z5111 Encounter for antineoplastic chemotherapy: Secondary | ICD-10-CM

## 2013-06-05 DIAGNOSIS — C787 Secondary malignant neoplasm of liver and intrahepatic bile duct: Secondary | ICD-10-CM

## 2013-06-05 DIAGNOSIS — C7951 Secondary malignant neoplasm of bone: Secondary | ICD-10-CM

## 2013-06-05 DIAGNOSIS — C786 Secondary malignant neoplasm of retroperitoneum and peritoneum: Secondary | ICD-10-CM

## 2013-06-05 LAB — COMPREHENSIVE METABOLIC PANEL (CC13)
ALT: 79 U/L — ABNORMAL HIGH (ref 0–55)
Albumin: 2.3 g/dL — ABNORMAL LOW (ref 3.5–5.0)
Anion Gap: 10 mEq/L (ref 3–11)
CO2: 26 mEq/L (ref 22–29)
Calcium: 8.9 mg/dL (ref 8.4–10.4)
Chloride: 97 mEq/L — ABNORMAL LOW (ref 98–109)
Potassium: 4.6 mEq/L (ref 3.5–5.1)
Sodium: 133 mEq/L — ABNORMAL LOW (ref 136–145)
Total Protein: 6.1 g/dL — ABNORMAL LOW (ref 6.4–8.3)

## 2013-06-05 LAB — CBC WITH DIFFERENTIAL/PLATELET
BASO%: 0.6 % (ref 0.0–2.0)
Eosinophils Absolute: 0.2 10*3/uL (ref 0.0–0.5)
HGB: 12 g/dL (ref 11.6–15.9)
LYMPH%: 16.9 % (ref 14.0–49.7)
MCV: 82.6 fL (ref 79.5–101.0)
MONO#: 1.4 10*3/uL — ABNORMAL HIGH (ref 0.1–0.9)
NEUT#: 7.7 10*3/uL — ABNORMAL HIGH (ref 1.5–6.5)
Platelets: 522 10*3/uL — ABNORMAL HIGH (ref 145–400)
RBC: 4.3 10*6/uL (ref 3.70–5.45)
WBC: 11.2 10*3/uL — ABNORMAL HIGH (ref 3.9–10.3)
nRBC: 0 % (ref 0–0)

## 2013-06-05 MED ORDER — SODIUM CHLORIDE 0.9 % IV SOLN
800.0000 mg/m2 | Freq: Once | INTRAVENOUS | Status: AC
  Administered 2013-06-05: 1330 mg via INTRAVENOUS
  Filled 2013-06-05: qty 34.98

## 2013-06-05 MED ORDER — OXALIPLATIN CHEMO INJECTION 100 MG/20ML
80.0000 mg/m2 | Freq: Once | INTRAVENOUS | Status: AC
  Administered 2013-06-05: 135 mg via INTRAVENOUS
  Filled 2013-06-05: qty 27

## 2013-06-05 MED ORDER — SODIUM CHLORIDE 0.9 % IV SOLN
Freq: Once | INTRAVENOUS | Status: AC
  Administered 2013-06-05: 11:00:00 via INTRAVENOUS

## 2013-06-05 MED ORDER — DEXAMETHASONE SODIUM PHOSPHATE 10 MG/ML IJ SOLN
INTRAMUSCULAR | Status: AC
  Filled 2013-06-05: qty 1

## 2013-06-05 MED ORDER — DEXAMETHASONE SODIUM PHOSPHATE 10 MG/ML IJ SOLN
10.0000 mg | Freq: Once | INTRAMUSCULAR | Status: AC
  Administered 2013-06-05: 10 mg via INTRAVENOUS

## 2013-06-05 MED ORDER — ONDANSETRON 8 MG/NS 50 ML IVPB
INTRAVENOUS | Status: AC
  Filled 2013-06-05: qty 8

## 2013-06-05 MED ORDER — DEXTROSE 5 % IV SOLN
Freq: Once | INTRAVENOUS | Status: AC
  Administered 2013-06-05: 12:00:00 via INTRAVENOUS

## 2013-06-05 MED ORDER — ONDANSETRON 8 MG/50ML IVPB (CHCC)
8.0000 mg | Freq: Once | INTRAVENOUS | Status: AC
  Administered 2013-06-05: 8 mg via INTRAVENOUS

## 2013-06-05 NOTE — Patient Instructions (Signed)
[facility] Discharge Instructions for Patients Receiving Chemotherapy  Today you received the following chemotherapy agents Oxaliplatin and Gemzar.  To help prevent nausea and vomiting after your treatment, we encourage you to take your nausea medication as prescribed.   If you develop nausea and vomiting that is not controlled by your nausea medication, call the clinic.   BELOW ARE SYMPTOMS THAT SHOULD BE REPORTED IMMEDIATELY:  *FEVER GREATER THAN 100.5 F  *CHILLS WITH OR WITHOUT FEVER  NAUSEA AND VOMITING THAT IS NOT CONTROLLED WITH YOUR NAUSEA MEDICATION  *UNUSUAL SHORTNESS OF BREATH  *UNUSUAL BRUISING OR BLEEDING  TENDERNESS IN MOUTH AND THROAT WITH OR WITHOUT PRESENCE OF ULCERS  *URINARY PROBLEMS  *BOWEL PROBLEMS  UNUSUAL RASH Items with * indicate a potential emergency and should be followed up as soon as possible.  Feel free to call the clinic you have any questions or concerns. The clinic phone number is [phone].

## 2013-06-05 NOTE — Progress Notes (Signed)
Patient receiving chemotherapy today.  She is status post left tibia intramedullary nail secondary to pathological fracture.  Her weight has decreased to 128.2 pounds documented November 26.  This is down from 135.9 pounds November 7.  This weight does reflect a boot on her foot.  Patient has definitely had decreased oral intake over the last several weeks.  She denies nutritional problems eating at this time.  She is tolerating Orgain oral nutrition supplements.  Nutrition diagnosis: Food and nutrition related knowledge deficit improved.  Intervention: Patient was provided with support and encouragement to continue small frequent snacks and meals throughout the day.  I have recommended patient drink wto Orgain supplements daily.  Teach back method used.  Monitoring, evaluation, goals: Patient will tolerate adequate calories and protein to minimize further weight loss.  Next visit: Wednesday, December 10, during chemotherapy.

## 2013-06-05 NOTE — Telephone Encounter (Signed)
gv an printed appt sched and avs for pt for DEc...sed added tx.

## 2013-06-05 NOTE — Progress Notes (Signed)
[facility]    OFFICE PROGRESS NOTE   INTERVAL HISTORY:   She underwent a left tibia intramedullary nail by Dr. Jerl Santos on11/18/2014. She reports recovering uneventfully from the procedure. She is scheduled to complete radiation to the left tibia next week.  She continues to have pain in the right "hip "area. She reports stiffness in the hip.the pain is relieved with MSIR.she is ambulating with a walker.  She tolerated the first cycle of gemcitabine/oxaliplatin without significant toxicity.  Objective:  Vital signs in last 24 hours:  Blood pressure 104/70, pulse 107, temperature 97.8 F (36.6 C), temperature source Oral, resp. rate 20, height 5\' 4"  (1.626 m), weight 128 lb 3.2 oz (58.151 kg).    HEENT: sclera anicteric, oropharynx without thrush or ulcers Resp: lungs clear bilaterally Cardio: regular rate and rhythm GI: nontender, no hepatomegaly, gauze dressing at the upper abdomen drain site Vascular: no leg edema, sutures in place at the left tibia. Neuro:alert and oriented      Lab Results:  Lab Results  Component Value Date   WBC 11.2* 06/05/2013   HGB 12.0 06/05/2013   HCT 35.5 06/05/2013   MCV 82.6 06/05/2013   PLT 522* 06/05/2013  ANC 7.7    Medications: I have reviewed the patient's current medications.  Assessment/Plan: 1. 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 Staging chest CT 05/15/2013 showed a 1.3 cm soft tissue mass within the medial aspect of the right breast; 1.9 cm left supraclavicular lymph node; multiple enlarged mediastinal lymph nodes; 3 mm left upper lobe pulmonary nodule. Repeat CT abdomen and pelvis 05/15/2013 showed interval increase in the size of previously visualized lesions throughout the liver and multiple additional new lesions; bulky retroperitoneal lymphadenopathy  was stable; extensive porta hepatis lymphadenopathy was redemonstrated; 7 mm soft tissue nodule in the subcutaneous fat of the anterior abdominal wall; 2.5 cm calcified lesion within the left hemipelvis; interval development of a pathologic compression fracture of the L3 vertebral body with extension of soft tissue components into the canal which was narrowed approximately 50%; metastatic involvement of the T9 vertebral body with small amount of extension into the vertebral canal; small amount of abnormal enhancing soft tissue at T7 with mild narrowing of the canal; destructive 2.6 cm lesion involving the lateral aspect of the left third rib. Palliative radiation to T9 and L3 on 05/21/2013  Cycle 1 gemcitabine/oxaliplatin on 05/22/2013 2. Obstructive jaundice secondary to #1 status post placement of a percutaneous biliary drain on 05/02/2013. Cholangiogram 05/16/2013 showed the existing left internal/external biliary drain was well positioned. The biliary system was decompressed. The internal/external biliary drain was exchanged. The jaundice has improved. 3. Pain secondary to bone metastases involving the lumbar spine, retroperitoneal adenopathy. The pain improved with initiation of dexamethasone. She is taking MS Contin with MSIR as needed. 4. Anorexia/weight loss secondary to #1. 5. Subcutaneous lesion right scalp. Question lymph node.       6.   Pain/tenderness at the left lower tibia-lytic lesion confirmed on a plain x-ray 05/24/2013, status post an intramedullary nail on 05/28/2013     Disposition:  She appears stable. She tolerated the left tibia surgery well. The plan is to proceed with cycle 2 of gemcitabine/oxaliplatin today. She is scheduled for radiation to the left tibia on 06/12/2013. She will undergo placement of a Port-A-Cath on 06/14/2013.  She will decrease the Decadron to once daily. She will discuss the  right "hip "discomfort with Dr. Jerl Santos. A plain x-ray of the right hip on  05/24/2013 revealed no hip fracture or lesion at the hip. A subtle lucency was noted at the inferior left pubic ramus.   Thornton Papas, MD  06/05/2013  6:17 PM

## 2013-06-05 NOTE — Progress Notes (Signed)
Reports pain 5-6/10 with MS Contin 30 mg bid and taking MSIR 30 mg in am; in afternoon; and 15 mg at night. Her pain goal is 0/10, but is afraid of becoming addicted to narcotics. Stressed to her that this is not an issue for her. She is taking medication for pain.

## 2013-06-07 ENCOUNTER — Telehealth: Payer: Self-pay | Admitting: *Deleted

## 2013-06-07 MED ORDER — MORPHINE SULFATE 15 MG PO TABS: 15.0000 mg | ORAL_TABLET | ORAL | Status: DC | PRN

## 2013-06-07 NOTE — Telephone Encounter (Signed)
Left VM to pick up script.

## 2013-06-07 NOTE — Telephone Encounter (Signed)
Needs refill on MSIR

## 2013-06-10 ENCOUNTER — Encounter (HOSPITAL_COMMUNITY): Payer: Self-pay | Admitting: Pharmacy Technician

## 2013-06-11 ENCOUNTER — Other Ambulatory Visit: Payer: Self-pay | Admitting: *Deleted

## 2013-06-11 MED ORDER — MORPHINE SULFATE ER 30 MG PO TBCR: 30.0000 mg | EXTENDED_RELEASE_TABLET | Freq: Two times a day (BID) | ORAL | Status: DC

## 2013-06-11 NOTE — Telephone Encounter (Signed)
Call from pt requesting refill on MS Contin. Rx left in prescription book for pick up. Pt reports she did well after chemo. Noticed more fatigue this round, but it's subsiding.

## 2013-06-12 ENCOUNTER — Encounter: Payer: Self-pay | Admitting: Radiation Oncology

## 2013-06-12 ENCOUNTER — Ambulatory Visit
Admission: RE | Admit: 2013-06-12 | Discharge: 2013-06-12 | Disposition: A | Discharge status: Home / Self Care | Payer: BC Managed Care – PPO | Source: Ambulatory Visit | Attending: Radiation Oncology | Admitting: Radiation Oncology

## 2013-06-12 DIAGNOSIS — C419 Malignant neoplasm of bone and articular cartilage, unspecified: Secondary | ICD-10-CM

## 2013-06-13 ENCOUNTER — Telehealth: Payer: Self-pay | Admitting: *Deleted

## 2013-06-13 NOTE — Telephone Encounter (Signed)
Received phone call from patient asking of it is okay to receive the PT that Dr. Jerl Santos has ordered.  She wanted to make sure Dr.Sherrill is aware and okay with it.  Per Dr. Truett Perna," it is okay as long as she feels up to it.  If PT has questions, please have them call".  Patient expressed appreciation and understanding.

## 2013-06-13 NOTE — Progress Notes (Signed)
Weekly Management Note Current Dose:  8 Gy  Projected Dose: 8 Gy   Narrative:  The patient presents for routine under treatment assessment.  CBCT/MVCT images/Port film x-rays were reviewed.  The chart was checked.doing well. Wanted to cancel f/u for next week and reschedule for week of christmas. Still having pain on right side. Taking mscontin and prn 6-8 tabs throughout the day.   Physical Findings: Weight:  . Unchanged. In a wheelchair  Impression:  The patient tolerated her single fraction well.  Plan:  Follow up in 2 weeks. Discuss increasing mscontin with med onc.

## 2013-06-13 NOTE — Progress Notes (Signed)
  Radiation Oncology         [phone]) [phone] ________________________________  Name: Bianca Logan MRN: [id number]  Date: 06/12/2013  DOB: [birth year]  Simulation Verification Note  Status: outpatient  NARRATIVE: The patient was brought to the treatment unit and placed in the planned treatment position. The clinical setup was verified. Then port films were obtained and uploaded to the radiation oncology medical record software.  The treatment beams were carefully compared against the planned radiation fields. The position location and shape of the radiation fields was reviewed. The targeted volume of tissue appears appropriately covered by the radiation beams. Organs at risk appear to be excluded as planned.  Based on my personal review, I approved the simulation verification. The patient's treatment will proceed as planned.  ------------------------------------------------  Lurline Hare, MD

## 2013-06-14 ENCOUNTER — Ambulatory Visit (HOSPITAL_COMMUNITY)
Admission: RE | Admit: 2013-06-14 | Discharge: 2013-06-14 | Disposition: A | Discharge status: Home / Self Care | Payer: BC Managed Care – PPO | Source: Ambulatory Visit | Attending: Oncology | Admitting: Oncology

## 2013-06-14 ENCOUNTER — Encounter (HOSPITAL_COMMUNITY): Payer: Self-pay

## 2013-06-14 ENCOUNTER — Other Ambulatory Visit: Payer: Self-pay | Admitting: Oncology

## 2013-06-14 ENCOUNTER — Other Ambulatory Visit (HOSPITAL_COMMUNITY): Payer: BC Managed Care – PPO

## 2013-06-14 DIAGNOSIS — C221 Intrahepatic bile duct carcinoma: Secondary | ICD-10-CM | POA: Diagnosis present

## 2013-06-14 DIAGNOSIS — Z87891 Personal history of nicotine dependence: Secondary | ICD-10-CM | POA: Insufficient documentation

## 2013-06-14 DIAGNOSIS — C7951 Secondary malignant neoplasm of bone: Secondary | ICD-10-CM | POA: Insufficient documentation

## 2013-06-14 DIAGNOSIS — K219 Gastro-esophageal reflux disease without esophagitis: Secondary | ICD-10-CM | POA: Diagnosis not present

## 2013-06-14 DIAGNOSIS — Z79899 Other long term (current) drug therapy: Secondary | ICD-10-CM | POA: Diagnosis not present

## 2013-06-14 LAB — APTT: aPTT: 26 seconds (ref 24–37)

## 2013-06-14 LAB — BASIC METABOLIC PANEL
BUN: 8 mg/dL (ref 6–23)
CO2: 25 mEq/L (ref 19–32)
Chloride: 96 mEq/L (ref 96–112)
Creatinine, Ser: 0.41 mg/dL — ABNORMAL LOW (ref 0.50–1.10)
GFR calc Af Amer: >90 mL/min (ref >90)
Potassium: 3.1 mEq/L — ABNORMAL LOW (ref 3.5–5.1)

## 2013-06-14 LAB — CBC
HCT: 31.2 % — ABNORMAL LOW (ref 36.0–46.0)
MCHC: 34.9 g/dL (ref 30.0–36.0)
MCV: 80.6 fL (ref 78.0–100.0)
RDW: 15.4 % (ref 11.5–15.5)
WBC: 10.3 10*3/uL (ref 4.0–10.5)

## 2013-06-14 MED ORDER — FENTANYL CITRATE 0.05 MG/ML IJ SOLN
INTRAMUSCULAR | Status: AC | PRN
  Administered 2013-06-14: 100 ug via INTRAVENOUS

## 2013-06-14 MED ORDER — MIDAZOLAM HCL 2 MG/2ML IJ SOLN
INTRAMUSCULAR | Status: AC
  Filled 2013-06-14: qty 4

## 2013-06-14 MED ORDER — CEFAZOLIN SODIUM-DEXTROSE 2-3 GM-% IV SOLR
2.0000 g | Freq: Once | INTRAVENOUS | Status: AC
  Administered 2013-06-14: 2 g via INTRAVENOUS
  Filled 2013-06-14: qty 50

## 2013-06-14 MED ORDER — FENTANYL CITRATE 0.05 MG/ML IJ SOLN
INTRAMUSCULAR | Status: AC
  Filled 2013-06-14: qty 4

## 2013-06-14 MED ORDER — SODIUM CHLORIDE 0.9 % IV SOLN
Freq: Once | INTRAVENOUS | Status: AC
  Administered 2013-06-14: 12:00:00 via INTRAVENOUS

## 2013-06-14 MED ORDER — HEPARIN SOD (PORK) LOCK FLUSH 100 UNIT/ML IV SOLN
500.0000 [IU] | Freq: Once | INTRAVENOUS | Status: AC
  Administered 2013-06-14: 500 [IU] via INTRAVENOUS

## 2013-06-14 MED ORDER — HEPARIN SOD (PORK) LOCK FLUSH 100 UNIT/ML IV SOLN
INTRAVENOUS | Status: AC
  Filled 2013-06-14: qty 5

## 2013-06-14 MED ORDER — MIDAZOLAM HCL 2 MG/2ML IJ SOLN
INTRAMUSCULAR | Status: AC | PRN
  Administered 2013-06-14 (×2): 2 mg via INTRAVENOUS

## 2013-06-14 NOTE — H&P (Signed)
Chief Complaint: "I'm here for a port" Referring Physician:Sherrill HPI: Bianca Logan is an 62 y.o. female with cholangiocarcinoma. She has an int/ext biliary drain, but could not be stented. She is to start chemotherapy soon and is here today for port placement. PMHx and meds reviewed.  Past Medical History:  Past Medical History  Diagnosis Date  . 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 GERD (gastroesophageal reflux disease)   . Cancer 05/02/13    cholangiocarcinoma  . Bone cancer 05/15/13    Bone mets  . Contact lens/glasses fitting     wears contacts or glasses    Past Surgical History:  Past Surgical History  Procedure Laterality Date  . Cyst removed from left breast  01/1999  . Ercp N/A 05/02/2013    Procedure: ENDOSCOPIC RETROGRADE CHOLANGIOPANCREATOGRAPHY (ERCP);  Surgeon: Willis Modena, MD;  Location: [facility];  Service: Endoscopy;  Laterality: N/A;  . Biliary stent placement N/A 05/02/2013    Procedure: BILIARY STENT PLACEMENT;  Surgeon: Willis Modena, MD;  Location: [facility];  Service: Endoscopy;  Laterality: N/A;  . Eus N/A 05/02/2013    Procedure: FULL UPPER ENDOSCOPIC ULTRASOUND (EUS) RADIAL;  Surgeon: Willis Modena, MD;  Location: [facility];  Service: Endoscopy;  Laterality: N/A;  . Breast surgery  2000    lt br cyst  . Tibia im nail insertion Left 05/28/2013    Procedure: INTRAMEDULLARY (IM) NAIL LEFT TIBIAL;  Surgeon: Velna Ochs, MD;  Location: [facility];  Service: Orthopedics;  Laterality: Left;    Family History:  Family History  Problem Relation Age of Onset  . Cancer Mother     Undetermined  . Heart attack Father   . Cancer Father     prostate  . Anuerysm Sister   . Obesity Brother     Social History:  reports that she quit smoking about 28 years ago. Her smoking use included Cigarettes. She started smoking about 28 years ago. She has a 18 pack-year smoking history. She has never used smokeless tobacco. She  reports that she drinks about 2.4 ounces of alcohol per week. She reports that she does not use illicit drugs.  Allergies: No Known Allergies  Medications:   Medication List    ASK your doctor about these medications       calcium-vitamin D 500-200 MG-UNIT per tablet  Commonly known as:  OSCAL WITH D  Take 1 tablet by mouth daily with breakfast.     dexamethasone 4 MG tablet  Commonly known as:  DECADRON  Take 4 mg by mouth daily.     lidocaine-prilocaine cream  Commonly known as:  EMLA  Apply 1 application topically daily as needed (for pain on port).     morphine 30 MG 12 hr tablet  Commonly known as:  MS CONTIN  Take 1 tablet (30 mg total) by mouth every 12 (twelve) hours.     morphine 15 MG tablet  Commonly known as:  MSIR  Take 15-30 mg by mouth every 4 (four) hours as needed for severe pain.     multivitamin with minerals Tabs tablet  Take 1 tablet by mouth daily.     omeprazole 20 MG capsule  Commonly known as:  PRILOSEC  Take 20 mg by mouth 2 (two) times daily.     ondansetron 4 MG tablet  Commonly known as:  ZOFRAN  Take 4 mg by mouth every 6 (six) hours as needed.     polyethylene glycol packet  Commonly known as:  MIRALAX / GLYCOLAX  Take 17 g by mouth daily as needed for moderate constipation.     prochlorperazine 10 MG tablet  Commonly known as:  COMPAZINE  Take 10 mg by mouth every 6 (six) hours as needed for nausea or vomiting.     sennosides-docusate sodium 8.6-50 MG tablet  Commonly known as:  SENOKOT-S  Take 1 tablet by mouth 2 (two) times daily.        Please HPI for pertinent positives, otherwise complete 10 system ROS negative.  Physical Exam: BP 118/76  Pulse 119  Temp(Src) 97.9 F (36.6 C) (Oral)  Resp 20 There is no weight on file to calculate BMI.   General Appearance:  Alert, cooperative, no distress, appears stated age  Head:  Normocephalic, without obvious abnormality, atraumatic  ENT: Unremarkable  Neck: Supple,  symmetrical, trachea midline  Lungs:   Clear to auscultation bilaterally, no w/r/r.  Chest Wall:  No tenderness or deformity  Heart:  Regular rate and rhythm, S1, S2 normal, no murmur, rub or gallop.  Abdomen:   Soft, non-tender, non distended. Drain intact.  Neurologic: Normal affect, no gross deficits.   Results for orders placed during the hospital encounter of 06/14/13 (from the past 48 hour(s))  CBC     Status: Abnormal   Collection Time    06/14/13 12:15 PM      Result Value Range   WBC 10.3  4.0 - 10.5 K/uL   RBC 3.87  3.87 - 5.11 MIL/uL   Hemoglobin 10.9 (*) 12.0 - 15.0 g/dL   HCT 16.1 (*) 09.6 - 04.5 %   MCV 80.6  78.0 - 100.0 fL   MCH 28.2  26.0 - 34.0 pg   MCHC 34.9  30.0 - 36.0 g/dL   RDW 40.9  81.1 - 91.4 %   Platelets 162  150 - 400 K/uL   No results found.  Assessment/Plan Cholangiocarcinoma For Port placement. Explained procedure, risks, complications, use of sedation. Labs pending Consent signed in chart  Brayton El PA-C 06/14/2013, 12:42 PM

## 2013-06-14 NOTE — Procedures (Signed)
Procedure:  Porta-cath Access:  Right IJ vein Tip of port-a-cath at cavoatrial junction.  No PTX.  OK to use.

## 2013-06-14 NOTE — H&P (Signed)
For port placement today.  Agree with above H&P.

## 2013-06-15 ENCOUNTER — Other Ambulatory Visit: Payer: Self-pay | Admitting: Oncology

## 2013-06-15 DIAGNOSIS — E876 Hypokalemia: Secondary | ICD-10-CM

## 2013-06-15 MED ORDER — POTASSIUM CHLORIDE CRYS ER 20 MEQ PO TBCR: 20.0000 meq | EXTENDED_RELEASE_TABLET | Freq: Every day | ORAL | Status: DC

## 2013-06-17 NOTE — Progress Notes (Addendum)
  Radiation Oncology         [phone]) [phone] ________________________________  Name: Bianca Logan MRN: [id number]  Date: 06/12/2013  DOB: 1950/12/29  End of Treatment Note  Diagnosis:   Metastatic cholangiocarcinoma     Indication for treatment:  Curative       Radiation treatment dates:  06/12/2013  Site/dose:   Left tibia was treated to a total dose of 8 Gy in a single fraction after fixation by orthopedicu surgery.  Beams/energy:   AP/PA with 6 and 10 MV photons  Narrative: The patient tolerated radiation treatment relatively well.   She went on to receiving chemotherapy. Her pain was controlled on MSIR and MSContin.   Plan: The patient has completed radiation treatment. The patient will return to radiation oncology clinic for routine followup in one month. I advised them to call or return sooner if they have any questions or concerns related to their recovery or treatment.  ------------------------------------------------  Lurline Hare, MD

## 2013-06-19 ENCOUNTER — Ambulatory Visit: Payer: BC Managed Care – PPO | Admitting: Nutrition

## 2013-06-19 ENCOUNTER — Ambulatory Visit (HOSPITAL_BASED_OUTPATIENT_CLINIC_OR_DEPARTMENT_OTHER): Payer: BC Managed Care – PPO | Admitting: Oncology

## 2013-06-19 ENCOUNTER — Telehealth: Payer: Self-pay | Admitting: Oncology

## 2013-06-19 ENCOUNTER — Ambulatory Visit (HOSPITAL_BASED_OUTPATIENT_CLINIC_OR_DEPARTMENT_OTHER): Payer: BC Managed Care – PPO

## 2013-06-19 ENCOUNTER — Other Ambulatory Visit (HOSPITAL_BASED_OUTPATIENT_CLINIC_OR_DEPARTMENT_OTHER): Payer: BC Managed Care – PPO

## 2013-06-19 VITALS — BP 112/75 | HR 118 | Temp 96.8°F | Resp 20 | Ht 64.0 in | Wt 126.1 lb

## 2013-06-19 DIAGNOSIS — C801 Malignant (primary) neoplasm, unspecified: Secondary | ICD-10-CM

## 2013-06-19 DIAGNOSIS — Z5111 Encounter for antineoplastic chemotherapy: Secondary | ICD-10-CM

## 2013-06-19 DIAGNOSIS — C7951 Secondary malignant neoplasm of bone: Secondary | ICD-10-CM

## 2013-06-19 DIAGNOSIS — C786 Secondary malignant neoplasm of retroperitoneum and peritoneum: Secondary | ICD-10-CM

## 2013-06-19 DIAGNOSIS — L989 Disorder of the skin and subcutaneous tissue, unspecified: Secondary | ICD-10-CM

## 2013-06-19 DIAGNOSIS — C221 Intrahepatic bile duct carcinoma: Secondary | ICD-10-CM

## 2013-06-19 DIAGNOSIS — C787 Secondary malignant neoplasm of liver and intrahepatic bile duct: Secondary | ICD-10-CM

## 2013-06-19 DIAGNOSIS — C419 Malignant neoplasm of bone and articular cartilage, unspecified: Secondary | ICD-10-CM

## 2013-06-19 DIAGNOSIS — M899 Disorder of bone, unspecified: Secondary | ICD-10-CM

## 2013-06-19 LAB — CBC WITH DIFFERENTIAL/PLATELET
Basophils Absolute: 0 10*3/uL (ref 0.0–0.1)
Eosinophils Absolute: 0.2 10*3/uL (ref 0.0–0.5)
HCT: 33.3 % — ABNORMAL LOW (ref 34.8–46.6)
LYMPH%: 11.6 % — ABNORMAL LOW (ref 14.0–49.7)
MONO#: 1.4 10*3/uL — ABNORMAL HIGH (ref 0.1–0.9)
NEUT#: 8.9 10*3/uL — ABNORMAL HIGH (ref 1.5–6.5)
Platelets: 365 10*3/uL (ref 145–400)
RBC: 4 10*6/uL (ref 3.70–5.45)
WBC: 11.9 10*3/uL — ABNORMAL HIGH (ref 3.9–10.3)
lymph#: 1.4 10*3/uL (ref 0.9–3.3)

## 2013-06-19 LAB — COMPREHENSIVE METABOLIC PANEL (CC13)
ALT: 35 U/L (ref 0–55)
Albumin: 2.7 g/dL — ABNORMAL LOW (ref 3.5–5.0)
Anion Gap: 12 mEq/L — ABNORMAL HIGH (ref 3–11)
BUN: 8.2 mg/dL (ref 7.0–26.0)
CO2: 24 mEq/L (ref 22–29)
Calcium: 9.9 mg/dL (ref 8.4–10.4)
Chloride: 98 mEq/L (ref 98–109)
Glucose: 113 mg/dl (ref 70–140)
Potassium: 4.6 mEq/L (ref 3.5–5.1)
Sodium: 133 mEq/L — ABNORMAL LOW (ref 136–145)
Total Protein: 6.6 g/dL (ref 6.4–8.3)

## 2013-06-19 LAB — MAGNESIUM (CC13): Magnesium: 1.8 mg/dl (ref 1.5–2.5)

## 2013-06-19 MED ORDER — HEPARIN SOD (PORK) LOCK FLUSH 100 UNIT/ML IV SOLN
500.0000 [IU] | Freq: Once | INTRAVENOUS | Status: AC | PRN
  Administered 2013-06-19: 500 [IU]
  Filled 2013-06-19: qty 5

## 2013-06-19 MED ORDER — OXALIPLATIN CHEMO INJECTION 100 MG/20ML
80.0000 mg/m2 | Freq: Once | INTRAVENOUS | Status: AC
  Administered 2013-06-19: 135 mg via INTRAVENOUS
  Filled 2013-06-19: qty 27

## 2013-06-19 MED ORDER — SODIUM CHLORIDE 0.9 % IJ SOLN
10.0000 mL | INTRAMUSCULAR | Status: DC | PRN
  Administered 2013-06-19: 10 mL
  Filled 2013-06-19: qty 10

## 2013-06-19 MED ORDER — DEXAMETHASONE SODIUM PHOSPHATE 10 MG/ML IJ SOLN
INTRAMUSCULAR | Status: AC
  Filled 2013-06-19: qty 1

## 2013-06-19 MED ORDER — DEXAMETHASONE SODIUM PHOSPHATE 10 MG/ML IJ SOLN
10.0000 mg | Freq: Once | INTRAMUSCULAR | Status: AC
  Administered 2013-06-19: 10 mg via INTRAVENOUS

## 2013-06-19 MED ORDER — SODIUM CHLORIDE 0.9 % IV SOLN
800.0000 mg/m2 | Freq: Once | INTRAVENOUS | Status: AC
  Administered 2013-06-19: 1330 mg via INTRAVENOUS
  Filled 2013-06-19: qty 34.98

## 2013-06-19 MED ORDER — ONDANSETRON 8 MG/50ML IVPB (CHCC)
8.0000 mg | Freq: Once | INTRAVENOUS | Status: AC
  Administered 2013-06-19: 8 mg via INTRAVENOUS

## 2013-06-19 MED ORDER — ONDANSETRON 8 MG/NS 50 ML IVPB
INTRAVENOUS | Status: AC
  Filled 2013-06-19: qty 8

## 2013-06-19 MED ORDER — DEXTROSE 5 % IV SOLN
Freq: Once | INTRAVENOUS | Status: AC
  Administered 2013-06-19: 13:00:00 via INTRAVENOUS

## 2013-06-19 NOTE — Progress Notes (Signed)
I spoke with patient who reports she feels well.  Weight was documented as 126.1 pounds December 10 down from 128.2 pounds November 26.  Patient reports improved appetite and improve oral intake.  She denies problems eating at this time.  She denies nutrition side effects.  She will begin physical therapy for her hip.  Nutrition diagnosis: Food and nutrition related knowledge deficit improved.  Intervention: Patient provided support and encouragement to continue increased oral intake including adequate protein and calories to promote healing and weight maintenance.  Patient educated to continue orgain supplements daily.  Teach back method used.  Monitoring, evaluation, goals: Patient will tolerate increased calories and protein to minimize further weight loss and promote healing.  Next visit: Monday, December 29, during chemotherapy.

## 2013-06-19 NOTE — Patient Instructions (Signed)
[facility] Discharge Instructions for Patients Receiving Chemotherapy  Today you received the following chemotherapy agents Gemzar and Oxaliplatin.  To help prevent nausea and vomiting after your treatment, we encourage you to take your nausea medication.   If you develop nausea and vomiting that is not controlled by your nausea medication, call the clinic.   BELOW ARE SYMPTOMS THAT SHOULD BE REPORTED IMMEDIATELY:  *FEVER GREATER THAN 100.5 F  *CHILLS WITH OR WITHOUT FEVER  NAUSEA AND VOMITING THAT IS NOT CONTROLLED WITH YOUR NAUSEA MEDICATION  *UNUSUAL SHORTNESS OF BREATH  *UNUSUAL BRUISING OR BLEEDING  TENDERNESS IN MOUTH AND THROAT WITH OR WITHOUT PRESENCE OF ULCERS  *URINARY PROBLEMS  *BOWEL PROBLEMS  UNUSUAL RASH Items with * indicate a potential emergency and should be followed up as soon as possible.  Feel free to call the clinic you have any questions or concerns. The clinic phone number is [phone].

## 2013-06-19 NOTE — Telephone Encounter (Signed)
gv and printed papt sched and avs forpt for DEC and Jan ...sed added tx.

## 2013-06-20 ENCOUNTER — Other Ambulatory Visit: Payer: Self-pay | Admitting: *Deleted

## 2013-06-20 ENCOUNTER — Ambulatory Visit: Payer: BC Managed Care – PPO | Admitting: Radiation Oncology

## 2013-06-20 LAB — CANCER ANTIGEN 19-9: CA 19-9: 1.4 U/mL (ref <35.0)

## 2013-06-20 MED ORDER — MORPHINE SULFATE 15 MG PO TABS: 15.0000 mg | ORAL_TABLET | ORAL | Status: DC | PRN

## 2013-06-20 MED ORDER — ONDANSETRON HCL 4 MG PO TABS: 4.0000 mg | ORAL_TABLET | Freq: Four times a day (QID) | ORAL | Status: DC | PRN

## 2013-06-20 MED ORDER — OMEPRAZOLE 20 MG PO CPDR: 20.0000 mg | DELAYED_RELEASE_CAPSULE | Freq: Two times a day (BID) | ORAL | Status: DC

## 2013-06-20 MED ORDER — DEXAMETHASONE 4 MG PO TABS: 2.0000 mg | ORAL_TABLET | Freq: Every day | ORAL | Status: DC

## 2013-06-20 NOTE — Progress Notes (Signed)
[facility]    OFFICE PROGRESS NOTE   INTERVAL HISTORY:   She returns as scheduled. She completed radiation to the left lower leg on 06/12/2013. She is starting a physical therapy program.  [name] reports no pain at the left leg. She has pain and "stiffness "at the right hip, chiefly in the mornings. The pain is relieved with MSIR.  She noted fatigue following the last cycle of chemotherapy. No nausea or vomiting. Cold sensitivity lasted 2 or 3 days. No neuropathy symptoms at present. Good appetite.  Objective:  Vital signs in last 24 hours:  Blood pressure 112/75, pulse 118, temperature 96.8 F (36 C), temperature source Oral, resp. rate 20, height 5\' 4"  (1.626 m), weight 126 lb 1.6 oz (57.199 kg).    HEENT: Sclera anicteric, no thrush or ulcer Resp: Lungs clear bilaterally Cardio: Regular rate and rhythm GI: No hepatomegaly, nontender, biliary drain site with a gauze dressing Vascular: No right leg edema. Left leg in an orthopedic boot Neuro: The vibratory sense is intact at the fingertips bilaterally    Portacath/PICC-without erythema  Lab Results:  Lab Results  Component Value Date   WBC 11.9* 06/19/2013   HGB 11.2* 06/19/2013   HCT 33.3* 06/19/2013   MCV 83.3 06/19/2013   PLT 365 06/19/2013   ANC 8.9    Medications: I have reviewed the patient's current medications.  Assessment/Plan: 1. 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 Staging chest CT 05/15/2013 showed a 1.3 cm soft tissue mass within the medial aspect of the right breast; 1.9 cm left supraclavicular lymph node; multiple enlarged mediastinal lymph nodes; 3 mm left upper lobe pulmonary nodule. Repeat CT abdomen and pelvis 05/15/2013 showed interval increase in the size of previously visualized lesions throughout the liver and multiple  additional new lesions; bulky retroperitoneal lymphadenopathy was stable; extensive porta hepatis lymphadenopathy was redemonstrated; 7 mm soft tissue nodule in the subcutaneous fat of the anterior abdominal wall; 2.5 cm calcified lesion within the left hemipelvis; interval development of a pathologic compression fracture of the L3 vertebral body with extension of soft tissue components into the canal which was narrowed approximately 50%; metastatic involvement of the T9 vertebral body with small amount of extension into the vertebral canal; small amount of abnormal enhancing soft tissue at T7 with mild narrowing of the canal; destructive 2.6 cm lesion involving the lateral aspect of the left third rib. Palliative radiation to T9 and L3 on 05/21/2013  Cycle 1 gemcitabine/oxaliplatin on 05/22/2013 Cycle 2 gemcitabine/oxaliplatin on 06/05/2013 2. Obstructive jaundice secondary to #1 status post placement of a percutaneous biliary drain on 05/02/2013. Cholangiogram 05/16/2013 showed the existing left internal/external biliary drain was well positioned. The biliary system was decompressed. The internal/external biliary drain was exchanged. The jaundice has resolved. 3. Pain secondary to bone metastases involving the lumbar spine, retroperitoneal adenopathy. The pain has improved. She continues to have pain at the right "hip ". She is taking MS Contin and MSIR.  4. Anorexia/weight loss secondary to #1. Improved appetite. 5. Subcutaneous lesion right scalp. Question lymph node.       6.   Pain/tenderness at the left lower tibia-lytic lesion confirmed on a plain x-ray 05/24/2013, status post an intramedullary              nail on 05/28/2013, radiation on 06/12/2013     Disposition:  Her overall performance status has improved. The plan  is to proceed with cycle 3 gemcitabine/oxaliplatin today. She will return for an office visit and cycle 4 on 07/08/2013.  The right "hip "discomfort is most likely related to  radicular pain from the spine. She will continue MS Contin and MSIR. We decreased the Decadron to 2 mg daily.   Thornton Papas, MD  06/20/2013  8:12 AM

## 2013-06-20 NOTE — Telephone Encounter (Signed)
Message from pt requesting refill on MSIR. Rx will be left in prescription book for pick up.

## 2013-06-27 ENCOUNTER — Encounter (HOSPITAL_COMMUNITY): Payer: Self-pay | Admitting: Emergency Medicine

## 2013-06-27 ENCOUNTER — Emergency Department (HOSPITAL_COMMUNITY): Payer: BC Managed Care – PPO

## 2013-06-27 ENCOUNTER — Inpatient Hospital Stay (HOSPITAL_COMMUNITY)
Admission: EM | Admit: 2013-06-27 | Discharge: 2013-07-01 | DRG: 871 | Disposition: A | Discharge status: Home / Self Care | Payer: BC Managed Care – PPO | Attending: Internal Medicine | Admitting: Internal Medicine

## 2013-06-27 DIAGNOSIS — C7951 Secondary malignant neoplasm of bone: Secondary | ICD-10-CM | POA: Diagnosis present

## 2013-06-27 DIAGNOSIS — D638 Anemia in other chronic diseases classified elsewhere: Secondary | ICD-10-CM | POA: Diagnosis present

## 2013-06-27 DIAGNOSIS — IMO0002 Reserved for concepts with insufficient information to code with codable children: Secondary | ICD-10-CM

## 2013-06-27 DIAGNOSIS — C221 Intrahepatic bile duct carcinoma: Secondary | ICD-10-CM

## 2013-06-27 DIAGNOSIS — Z79899 Other long term (current) drug therapy: Secondary | ICD-10-CM

## 2013-06-27 DIAGNOSIS — G893 Neoplasm related pain (acute) (chronic): Secondary | ICD-10-CM | POA: Diagnosis present

## 2013-06-27 DIAGNOSIS — K8020 Calculus of gallbladder without cholecystitis without obstruction: Secondary | ICD-10-CM | POA: Diagnosis present

## 2013-06-27 DIAGNOSIS — K219 Gastro-esophageal reflux disease without esophagitis: Secondary | ICD-10-CM | POA: Diagnosis present

## 2013-06-27 DIAGNOSIS — C419 Malignant neoplasm of bone and articular cartilage, unspecified: Secondary | ICD-10-CM | POA: Diagnosis present

## 2013-06-27 DIAGNOSIS — E871 Hypo-osmolality and hyponatremia: Secondary | ICD-10-CM | POA: Diagnosis present

## 2013-06-27 DIAGNOSIS — K8309 Other cholangitis: Secondary | ICD-10-CM | POA: Diagnosis present

## 2013-06-27 DIAGNOSIS — Z87891 Personal history of nicotine dependence: Secondary | ICD-10-CM

## 2013-06-27 DIAGNOSIS — A419 Sepsis, unspecified organism: Principal | ICD-10-CM | POA: Diagnosis present

## 2013-06-27 DIAGNOSIS — Z87311 Personal history of (healed) other pathological fracture: Secondary | ICD-10-CM

## 2013-06-27 DIAGNOSIS — E876 Hypokalemia: Secondary | ICD-10-CM | POA: Diagnosis present

## 2013-06-27 DIAGNOSIS — R932 Abnormal findings on diagnostic imaging of liver and biliary tract: Secondary | ICD-10-CM | POA: Diagnosis present

## 2013-06-27 DIAGNOSIS — K831 Obstruction of bile duct: Secondary | ICD-10-CM | POA: Diagnosis present

## 2013-06-27 DIAGNOSIS — G8929 Other chronic pain: Secondary | ICD-10-CM | POA: Diagnosis present

## 2013-06-27 DIAGNOSIS — D649 Anemia, unspecified: Secondary | ICD-10-CM

## 2013-06-27 DIAGNOSIS — R6521 Severe sepsis with septic shock: Secondary | ICD-10-CM

## 2013-06-27 DIAGNOSIS — K5900 Constipation, unspecified: Secondary | ICD-10-CM

## 2013-06-27 DIAGNOSIS — E43 Unspecified severe protein-calorie malnutrition: Secondary | ICD-10-CM | POA: Diagnosis present

## 2013-06-27 DIAGNOSIS — R6510 Systemic inflammatory response syndrome (SIRS) of non-infectious origin without acute organ dysfunction: Secondary | ICD-10-CM | POA: Diagnosis present

## 2013-06-27 DIAGNOSIS — C787 Secondary malignant neoplasm of liver and intrahepatic bile duct: Secondary | ICD-10-CM | POA: Diagnosis present

## 2013-06-27 MED ORDER — SODIUM CHLORIDE 0.9 % IV BOLUS (SEPSIS)
1000.0000 mL | Freq: Once | INTRAVENOUS | Status: AC
  Administered 2013-06-27: 1000 mL via INTRAVENOUS

## 2013-06-27 MED ORDER — SODIUM CHLORIDE 0.9 % IJ SOLN
INTRAMUSCULAR | Status: AC
  Administered 2013-06-28: 03:00:00
  Filled 2013-06-27: qty 10

## 2013-06-27 MED ORDER — ACETAMINOPHEN 325 MG PO TABS: 650.0000 mg | ORAL_TABLET | Freq: Once | ORAL | Status: DC

## 2013-06-27 MED ORDER — ACETAMINOPHEN 500 MG PO TABS
1000.0000 mg | ORAL_TABLET | Freq: Once | ORAL | Status: AC
  Administered 2013-06-28: 1000 mg via ORAL
  Filled 2013-06-27: qty 2

## 2013-06-27 MED ORDER — SODIUM CHLORIDE 0.9 % IV BOLUS (SEPSIS): 1000.0000 mL | Freq: Once | INTRAVENOUS | Status: DC

## 2013-06-27 NOTE — ED Notes (Signed)
Patient is alert with some confusion.  Husband states that after dinner that the patient started feeling chills and hot. Patient is a cancer patient of Dr. Myrle Sheng.  She has right leg pain that she currently rates her pain 6 of 10.  The on call Dr. Was called and she was told to come to the ED

## 2013-06-27 NOTE — ED Notes (Signed)
[facility] EDP given CG4 Lactic results. 

## 2013-06-27 NOTE — ED Provider Notes (Signed)
CSN: [id number]     Arrival date & time 06/27/13  2259 History   First MD Initiated Contact with Patient 06/27/13 2307     Chief Complaint  Patient presents with  . Altered Mental Status  . 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  Over the past couple of months she had multiple studies showing that she had a biliary obstruction secondary to the cholangiocarcinoma and had an external biliary drain placed. This has not been draining, the husband states that it gets flushed as per protocol, her overall symptoms related to the cancer have been stable, she is on frequent morphine, has been using a steroid and is now tapering off of the steroid and has been on chemotherapy x3 doses the most recent of which has been 8 days ago. She presents this evening with a fever as high as 103 at home with slight confusion, slight urinary frequency but no other significant complaints. The patient denies cough, stiff neck, headache, rash, sore throat. She has been using senna for constipation and has had soft to loose stools. She denies dysuria, hematuria, rectal bleeding.  Patient is a 62 y.o. female presenting with altered mental status and fever. The history is provided by the patient, the spouse and medical records.  Altered Mental Status Associated symptoms: fever   Fever   Past Medical History  Diagnosis Date  . 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 GERD (gastroesophageal reflux disease)   . Cancer 05/02/13    cholangiocarcinoma  . Bone cancer 05/15/13    Bone mets  . Contact lens/glasses fitting     wears contacts or glasses   Past Surgical History  Procedure Laterality Date  . Cyst removed from left breast  01/1999  . Ercp N/A 05/02/2013    Procedure: ENDOSCOPIC RETROGRADE CHOLANGIOPANCREATOGRAPHY  (ERCP);  Surgeon: Willis Modena, MD;  Location: [facility];  Service: Endoscopy;  Laterality: N/A;  . Biliary stent placement N/A 05/02/2013    Procedure: BILIARY STENT PLACEMENT;  Surgeon: Willis Modena, MD;  Location: [facility];  Service: Endoscopy;  Laterality: N/A;  . Eus N/A 05/02/2013    Procedure: FULL UPPER ENDOSCOPIC ULTRASOUND (EUS) RADIAL;  Surgeon: Willis Modena, MD;  Location: [facility];  Service: Endoscopy;  Laterality: N/A;  . Breast surgery  2000    lt br cyst  . Tibia im nail insertion Left 05/28/2013    Procedure: INTRAMEDULLARY (IM) NAIL LEFT TIBIAL;  Surgeon: Velna Ochs, MD;  Location: [facility];  Service: Orthopedics;  Laterality: Left;   Family History  Problem Relation Age of Onset  . Cancer Mother     Undetermined  . Heart attack Father   . Cancer Father     prostate  . Anuerysm Sister   . Obesity Brother    History  Substance Use Topics  . Smoking status: Former Smoker -- 1.50 packs/day for 12 years    Types: Cigarettes    Start date: 07/11/1984    Quit date: 04/26/1985  . Smokeless tobacco: Never Used  . Alcohol Use: 2.4 oz/week    2 Glasses of wine, 2 Cans of beer per week     Comment: social    OB History   Grav Para Term Preterm Abortions TAB SAB Ect Mult Living                 Review of Systems  Constitutional:  Positive for fever.  All other systems reviewed and are negative.    Allergies  Review of patient's allergies indicates no known allergies.  Home Medications   Current Outpatient Rx  Name  Route  Sig  Dispense  Refill  . calcium-vitamin D (OSCAL WITH D) 500-200 MG-UNIT per tablet   Oral   Take 1 tablet by mouth daily with breakfast.         . dexamethasone (DECADRON) 4 MG tablet   Oral   Take 0.5 tablets (2 mg total) by mouth daily.         Marland Kitchen lidocaine-prilocaine (EMLA) cream   Topical   Apply 1 application topically daily as needed (for pain on port).         . morphine (MS  CONTIN) 30 MG 12 hr tablet   Oral   Take 1 tablet (30 mg total) by mouth every 12 (twelve) hours.   60 tablet   0   . morphine (MSIR) 15 MG tablet   Oral   Take 1-2 tablets (15-30 mg total) by mouth every 4 (four) hours as needed for severe pain.   100 tablet   0   . Multiple Vitamin (MULTIVITAMIN WITH MINERALS) TABS tablet   Oral   Take 1 tablet by mouth daily.         Marland Kitchen omeprazole (PRILOSEC) 20 MG capsule   Oral   Take 1 capsule (20 mg total) by mouth 2 (two) times daily.   60 capsule   2   . ondansetron (ZOFRAN) 4 MG tablet   Oral   Take 1 tablet (4 mg total) by mouth every 6 (six) hours as needed.   20 tablet   3   . polyethylene glycol (MIRALAX / GLYCOLAX) packet   Oral   Take 17 g by mouth daily as needed for moderate constipation.         . potassium chloride SA (K-DUR,KLOR-CON) 20 MEQ tablet   Oral   Take 1 tablet (20 mEq total) by mouth daily.   30 tablet   2   . prochlorperazine (COMPAZINE) 10 MG tablet   Oral   Take 10 mg by mouth every 6 (six) hours as needed for nausea or vomiting.         . sennosides-docusate sodium (SENOKOT-S) 8.6-50 MG tablet   Oral   Take 1 tablet by mouth 2 (two) times daily.          BP 102/73  Pulse 140  Temp(Src) 103.7 F (39.8 C) (Rectal)  SpO2 99% Physical Exam  Nursing note and vitals reviewed. Constitutional: She appears well-developed and well-nourished.  Uncomfortable appearing  HENT:  Head: Normocephalic and atraumatic.  Mouth/Throat: Oropharynx is clear and moist. No oropharyngeal exudate.  Mucous membranes are moist, pharynx is clear  Eyes: Conjunctivae and EOM are normal. Pupils are equal, round, and reactive to light. Right eye exhibits no discharge. Left eye exhibits no discharge. No scleral icterus.  Neck: Normal range of motion. Neck supple. No JVD present. No thyromegaly present.  Supple neck, no lymphadenopathy, no meningismus, no torticollis or trismus  Cardiovascular: Regular rhythm, normal  heart sounds and intact distal pulses.  Exam reveals no gallop and no friction rub.   No murmur heard. Tachycardia at 140, weak pulses at the radial arteries, no JVD  Pulmonary/Chest: Effort normal and breath sounds normal. No respiratory distress. She has no wheezes. She has no rales.  No abnormal respiratory sounds, no wheezing rales, normal phonation  Abdominal:  Soft. Bowel sounds are normal. She exhibits no distension and no mass. There is tenderness.  Mild suprapubic tenderness, no guarding, no CVA tenderness  Musculoskeletal: Normal range of motion. She exhibits no edema and no tenderness.  Mild tenderness over the spine  Lymphadenopathy:    She has no cervical adenopathy.  Neurological: She is alert. Coordination normal.  Answer all questions appropriately, moves all extremities x4 without difficulty, cranial nerves III through XII intact., Normal coordination  Skin: Skin is warm and dry. No rash noted. No erythema.  Psychiatric: She has a normal mood and affect. Her behavior is normal.    ED Course  Procedures (including critical care time) Labs Review Labs Reviewed  CBC WITH DIFFERENTIAL - Abnormal; Notable for the following:    WBC 14.4 (*)    Hemoglobin 11.3 (*)    HCT 32.1 (*)    RDW 16.3 (*)    Neutrophils Relative % 88 (*)    Lymphocytes Relative 5 (*)    Neutro Abs 12.7 (*)    All other components within normal limits  COMPREHENSIVE METABOLIC PANEL - Abnormal; Notable for the following:    Sodium 129 (*)    Potassium 3.1 (*)    Chloride 91 (*)    Glucose, Bld 111 (*)    Albumin 2.7 (*)    ALT 38 (*)    Alkaline Phosphatase 352 (*)    All other components within normal limits  CG4 I-STAT (LACTIC ACID) - Abnormal; Notable for the following:    Lactic Acid, Venous 4.52 (*)    All other components within normal limits  CULTURE, BLOOD (ROUTINE X 2)  CULTURE, BLOOD (ROUTINE X 2)  URINALYSIS, ROUTINE W REFLEX MICROSCOPIC   Imaging Review Dg Chest Port 1  View  06/28/2013   CLINICAL DATA:  Shortness of breath  EXAM: PORTABLE CHEST - 1 VIEW  COMPARISON:  Chest CT 05/15/2013  FINDINGS: Right IJ porta catheter, tip at the superior cavoatrial junction. Normal heart size. No edema or infiltrate. No effusion or pneumothorax. No definitive pulmonary nodule. Known osseous metastatic disease to the lateral left 2nd rib.  IMPRESSION: No active cardiopulmonary disease.   Electronically Signed   By: Tiburcio Pea M.D.   On: 06/28/2013 00:27    EKG Interpretation   None       MDM   1. Septic shock   2. Cholangiocarcinoma    The patient is known to have metastatic disease to her left tibia and has had a prophylactic intramedullary nail, metastatic disease to the spine which causes chronic pain and metastatic disease to the liver. She does not appear jaundiced, she does appear septic and is febrile to 103, is hypotensive and tachycardic. I have personally placed 2 peripheral IVs as the patient does appear to need IV fluid resuscitation. I'm not sure of the source of her fever that could be urinary and she has had some frequency this evening. She has not had chemotherapy in 8 days, she is unsure of her blood counts. Lab work started, 2 L of normal saline had been started.  Angiocath insertion Performed by: Vida Roller  Consent: Verbal consent obtained. Risks and benefits: risks, benefits and alternatives were discussed Time out: Immediately prior to procedure a "time out" was called to verify the correct patient, procedure, equipment, support staff and site/side marked as required.  Preparation: Patient was prepped and draped in the usual sterile fashion.  Vein Location: L AC  Not Ultrasound Guided  Gauge: 18  Normal  blood return and flush without difficulty Patient tolerance: Patient tolerated the procedure well with no immediate complications.   Angiocath insertion Performed by: Vida Roller  Consent: Verbal consent obtained. Risks  and benefits: risks, benefits and alternatives were discussed Time out: Immediately prior to procedure a "time out" was called to verify the correct patient, procedure, equipment, support staff and site/side marked as required.  Preparation: Patient was prepped and draped in the usual sterile fashion.  Vein Location: L forearm  Not Ultrasound Guided  Gauge: 20  Normal blood return and flush without difficulty Patient tolerance: Patient tolerated the procedure well with no immediate complications.   Patient has had a slight improvement in her blood pressure and now has a systolic of 103, this is after Tylenol and 2 L of IV fluids. I have ordered broad spectrum antibiotics and obtain blood cultures. Her lactic acid is elevated at 4.5 consistent with a sepsis state. Her shock is slowly improving though she is critically ill. I have ordered cefepime, discussed her care with the hospitalist who will admit her to a step down bed. Her white blood cell count is elevated at 14,000, renal function is preserved, no urinalysis obtained at this time as she does not have any urge to urinate. Will avoid catheterization of any orifice given her history of cancer and fever.  CRITICAL CARE Performed by: Vida Roller Total critical care time: 35 Critical care time was exclusive of separately billable procedures and treating other patients. Critical care was necessary to treat or prevent imminent or life-threatening deterioration. Critical care was time spent personally by me on the following activities: development of treatment plan with patient and/or surrogate as well as nursing, discussions with consultants, evaluation of patient's response to treatment, examination of patient, obtaining history from patient or surrogate, ordering and performing treatments and interventions, ordering and review of laboratory studies, ordering and review of radiographic studies, pulse oximetry and re-evaluation of patient's  condition.       Vida Roller, MD 06/28/13 0111

## 2013-06-27 NOTE — ED Notes (Signed)
Labs collected by B. Miller EDP not E. Elly Haffey.

## 2013-06-28 ENCOUNTER — Inpatient Hospital Stay (HOSPITAL_COMMUNITY): Payer: BC Managed Care – PPO

## 2013-06-28 ENCOUNTER — Encounter (HOSPITAL_COMMUNITY): Payer: Self-pay | Admitting: Internal Medicine

## 2013-06-28 DIAGNOSIS — A419 Sepsis, unspecified organism: Principal | ICD-10-CM

## 2013-06-28 DIAGNOSIS — C221 Intrahepatic bile duct carcinoma: Secondary | ICD-10-CM

## 2013-06-28 DIAGNOSIS — E871 Hypo-osmolality and hyponatremia: Secondary | ICD-10-CM | POA: Diagnosis present

## 2013-06-28 DIAGNOSIS — D649 Anemia, unspecified: Secondary | ICD-10-CM

## 2013-06-28 DIAGNOSIS — R6510 Systemic inflammatory response syndrome (SIRS) of non-infectious origin without acute organ dysfunction: Secondary | ICD-10-CM

## 2013-06-28 DIAGNOSIS — K5900 Constipation, unspecified: Secondary | ICD-10-CM

## 2013-06-28 LAB — CBC WITH DIFFERENTIAL/PLATELET
Basophils Absolute: 0 K/uL (ref 0.0–0.1)
Basophils Relative: 0 % (ref 0–1)
Basophils Relative: 0 % (ref 0–1)
Eosinophils Absolute: 0 K/uL (ref 0.0–0.7)
Eosinophils Relative: 0 % (ref 0–5)
Eosinophils Relative: 0 % (ref 0–5)
HCT: 24.4 % — ABNORMAL LOW (ref 36.0–46.0)
Hemoglobin: 11.3 g/dL — ABNORMAL LOW (ref 12.0–15.0)
Hemoglobin: 8.5 g/dL — ABNORMAL LOW (ref 12.0–15.0)
Lymphocytes Relative: 5 % — ABNORMAL LOW (ref 12–46)
Lymphocytes Relative: 8 % — ABNORMAL LOW (ref 12–46)
Lymphs Abs: 1.2 K/uL (ref 0.7–4.0)
MCH: 28.3 pg (ref 26.0–34.0)
MCH: 28.4 pg (ref 26.0–34.0)
MCHC: 34.8 g/dL (ref 30.0–36.0)
MCV: 81.3 fL (ref 78.0–100.0)
Monocytes Absolute: 1.5 K/uL — ABNORMAL HIGH (ref 0.1–1.0)
Monocytes Relative: 10 % (ref 3–12)
Monocytes Relative: 7 % (ref 3–12)
Neutro Abs: 12 K/uL — ABNORMAL HIGH (ref 1.7–7.7)
Neutrophils Relative %: 82 % — ABNORMAL HIGH (ref 43–77)
Neutrophils Relative %: 88 % — ABNORMAL HIGH (ref 43–77)
Platelets: 182 K/uL (ref 150–400)
Platelets: 232 10*3/uL (ref 150–400)
RBC: 3 MIL/uL — ABNORMAL LOW (ref 3.87–5.11)
RBC: 3.98 MIL/uL (ref 3.87–5.11)
RDW: 16.4 % — ABNORMAL HIGH (ref 11.5–15.5)
WBC: 14.4 10*3/uL — ABNORMAL HIGH (ref 4.0–10.5)
WBC: 14.7 K/uL — ABNORMAL HIGH (ref 4.0–10.5)

## 2013-06-28 LAB — COMPREHENSIVE METABOLIC PANEL
ALT: 38 U/L — ABNORMAL HIGH (ref 0–35)
AST: 32 U/L (ref 0–37)
Alkaline Phosphatase: 352 U/L — ABNORMAL HIGH (ref 39–117)
CO2: 23 mEq/L (ref 19–32)
Chloride: 91 mEq/L — ABNORMAL LOW (ref 96–112)
GFR calc non Af Amer: >90 mL/min (ref >90)
Potassium: 3.1 mEq/L — ABNORMAL LOW (ref 3.5–5.1)
Sodium: 129 mEq/L — ABNORMAL LOW (ref 135–145)
Total Bilirubin: 0.7 mg/dL (ref 0.3–1.2)

## 2013-06-28 LAB — CBC
HCT: 25.8 % — ABNORMAL LOW (ref 36.0–46.0)
Hemoglobin: 9 g/dL — ABNORMAL LOW (ref 12.0–15.0)
MCH: 28.3 pg (ref 26.0–34.0)
MCHC: 34.9 g/dL (ref 30.0–36.0)
MCV: 81.1 fL (ref 78.0–100.0)
Platelets: 168 K/uL (ref 150–400)
RBC: 3.18 MIL/uL — ABNORMAL LOW (ref 3.87–5.11)
RDW: 16.6 % — ABNORMAL HIGH (ref 11.5–15.5)
WBC: 11.2 K/uL — ABNORMAL HIGH (ref 4.0–10.5)

## 2013-06-28 LAB — MRSA PCR SCREENING: MRSA by PCR: NEGATIVE

## 2013-06-28 LAB — URINALYSIS, ROUTINE W REFLEX MICROSCOPIC
Bilirubin Urine: NEGATIVE
Ketones, ur: NEGATIVE mg/dL
Nitrite: NEGATIVE
pH: 7 (ref 5.0–8.0)

## 2013-06-28 LAB — LACTIC ACID, PLASMA: Lactic Acid, Venous: 0.9 mmol/L (ref 0.5–2.2)

## 2013-06-28 MED ORDER — ENOXAPARIN SODIUM 40 MG/0.4ML ~~LOC~~ SOLN
40.0000 mg | SUBCUTANEOUS | Status: DC
  Administered 2013-06-28 – 2013-07-01 (×4): 40 mg via SUBCUTANEOUS
  Filled 2013-06-28 (×4): qty 0.4

## 2013-06-28 MED ORDER — NOREPINEPHRINE BITARTRATE 1 MG/ML IJ SOLN
2.0000 ug/min | Freq: Once | INTRAVENOUS | Status: DC
  Administered 2013-06-28: 2 ug/min via INTRAVENOUS
  Filled 2013-06-28: qty 4

## 2013-06-28 MED ORDER — SODIUM CHLORIDE 0.9 % IV BOLUS (SEPSIS)
1000.0000 mL | Freq: Once | INTRAVENOUS | Status: AC
  Administered 2013-06-28: 1000 mL via INTRAVENOUS

## 2013-06-28 MED ORDER — IOHEXOL 300 MG/ML  SOLN
25.0000 mL | INTRAMUSCULAR | Status: AC
  Administered 2013-06-28 (×2): 25 mL via ORAL

## 2013-06-28 MED ORDER — ACETAMINOPHEN 650 MG RE SUPP: 650.0000 mg | Freq: Four times a day (QID) | RECTAL | Status: DC | PRN

## 2013-06-28 MED ORDER — VANCOMYCIN HCL IN DEXTROSE 750-5 MG/150ML-% IV SOLN
750.0000 mg | Freq: Two times a day (BID) | INTRAVENOUS | Status: DC
  Administered 2013-06-28 – 2013-06-29 (×4): 750 mg via INTRAVENOUS
  Filled 2013-06-28 (×4): qty 150

## 2013-06-28 MED ORDER — DOCUSATE SODIUM 100 MG PO CAPS
100.0000 mg | ORAL_CAPSULE | Freq: Two times a day (BID) | ORAL | Status: DC
  Administered 2013-06-28 – 2013-06-29 (×3): 100 mg via ORAL
  Filled 2013-06-28 (×4): qty 1

## 2013-06-28 MED ORDER — ONDANSETRON HCL 4 MG/2ML IJ SOLN
4.0000 mg | Freq: Four times a day (QID) | INTRAMUSCULAR | Status: DC | PRN
  Administered 2013-06-30: 4 mg via INTRAVENOUS
  Filled 2013-06-28: qty 2

## 2013-06-28 MED ORDER — MORPHINE SULFATE ER 30 MG PO TBCR
30.0000 mg | EXTENDED_RELEASE_TABLET | Freq: Two times a day (BID) | ORAL | Status: DC
  Administered 2013-06-28 – 2013-06-30 (×6): 30 mg via ORAL
  Filled 2013-06-28 (×6): qty 1

## 2013-06-28 MED ORDER — MORPHINE SULFATE 15 MG PO TABS
15.0000 mg | ORAL_TABLET | ORAL | Status: DC | PRN
  Administered 2013-06-28 – 2013-07-01 (×15): 30 mg via ORAL
  Filled 2013-06-28: qty 1
  Filled 2013-06-28: qty 2
  Filled 2013-06-28: qty 1
  Filled 2013-06-28 (×13): qty 2

## 2013-06-28 MED ORDER — IOHEXOL 300 MG/ML  SOLN
100.0000 mL | Freq: Once | INTRAMUSCULAR | Status: AC | PRN
  Administered 2013-06-28: 100 mL via INTRAVENOUS

## 2013-06-28 MED ORDER — ONDANSETRON HCL 4 MG PO TABS: 4.0000 mg | ORAL_TABLET | Freq: Four times a day (QID) | ORAL | Status: DC | PRN

## 2013-06-28 MED ORDER — ACETAMINOPHEN 325 MG PO TABS: 650.0000 mg | ORAL_TABLET | Freq: Four times a day (QID) | ORAL | Status: DC | PRN

## 2013-06-28 MED ORDER — POTASSIUM CHLORIDE 10 MEQ/100ML IV SOLN
10.0000 meq | INTRAVENOUS | Status: AC
  Administered 2013-06-28 (×2): 10 meq via INTRAVENOUS
  Filled 2013-06-28 (×2): qty 100

## 2013-06-28 MED ORDER — LIDOCAINE-PRILOCAINE 2.5-2.5 % EX CREA
1.0000 "application " | TOPICAL_CREAM | Freq: Every day | CUTANEOUS | Status: DC | PRN
  Filled 2013-06-28: qty 5

## 2013-06-28 MED ORDER — POTASSIUM CHLORIDE CRYS ER 20 MEQ PO TBCR
20.0000 meq | EXTENDED_RELEASE_TABLET | Freq: Every day | ORAL | Status: DC
  Administered 2013-06-28 – 2013-07-01 (×3): 20 meq via ORAL
  Filled 2013-06-28 (×4): qty 1

## 2013-06-28 MED ORDER — SODIUM CHLORIDE 0.9 % IJ SOLN
3.0000 mL | Freq: Two times a day (BID) | INTRAMUSCULAR | Status: DC
  Administered 2013-06-28 – 2013-06-29 (×4): 3 mL via INTRAVENOUS

## 2013-06-28 MED ORDER — DEXTROSE 5 % IV SOLN
2.0000 g | Freq: Three times a day (TID) | INTRAVENOUS | Status: DC
  Administered 2013-06-28 – 2013-06-30 (×8): 2 g via INTRAVENOUS
  Filled 2013-06-28 (×10): qty 2

## 2013-06-28 MED ORDER — ONDANSETRON HCL 4 MG/2ML IJ SOLN: 4.0000 mg | Freq: Three times a day (TID) | INTRAMUSCULAR | Status: AC | PRN

## 2013-06-28 MED ORDER — SODIUM CHLORIDE 0.9 % IV SOLN
INTRAVENOUS | Status: AC
  Administered 2013-06-28: 125 mL/h via INTRAVENOUS

## 2013-06-28 MED ORDER — NOREPINEPHRINE BITARTRATE 1 MG/ML IJ SOLN
2.0000 ug/min | INTRAVENOUS | Status: DC
  Administered 2013-06-28: 2 ug/min via INTRAVENOUS
  Filled 2013-06-28: qty 4

## 2013-06-28 MED ORDER — PANTOPRAZOLE SODIUM 40 MG PO TBEC
40.0000 mg | DELAYED_RELEASE_TABLET | Freq: Every day | ORAL | Status: DC
  Administered 2013-06-28 – 2013-07-01 (×4): 40 mg via ORAL
  Filled 2013-06-28 (×4): qty 1

## 2013-06-28 NOTE — Progress Notes (Signed)
Called to update MD Toniann Fail) of patients BP 97/51 MAP 63 post 4th liter bolus finishing. He consulted with Critical Care [facility]) who ordered levophed 4mg  to be titrated between 2-20 mcg to maintain a MAP > 55. Drip started at latest BP 107/64 MAP 77. Patient resting comfortably. Will continue to monitor.

## 2013-06-28 NOTE — H&P (Signed)
[facility] History and Physical  Bianca Logan ZOX:[id number] DOB: 03-Oct-1950 DOA: 06/27/2013  Referring physician: ER physician. PCP: Cain Saupe, MD  Specialists: Dr. Myrle Sheng. Oncologist.  Chief Complaint: Fever.  HPI: Bianca Logan is a 62 y.o. female history of metastatic comedocarcinoma and history of obstructive time is status post biliary drain placement presented to the ER because of fever. Patient's symptoms started last evening abruptly. Initially patient was feeling mildly confused but by the time patient reached ER she was alert awake oriented. In the ER patient was initially found to be hypotensive which responded to fluids. Patient otherwise denies any chest pain nausea vomiting diarrhea shortness of breath productive cough. Chest x-ray does not show anything acute. UA is pending. Patient has been empirically started on IV antibiotics for possible developing sepsis after blood cultures were obtained. On my exam patient has left upper and lower quadrant tenderness. CT abdomen and pelvis has been ordered and is pending.   Review of Systems: As presented in the history of presenting illness, rest negative.  Past Medical History  Diagnosis Date  . 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 GERD (gastroesophageal reflux disease)   . Cancer 05/02/13    cholangiocarcinoma  . Bone cancer 05/15/13    Bone mets  . Contact lens/glasses fitting     wears contacts or glasses   Past Surgical History  Procedure Laterality Date  . Cyst removed from left breast  01/1999  . Ercp N/A 05/02/2013    Procedure: ENDOSCOPIC RETROGRADE CHOLANGIOPANCREATOGRAPHY (ERCP);  Surgeon: Willis Modena, MD;  Location: [facility];  Service: Endoscopy;  Laterality: N/A;  . Biliary stent placement N/A 05/02/2013    Procedure: BILIARY STENT PLACEMENT;  Surgeon: Willis Modena, MD;  Location: [facility];  Service: Endoscopy;  Laterality: N/A;  . Eus N/A 05/02/2013    Procedure: FULL UPPER  ENDOSCOPIC ULTRASOUND (EUS) RADIAL;  Surgeon: Willis Modena, MD;  Location: [facility];  Service: Endoscopy;  Laterality: N/A;  . Breast surgery  2000    lt br cyst  . Tibia im nail insertion Left 05/28/2013    Procedure: INTRAMEDULLARY (IM) NAIL LEFT TIBIAL;  Surgeon: Velna Ochs, MD;  Location: [facility];  Service: Orthopedics;  Laterality: Left;   Social History:  reports that she quit smoking about 28 years ago. Her smoking use included Cigarettes. She started smoking about 28 years ago. She has a 18 pack-year smoking history. She has never used smokeless tobacco. She reports that she drinks about 2.4 ounces of alcohol per week. She reports that she does not use illicit drugs. Where does patient live home. Can patient participate in ADLs? Yes.  No Known Allergies  Family History:  Family History  Problem Relation Age of Onset  . Cancer Mother     Undetermined  . Heart attack Father   . Cancer Father     prostate  . Anuerysm Sister   . Obesity Brother       Prior to Admission medications   Medication Sig Start Date End Date Taking? Authorizing Provider  calcium-vitamin D (OSCAL WITH D) 500-200 MG-UNIT per tablet Take 1 tablet by mouth daily with breakfast.   Yes Historical Provider, MD  dexamethasone (DECADRON) 4 MG tablet Take 0.5 tablets (2 mg total) by mouth daily. 06/20/13  Yes Ladene Artist, MD  lidocaine-prilocaine (EMLA) cream Apply 1 application topically daily as needed (for pain on port).   Yes Historical Provider, MD  morphine (MS CONTIN) 30 MG 12 hr tablet  Take 1 tablet (30 mg total) by mouth every 12 (twelve) hours. 06/11/13  Yes Ladene Artist, MD  morphine (MSIR) 15 MG tablet Take 1-2 tablets (15-30 mg total) by mouth every 4 (four) hours as needed for severe pain. 06/20/13  Yes Ladene Artist, MD  Multiple Vitamin (MULTIVITAMIN WITH MINERALS) TABS tablet Take 1 tablet by mouth daily.   Yes Historical Provider, MD  omeprazole (PRILOSEC) 20  MG capsule Take 1 capsule (20 mg total) by mouth 2 (two) times daily. 06/20/13  Yes Ladene Artist, MD  ondansetron (ZOFRAN) 4 MG tablet Take 1 tablet (4 mg total) by mouth every 6 (six) hours as needed. 06/20/13  Yes Ladene Artist, MD  polyethylene glycol [facility] / GLYCOLAX) packet Take 17 g by mouth daily as needed for moderate constipation.   Yes Historical Provider, MD  potassium chloride SA (K-DUR,KLOR-CON) 20 MEQ tablet Take 1 tablet (20 mEq total) by mouth daily. 06/15/13  Yes Ladene Artist, MD  prochlorperazine (COMPAZINE) 10 MG tablet Take 10 mg by mouth every 6 (six) hours as needed for nausea or vomiting.   Yes Historical Provider, MD  sennosides-docusate sodium (SENOKOT-S) 8.6-50 MG tablet Take 1 tablet by mouth 2 (two) times daily.   Yes Historical Provider, MD    Physical Exam: Filed Vitals:   06/27/13 2309 06/28/13 0053 06/28/13 0142  BP: 94/57 102/73 92/51  Pulse: 140  121  Temp: 103.7 F (39.8 C)  97.7 F (36.5 C)  TempSrc: Rectal  Oral  Resp:   20  SpO2: 99%       General:  Well-developed and nourished.  Eyes: Anicteric no pallor.  ENT: No discharge from ears eyes nose mouth.  Neck: No mass felt.  Cardiovascular: S1-S2 heard.  Respiratory: No rhonchi or crepitations.  Abdomen: Soft mild tenderness in the left upper and lower quadrants. No guarding or rigidity.  Skin: No rash.  Musculoskeletal: No edema.  Psychiatric: Appears normal.  Neurologic: Alert awake oriented to time place and person. Moves all extremities.  Labs on Admission:  Basic Metabolic Panel:  Recent Labs Lab 06/27/13 2335  NA 129*  K 3.1*  CL 91*  CO2 23  GLUCOSE 111*  BUN 11  CREATININE 0.59  CALCIUM 9.7   Liver Function Tests:  Recent Labs Lab 06/27/13 2335  AST 32  ALT 38*  ALKPHOS 352*  BILITOT 0.7  PROT 6.6  ALBUMIN 2.7*   No results found for this basename: LIPASE, AMYLASE,  in the last 168 hours No results found for this basename: AMMONIA,  in the  last 168 hours CBC:  Recent Labs Lab 06/27/13 2335  WBC 14.4*  NEUTROABS 12.7*  HGB 11.3*  HCT 32.1*  MCV 80.7  PLT 232   Cardiac Enzymes: No results found for this basename: CKTOTAL, CKMB, CKMBINDEX, TROPONINI,  in the last 168 hours  BNP (last 3 results) No results found for this basename: PROBNP,  in the last 8760 hours CBG: No results found for this basename: GLUCAP,  in the last 168 hours  Radiological Exams on Admission: Dg Chest Port 1 View  06/28/2013   CLINICAL DATA:  Shortness of breath  EXAM: PORTABLE CHEST - 1 VIEW  COMPARISON:  Chest CT 05/15/2013  FINDINGS: Right IJ porta catheter, tip at the superior cavoatrial junction. Normal heart size. No edema or infiltrate. No effusion or pneumothorax. No definitive pulmonary nodule. Known osseous metastatic disease to the lateral left 2nd rib.  IMPRESSION: No active cardiopulmonary disease.  Electronically Signed   By: Tiburcio Pea M.D.   On: 06/28/2013 00:27    Assessment/Plan Principal Problem:   SIRS (systemic inflammatory response syndrome) Active Problems:   Stage IV cholangiocarcinoma   Hyponatremia   1. SIRS with impending sepsis - source is unclear most likely intra-abdominal given her abdominal tenderness. I have ordered CT abdomen and pelvis which is pending and UA is also pending. For now I have placed patient on vancomycin and cefepime. Follow blood cultures and UA and CT abdomen pelvis. Patient will be admitted to step down and I will continue with aggressive hydration and recheck lactic acid in a.m. 2. Mild anemia - probably from chemotherapy. Closely follow CBC. 3. Mild leukocytosis - probably from #1 reason. 4. Mild hyponatremia - probably from dehydration. Patient is receiving IV fluids. Closely follow intake output and metabolic panel. 5. Mild hypokalemia - replace and recheck. 6. Metastatic cholangiocarcinoma - patient received last chemotherapy last week. Further recommendations per  oncologist.  I have reviewed patient's old chart and compared patient's previous labs to the present one.    Code Status: Full code.  Family Communication: Patient's husband at the bedside.  Disposition Plan: Admit to inpatient.    Namon Villarin N. [facility] Pager [phone].  If 7PM-7AM, please contact night-coverage [email or web address] Password [email or web address] 06/28/2013, 2:17 AM

## 2013-06-28 NOTE — Progress Notes (Signed)
D/w PCCM who consulted on pt, now off [place], I will pick up 12/20  Zannie Cove [phone]

## 2013-06-28 NOTE — Consult Note (Signed)
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  She has been dealing with biliary obstruction treated with both a drain and a biliary stent since November.  For the last several weeks she has not required use of the external biliary drain and has been doing fairly well without jaundice despite receiving three sessions of gemcitabine and oxaliplatin as guided by Dr. Truett Perna.  She had her most recent dose of chemotherapy on 12/10.   On 12/18 she was feeling well until immediately after dinner when she noted the sudden onset of a chill with profound weakness and mild confusion.  The confusion resolved quickly but she came to the ED after her husband measured a fever at home and received guidance from the cancer center's phone center.  In the ED she was noted to have some mild LLQ tenderness which resolved with passing flatus.  She was given 4L of saline prior to starting levophed through her port.  She denies nausea, vomiting, diarrhea, shortness of breath, cough, or dysuria.  This morning she has no complaints and has been off of levophed since around 0930.  PAST MEDICAL HISTORY :  Past Medical History  Diagnosis Date  . 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 GERD (gastroesophageal reflux disease)   . Cancer 05/02/13    cholangiocarcinoma  . Bone cancer 05/15/13    Bone mets  . Contact  lens/glasses fitting     wears contacts or glasses   Past Surgical History  Procedure Laterality Date  . Cyst removed from left breast  01/1999  . Ercp N/A 05/02/2013    Procedure: ENDOSCOPIC RETROGRADE CHOLANGIOPANCREATOGRAPHY (ERCP);  Surgeon: Willis Modena, MD;  Location: [facility];  Service: Endoscopy;  Laterality: N/A;  . Biliary stent placement N/A 05/02/2013    Procedure: BILIARY STENT PLACEMENT;  Surgeon: Willis Modena, MD;  Location: [facility];  Service: Endoscopy;  Laterality: N/A;  . Eus N/A 05/02/2013    Procedure: FULL UPPER ENDOSCOPIC ULTRASOUND (EUS) RADIAL;  Surgeon: Willis Modena, MD;  Location: [facility];  Service: Endoscopy;  Laterality: N/A;  . Breast surgery  2000    lt br cyst  . Tibia im nail insertion Left 05/28/2013    Procedure: INTRAMEDULLARY (IM) NAIL LEFT TIBIAL;  Surgeon: Velna Ochs, MD;  Location: [facility];  Service: Orthopedics;  Laterality: Left;   Prior to Admission medications   Medication Sig Start Date End Date Taking? Authorizing Provider  calcium-vitamin D (OSCAL WITH D) 500-200 MG-UNIT per tablet Take 1 tablet by mouth daily with breakfast.   Yes Historical Provider, MD  dexamethasone (DECADRON) 4 MG tablet Take 0.5 tablets (2 mg total) by mouth daily. 06/20/13  Yes Ladene Artist, MD  lidocaine-prilocaine (EMLA) cream Apply 1 application topically daily as needed (for pain on port).   Yes Historical Provider, MD  morphine (MS CONTIN) 30 MG 12 hr tablet Take 1 tablet (  30 mg total) by mouth every 12 (twelve) hours. 06/11/13  Yes Ladene Artist, MD  morphine (MSIR) 15 MG tablet Take 1-2 tablets (15-30 mg total) by mouth every 4 (four) hours as needed for severe pain. 06/20/13  Yes Ladene Artist, MD  Multiple Vitamin (MULTIVITAMIN WITH MINERALS) TABS tablet Take 1 tablet by mouth daily.   Yes Historical Provider, MD  omeprazole (PRILOSEC) 20 MG capsule Take 1 capsule (20 mg total) by mouth 2 (two) times daily. 06/20/13   Yes Ladene Artist, MD  ondansetron (ZOFRAN) 4 MG tablet Take 1 tablet (4 mg total) by mouth every 6 (six) hours as needed. 06/20/13  Yes Ladene Artist, MD  polyethylene glycol [facility] / GLYCOLAX) packet Take 17 g by mouth daily as needed for moderate constipation.   Yes Historical Provider, MD  potassium chloride SA (K-DUR,KLOR-CON) 20 MEQ tablet Take 1 tablet (20 mEq total) by mouth daily. 06/15/13  Yes Ladene Artist, MD  prochlorperazine (COMPAZINE) 10 MG tablet Take 10 mg by mouth every 6 (six) hours as needed for nausea or vomiting.   Yes Historical Provider, MD  sennosides-docusate sodium (SENOKOT-S) 8.6-50 MG tablet Take 1 tablet by mouth 2 (two) times daily.   Yes Historical Provider, MD   No Known Allergies  FAMILY HISTORY:  Family History  Problem Relation Age of Onset  . Cancer Mother     Undetermined  . Heart attack Father   . Cancer Father     prostate  . Anuerysm Sister   . Obesity Brother    SOCIAL HISTORY:  reports that she quit smoking about 28 years ago. Her smoking use included Cigarettes. She started smoking about 28 years ago. She has a 18 pack-year smoking history. She has never used smokeless tobacco. She reports that she drinks about 2.4 ounces of alcohol per week. She reports that she does not use illicit drugs.  REVIEW OF SYSTEMS:   Gen: + fever, + chills, weight change, + fatigue, night sweats HEENT: Denies blurred vision, double vision, hearing loss, tinnitus, sinus congestion, rhinorrhea, sore throat, neck stiffness, dysphagia PULM: Denies shortness of breath, cough, sputum production, hemoptysis, wheezing CV: Denies chest pain, edema, orthopnea, paroxysmal nocturnal dyspnea, palpitations GI: Denies abdominal pain, nausea, vomiting, diarrhea, hematochezia, melena, constipation, change in bowel habits GU: Denies dysuria, hematuria, polyuria, oliguria, urethral discharge Endocrine: Denies hot or cold intolerance, polyuria, polyphagia or appetite  change Derm: Denies rash, dry skin, scaling or peeling skin change Heme: Denies easy bruising, bleeding, bleeding gums Neuro: Denies headache, numbness, weakness, slurred speech, loss of memory or consciousness   SUBJECTIVE:   VITAL SIGNS: Temp:  [97.7 F (36.5 C)-103.7 F (39.8 C)] 98.4 F (36.9 C) (12/19 0400) Pulse Rate:  [84-140] 84 (12/19 0800) Resp:  [14-21] 18 (12/19 0800) BP: (90-107)/(51-73) 103/67 mmHg (12/19 0800) SpO2:  [97 %-100 %] 98 % (12/19 0800) Weight:  [58.4 kg (128 lb 12 oz)] 58.4 kg (128 lb 12 oz) (12/19 0300) HEMODYNAMICS:   VENTILATOR SETTINGS:   INTAKE / OUTPUT: Intake/Output     12/18 0701 - 12/19 0700 12/19 0701 - 12/20 0700   I.V. (mL/kg) 65.5 (1.1)    IV Piggyback 350    Total Intake(mL/kg) 415.5 (7.1)    Urine (mL/kg/hr) 950 575 (3.9)   Total Output 950 575   Net -534.5 -575          PHYSICAL EXAMINATION: Gen: well appearing, no acute distress HEENT: NCAT, PERRL, EOMi, OP clear, neck supple without masses  PULM: CTA B CV: RRR, no mgr, no JVD AB: BS+, soft, nontender, no hsm; drain in place, mild redness around site but no swelling or drainage Ext: warm, no edema, no clubbing, no cyanosis Derm: no rash or skin breakdown Neuro: A&Ox4, CN II-XII intact, MAEW   LABS:  CBC  Recent Labs Lab 06/27/13 2335 06/28/13 0430  WBC 14.4* 14.7*  HGB 11.3* 8.5*  HCT 32.1* 24.4*  PLT 232 182   Coag's No results found for this basename: APTT, INR,  in the last 168 hours BMET  Recent Labs Lab 06/27/13 2335 06/28/13 0430  NA 129* 132*  K 3.1* 3.6  CL 91* 102  CO2 23 22  BUN 11 8  CREATININE 0.59 0.44*  GLUCOSE 111* 119*   Electrolytes  Recent Labs Lab 06/27/13 2335 06/28/13 0430  CALCIUM 9.7 7.6*   Sepsis Markers  Recent Labs Lab 06/27/13 2345 06/28/13 0430  LATICACIDVEN 4.52* 0.9   ABG No results found for this basename: PHART, PCO2ART, PO2ART,  in the last 168 hours Liver Enzymes  Recent Labs Lab 06/27/13 2335  06/28/13 0430  AST 32 24  ALT 38* 27  ALKPHOS 352* 253*  BILITOT 0.7 0.4  ALBUMIN 2.7* 1.8*   Cardiac Enzymes No results found for this basename: TROPONINI, PROBNP,  in the last 168 hours Glucose No results found for this basename: GLUCAP,  in the last 168 hours  Imaging Dg Chest Port 1 View  06/28/2013   CLINICAL DATA:  Shortness of breath  EXAM: PORTABLE CHEST - 1 VIEW  COMPARISON:  Chest CT 05/15/2013  FINDINGS: Right IJ porta catheter, tip at the superior cavoatrial junction. Normal heart size. No edema or infiltrate. No effusion or pneumothorax. No definitive pulmonary nodule. Known osseous metastatic disease to the lateral left 2nd rib.  IMPRESSION: No active cardiopulmonary disease.   Electronically Signed   By: Tiburcio Pea M.D.   On: 06/28/2013 00:27     12/18 CXR: Port in place, no infiltrate  ASSESSMENT / PLAN:  PULMONARY A:No acute issues P:   -monitor O2 saturation  CARDIOVASCULAR A: Septic shock > appears to have resolved P:  -Levophed for MAP <65 -continue IVF -tele  RENAL A:  No acute issues Hypokalemia P:   -continue KCL repletement -BMET in AM  GASTROINTESTINAL A:  Metastatic Cholangiocarcinoma treated with gemcitabine and oxaliplatin Obstructive jaundice treated successfully with biliary drain and stent,LFTs at baseline Constipation Transient LLQ pain 12/18 P:   -notified Dr. Truett Perna of admission -agree with CT abdomen now -stool softener now -advance diet later today if CT  HEMATOLOGIC A:  Anemia, worsened overnight with IVF (likely dilutional), no evidence of bleeding P:  -repeat CBC 12/19 PM -transfusion goal Hgb <7  INFECTIOUS A:  Septic shock, resolving; source is uncertain; consider biliary source given presence of drain, bacteremia (port), and diverticulitis given LLQ pain on 12/18 P:   -f/u CT abdomen -f/u cultures -continue current Abx  ENDOCRINE A:  No acute issues P:   -monitor glucose  NEUROLOGIC A:  No acute  issues P:   -monitor neuro status  Code: Full  Dispo> appears to be improving and doing well; will monitor today, f/u CT and repeat CBC; transfer to [facility] service 12/20 AM and PCCM will sign off  Yolonda Kida PCCM Pager: [phone] Cell: [phone] If no response, call [phone]  06/28/2013, 9:32 AM

## 2013-06-28 NOTE — Progress Notes (Signed)
  CARE MANAGEMENT NOTE 06/28/2013  Patient:  LOLETTA, HARPER   Account Number:  [id number]  Date Initiated:  06/28/2013  Documentation initiated by:  Jujuan Dugo  Subjective/Objective Assessment:   pt with abd pain, hx of lung ca and bone mets. hypotensive, requiring iv vasopressors.hyponatremic,poss sepsis.     Action/Plan:   lives at home with family and is normally able to care for self.  sdoes use a wheelchair for assistance   Anticipated DC Date:  07/01/2013   Anticipated DC Plan:  HOME/SELF CARE  In-house referral  NA      DC Planning Services  NA      [facility] Choice  NA   Choice offered to / List presented to:  NA   DME arranged  NA      DME agency  NA     HH arranged  NA      HH agency  NA   Status of service:  In process, will continue to follow Medicare Important Message given?  NA - LOS <3 / Initial given by admissions (If response is "NO", the following Medicare IM given date fields will be blank) Date Medicare IM given:   Date Additional Medicare IM given:    Discharge Disposition:    Per UR Regulation:  Reviewed for med. necessity/level of care/duration of stay  If discussed at Long Length of Stay Meetings, dates discussed:    Comments:  [id number]/Omare Bilotta Stark Jock, BSN, [place] [phone] Chart Reviewed for discharge and hospital needs. Discharge needs at time of review:  None present will follow for needs. Review of patient progress due on [phone].

## 2013-06-28 NOTE — ED Notes (Signed)
Pt placed on bed pan to obtain urine specimen.  Will use call light when finished.

## 2013-06-28 NOTE — Progress Notes (Signed)
ANTIBIOTIC CONSULT NOTE - INITIAL  Pharmacy Consult for Cefepime/Vancomycin Indication: Sepsis  No Known Allergies  Patient Measurements: Height: 5\' 4"  (162.6 cm) Weight: 128 lb 12 oz (58.4 kg) IBW/kg (Calculated) : 54.7   Vital Signs: Temp: 98.4 F (36.9 C) (12/19 0400) Temp src: Oral (12/19 0400) BP: 107/64 mmHg (12/19 0500) Pulse Rate: 101 (12/19 0500) Intake/Output from previous day: 12/18 0701 - 12/19 0700 In: 353 [I.V.:3; IV Piggyback:350] Out: 950 [Urine:950] Intake/Output from this shift: Total I/O In: 353 [I.V.:3; IV Piggyback:350] Out: 950 [Urine:950]  Labs:  Recent Labs  06/27/13 2335 06/28/13 0430  WBC 14.4* 14.7*  HGB 11.3* 8.5*  PLT 232 182  CREATININE 0.59 0.44*   Estimated Creatinine Clearance: 64.6 ml/min (by C-G formula based on Cr of 0.44). No results found for this basename: Rolm Gala, VANCORANDOM, GENTTROUGH, GENTPEAK, GENTRANDOM, TOBRATROUGH, TOBRAPEAK, TOBRARND, AMIKACINPEAK, AMIKACINTROU, AMIKACIN,  in the last 72 hours   Microbiology: Recent Results (from the past 720 hour(s))  TECHNOLOGIST REVIEW     Status: None   Collection Time    06/05/13  9:04 AM      Result Value Range Status   Technologist Review Metas and Myelocytes present   Final  TECHNOLOGIST REVIEW     Status: None   Collection Time    06/19/13  9:17 AM      Result Value Range Status   Technologist Review Occ Metas and Myelocytes present   Final  MRSA PCR SCREENING     Status: None   Collection Time    06/28/13  3:15 AM      Result Value Range Status   MRSA by PCR NEGATIVE  NEGATIVE Final   Comment:            The GeneXpert MRSA Assay (FDA     approved for NASAL specimens     only), is one component of a     comprehensive MRSA colonization     surveillance program. It is not     intended to diagnose MRSA     infection nor to guide or     monitor treatment for     MRSA infections.    Medical History: Past Medical History  Diagnosis Date  .  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 GERD (gastroesophageal reflux disease)   . Cancer 05/02/13    cholangiocarcinoma  . Bone cancer 05/15/13    Bone mets  . Contact lens/glasses fitting     wears contacts or glasses    Medications:  Scheduled:  . ceFEPime (MAXIPIME) IV  2 g Intravenous Q8H  . enoxaparin (LOVENOX) injection  40 mg Subcutaneous Q24H  . morphine  30 mg Oral Q12H  . pantoprazole  40 mg Oral Daily  . potassium chloride SA  20 mEq Oral Daily  . sodium chloride  3 mL Intravenous Q12H  . vancomycin  750 mg Intravenous Q12H   Infusions:  . sodium chloride 125 mL/hr (06/28/13 0530)  . norepinephrine (LEVOPHED) Adult infusion 2 mcg/min (06/28/13 0500)   Assessment: 62 yo with hx of metastatic cholangiocarcinoma admitted with fever.  Cefepime and Vancomycin for Sepsis per Rx.  Goal of Therapy:  Vancomycin trough level 15-20 mcg/ml  Plan:   Cefepime 2gm IV q8h  Vancomycin 750mg  IV q12h.  CrCl~68 (N)  F/U SCr/levels/cultures as needed  Lorenza Evangelist 06/28/2013,5:57 AM

## 2013-06-29 DIAGNOSIS — K8309 Other cholangitis: Secondary | ICD-10-CM

## 2013-06-29 DIAGNOSIS — R932 Abnormal findings on diagnostic imaging of liver and biliary tract: Secondary | ICD-10-CM | POA: Diagnosis present

## 2013-06-29 LAB — CBC WITH DIFFERENTIAL/PLATELET
Basophils Relative: 0 % (ref 0–1)
Eosinophils Absolute: 0.2 10*3/uL (ref 0.0–0.7)
Lymphocytes Relative: 12 % (ref 12–46)
Lymphs Abs: 1.4 10*3/uL (ref 0.7–4.0)
MCH: 28.6 pg (ref 26.0–34.0)
MCHC: 35 g/dL (ref 30.0–36.0)
Neutrophils Relative %: 73 % (ref 43–77)
Platelets: 179 10*3/uL (ref 150–400)
RBC: 3.15 MIL/uL — ABNORMAL LOW (ref 3.87–5.11)
WBC: 11.3 10*3/uL — ABNORMAL HIGH (ref 4.0–10.5)

## 2013-06-29 LAB — BASIC METABOLIC PANEL
Calcium: 8.4 mg/dL (ref 8.4–10.5)
GFR calc non Af Amer: >90 mL/min (ref >90)
Glucose, Bld: 91 mg/dL (ref 70–99)
Sodium: 132 mEq/L — ABNORMAL LOW (ref 135–145)

## 2013-06-29 LAB — COMPREHENSIVE METABOLIC PANEL
ALT: 27 U/L (ref 0–35)
AST: 24 U/L (ref 0–37)
BUN: 8 mg/dL (ref 6–23)
Calcium: 7.6 mg/dL — ABNORMAL LOW (ref 8.4–10.5)
GFR calc Af Amer: >90 mL/min (ref >90)
GFR calc non Af Amer: >90 mL/min (ref >90)
Potassium: 3.6 mEq/L (ref 3.5–5.1)
Sodium: 132 mEq/L — ABNORMAL LOW (ref 135–145)
Total Bilirubin: 0.4 mg/dL (ref 0.3–1.2)

## 2013-06-29 MED ORDER — SODIUM CHLORIDE 0.9 % IJ SOLN
10.0000 mL | Freq: Two times a day (BID) | INTRAMUSCULAR | Status: DC
  Administered 2013-07-01: 10 mL

## 2013-06-29 MED ORDER — PSYLLIUM 95 % PO PACK
1.0000 | PACK | Freq: Two times a day (BID) | ORAL | Status: DC
  Filled 2013-06-29 (×6): qty 1

## 2013-06-29 MED ORDER — VANCOMYCIN HCL IN DEXTROSE 1-5 GM/200ML-% IV SOLN
1000.0000 mg | Freq: Two times a day (BID) | INTRAVENOUS | Status: DC
  Administered 2013-06-29: 1000 mg via INTRAVENOUS
  Filled 2013-06-29 (×3): qty 200

## 2013-06-29 MED ORDER — MORPHINE SULFATE 15 MG PO TABS
15.0000 mg | ORAL_TABLET | Freq: Once | ORAL | Status: AC
  Administered 2013-06-29: 15 mg via ORAL
  Filled 2013-06-29: qty 1

## 2013-06-29 MED ORDER — SODIUM CHLORIDE 0.9 % IJ SOLN
10.0000 mL | INTRAMUSCULAR | Status: DC | PRN
  Administered 2013-06-29 – 2013-07-01 (×3): 10 mL

## 2013-06-29 MED ORDER — POTASSIUM CHLORIDE CRYS ER 20 MEQ PO TBCR
60.0000 meq | EXTENDED_RELEASE_TABLET | Freq: Once | ORAL | Status: AC
  Administered 2013-06-29: 60 meq via ORAL
  Filled 2013-06-29: qty 3

## 2013-06-29 MED ORDER — POTASSIUM CHLORIDE 10 MEQ/100ML IV SOLN
10.0000 meq | INTRAVENOUS | Status: AC
  Administered 2013-06-29: 10 meq via INTRAVENOUS
  Filled 2013-06-29 (×3): qty 100

## 2013-06-29 MED ORDER — POTASSIUM CHLORIDE 10 MEQ/50ML IV SOLN
INTRAVENOUS | Status: AC
  Filled 2013-06-29: qty 50

## 2013-06-29 MED ORDER — POTASSIUM CHLORIDE 10 MEQ/100ML IV SOLN
10.0000 meq | Freq: Once | INTRAVENOUS | Status: AC
  Administered 2013-06-29: 10 meq via INTRAVENOUS

## 2013-06-29 MED ORDER — SACCHAROMYCES BOULARDII 250 MG PO CAPS
250.0000 mg | ORAL_CAPSULE | Freq: Two times a day (BID) | ORAL | Status: DC
  Administered 2013-06-29 – 2013-07-01 (×4): 250 mg via ORAL
  Filled 2013-06-29 (×6): qty 1

## 2013-06-29 MED ORDER — HYDROMORPHONE HCL PF 1 MG/ML IJ SOLN
1.0000 mg | INTRAMUSCULAR | Status: DC | PRN
  Administered 2013-06-29 – 2013-07-01 (×5): 1 mg via INTRAVENOUS
  Filled 2013-06-29 (×5): qty 1

## 2013-06-29 NOTE — Progress Notes (Signed)
Pt has K+ of 3.1 midlevel paged awaiting call back.

## 2013-06-29 NOTE — Consult Note (Addendum)
KARRINE KLUTTZ  1950/11/22 [id number]  CARE TEAM:  PCP: Cain Saupe, MD  Outpatient Care Team: Patient Care Team: Cain Saupe as PCP - General (Family Medicine) Ladene Artist, MD as Consulting Physician (Oncology) Cira Rue, RN as Registered Nurse Willis Modena, MD as Consulting Physician (Gastroenterology)  Inpatient Treatment Team: Treatment Team: Attending Provider: Zannie Cove, MD; Technician: Heloise Ochoa, EMT; Consulting Physician: Ladene Artist, MD; Rounding Team: Delmer Islam, MD; Registered Nurse: Virl Cagey, RN; Consulting Physician: Bishop Limbo, MD; Registered Nurse: Bethann Goo, RN  This patient is a 62 y.o.female who presents today for surgical evaluation at the request of Dr. Jomarie Longs.   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  Caused biliary obstruction.  Not able to successfully stented by ERCP.  Therefore Interventional radiology managed with internal/external stenting.  External limb now capped off.  Patient found to have worsening abdominal pain and fevers and chills.  In shock.  Admitted.  Placed on IV antibiotics.  Improve.  Some persistent abdominal discomfort.  CT scan shows gallbladder wall and pancreatic thickening.  Surgical consultation made for possible cholecystitis.  The patient has markedly improved on antibiotics.  She is now off pressors.  She is tolerating solid food.  She is hoping to go up to the floor today.  Her husband is at the bedside.  Denies any fevers or chills.  She has some chronic right abdominal and hip pain.  That is her usual baseline.  No nausea or vomiting.  No dysphagia to solids or liquids.  They have not been draining the stent externally.  No personal nor family history of other GI/colon cancer, inflammatory bowel disease, irritable bowel syndrome, allergy such as Celiac Sprue, dietary/dairy  problems, colitis, ulcers nor gastritis.  No recent sick contacts/gastroenteritis.  No travel outside the country.  No changes in diet.    Past Medical History  Diagnosis Date  . 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 GERD (gastroesophageal reflux disease)   . Cancer 05/02/13    cholangiocarcinoma  . Bone cancer 05/15/13    Bone mets  . Contact lens/glasses fitting     wears contacts or glasses    Past Surgical History  Procedure Laterality Date  . Cyst removed from left breast  01/1999  . Ercp N/A 05/02/2013    Procedure: ENDOSCOPIC RETROGRADE CHOLANGIOPANCREATOGRAPHY (ERCP);  Surgeon: Willis Modena, MD;  Location: [facility];  Service: Endoscopy;  Laterality: N/A;  . Biliary stent placement N/A 05/02/2013    Procedure: BILIARY STENT PLACEMENT;  Surgeon: Willis Modena, MD;  Location: [facility];  Service: Endoscopy;  Laterality: N/A;  . Eus N/A 05/02/2013    Procedure: FULL UPPER ENDOSCOPIC ULTRASOUND (EUS) RADIAL;  Surgeon: Willis Modena, MD;  Location: [facility];  Service: Endoscopy;  Laterality: N/A;  . Breast surgery  2000    lt br cyst  . Tibia im nail insertion Left 05/28/2013    Procedure: INTRAMEDULLARY (IM) NAIL LEFT TIBIAL;  Surgeon: Velna Ochs, MD;  Location: [facility];  Service: Orthopedics;  Laterality: Left;    History   Social History  . Marital Status: Married    Spouse Name: N/A    Number of Children: 2  . Years of Education: N/A   Occupational History  . Not on file.   Social History Main Topics  . Smoking status: Former Smoker -- 1.50 packs/day for 12 years  Types: Cigarettes    Start date: 07/11/1984    Quit date: 04/26/1985  . Smokeless tobacco: Never Used  . Alcohol Use: 2.4 oz/week    2 Glasses of wine, 2 Cans of beer per week     Comment: social   . Drug Use: No  . Sexual Activity: Yes    Birth Control/ Protection: None   Other Topics Concern  . Not on file   Social History Narrative  . No  narrative on file    Family History  Problem Relation Age of Onset  . Cancer Mother     Undetermined  . Heart attack Father   . Cancer Father     prostate  . Anuerysm Sister   . Obesity Brother     Current Facility-Administered Medications  Medication Dose Route Frequency Provider Last Rate Last Dose  . acetaminophen (TYLENOL) tablet 650 mg  650 mg Oral Q6H PRN Eduard Clos, MD       Or  . acetaminophen (TYLENOL) suppository 650 mg  650 mg Rectal Q6H PRN Eduard Clos, MD      . ceFEPIme (MAXIPIME) 2 g in dextrose 5 % 50 mL IVPB  2 g Intravenous Q8H Vida Roller, MD 100 mL/hr at 06/29/13 0852 2 g at 06/29/13 0852  . docusate sodium (COLACE) capsule 100 mg  100 mg Oral BID Lupita Leash, MD   100 mg at 06/29/13 1000  . enoxaparin (LOVENOX) injection 40 mg  40 mg Subcutaneous Q24H Eduard Clos, MD   40 mg at 06/29/13 1001  . HYDROmorphone (DILAUDID) injection 1 mg  1 mg Intravenous Q3H PRN Zannie Cove, MD      . lidocaine-prilocaine (EMLA) cream 1 application  1 application Topical Daily PRN Eduard Clos, MD      . morphine (MS CONTIN) 12 hr tablet 30 mg  30 mg Oral Q12H Eduard Clos, MD   30 mg at 06/29/13 1001  . morphine (MSIR) tablet 15-30 mg  15-30 mg Oral Q4H PRN Eduard Clos, MD   30 mg at 06/29/13 0856  . norepinephrine (LEVOPHED) 4 mg in dextrose 5 % 250 mL infusion  2-20 mcg/min Intravenous Continuous Merwyn Katos, MD   2 mcg/min at 06/28/13 0800  . ondansetron (ZOFRAN) tablet 4 mg  4 mg Oral Q6H PRN Eduard Clos, MD       Or  . ondansetron [facility]) injection 4 mg  4 mg Intravenous Q6H PRN Eduard Clos, MD      . pantoprazole (PROTONIX) EC tablet 40 mg  40 mg Oral Daily Eduard Clos, MD   40 mg at 06/29/13 1001  . potassium chloride 10 MEQ/50ML IVPB           . potassium chloride SA (K-DUR,KLOR-CON) CR tablet 20 mEq  20 mEq Oral Daily Eduard Clos, MD   20 mEq at 06/28/13 1054  . sodium chloride  0.9 % injection 3 mL  3 mL Intravenous Q12H Eduard Clos, MD   3 mL at 06/28/13 2112  . vancomycin (VANCOCIN) IVPB 750 mg/150 ml premix  750 mg Intravenous Q12H Lorenza Evangelist, RPH   750 mg at 06/29/13 0450     No Known Allergies  ROS: Constitutional:  No fevers, chills, sweats.  Weight stable Eyes:  No vision changes, No discharge HENT:  No sore throats, nasal drainage Lymph: No neck swelling, No bruising easily Pulmonary:  No cough, productive sputum CV: No orthopnea,  PND  Patient walks 20 minutes for about 1/2 miles without difficulty.  No exertional chest/neck/shoulder/arm pain. GI: No personal nor family history of other GI/colon cancer, inflammatory bowel disease, irritable bowel syndrome, allergy such as Celiac Sprue, dietary/dairy problems, colitis, ulcers nor gastritis.  No recent sick contacts/gastroenteritis.  No travel outside the country.  No changes in diet. Renal: No UTIs, No hematuria Genital:  No drainage, bleeding, masses Musculoskeletal: No severe joint pain.  Good ROM major joints Skin:  No sores or lesions.  No rashes Heme/Lymph:  No easy bleeding.  No swollen lymph nodes Neuro: No focal weakness/numbness.  No seizures Psych: No suicidal ideation.  No hallucinations  BP 152/88  Pulse 101  Temp(Src) 98.5 F (36.9 C) (Oral)  Resp 16  Ht 5\' 4"  (1.626 m)  Wt 128 lb 12 oz (58.4 kg)  BMI 22.09 kg/m2  SpO2 99%  Physical Exam: General: Pt awake/alert/oriented x4 in no major acute distress.  Calm, smiling, conversational. Eyes: PERRL, normal EOM. Sclera nonicteric Neuro: CN II-XII intact w/o focal sensory/motor deficits. Lymph: No head/neck/groin lymphadenopathy Psych:  No delerium/psychosis/paranoia HENT: Normocephalic, Mucus membranes moist.  No thrush Neck: Supple, No tracheal deviation Chest: No pain.  Good respiratory excursion. CV:  Pulses intact.  Regular rhythm Abdomen: Soft, Nondistended.  Nontender.  No incarcerated hernias.  Stent in the left  upper quadrant.  Skin site clean.  No cellulitis.  No discomfort.  No Murphy sign.  No pain with cough or bed shake or percussion Ext:  SCDs BLE.  No significant edema.  No cyanosis Skin: No petechiae / purpurea.  No major sores Musculoskeletal: No severe joint pain.  Good ROM major joints   Results:   Labs: Results for orders placed during the hospital encounter of 06/27/13 (from the past 48 hour(s))  CBC WITH DIFFERENTIAL     Status: Abnormal   Collection Time    06/27/13 11:35 PM      Result Value Range   WBC 14.4 (*) 4.0 - 10.5 K/uL   RBC 3.98  3.87 - 5.11 MIL/uL   Hemoglobin 11.3 (*) 12.0 - 15.0 g/dL   HCT 04.5 (*) 40.9 - 81.1 %   MCV 80.7  78.0 - 100.0 fL   MCH 28.4  26.0 - 34.0 pg   MCHC 35.2  30.0 - 36.0 g/dL   RDW 91.4 (*) 78.2 - 95.6 %   Platelets 232  150 - 400 K/uL   Neutrophils Relative % 88 (*) 43 - 77 %   Lymphocytes Relative 5 (*) 12 - 46 %   Monocytes Relative 7  3 - 12 %   Eosinophils Relative 0  0 - 5 %   Basophils Relative 0  0 - 1 %   Neutro Abs 12.7 (*) 1.7 - 7.7 K/uL   Lymphs Abs 0.7  0.7 - 4.0 K/uL   Monocytes Absolute 1.0  0.1 - 1.0 K/uL   Eosinophils Absolute 0.0  0.0 - 0.7 K/uL   Basophils Absolute 0.0  0.0 - 0.1 K/uL   RBC Morphology POLYCHROMASIA PRESENT     WBC Morphology WHITE COUNT CONFIRMED ON SMEAR    COMPREHENSIVE METABOLIC PANEL     Status: Abnormal   Collection Time    06/27/13 11:35 PM      Result Value Range   Sodium 129 (*) 135 - 145 mEq/L   Potassium 3.1 (*) 3.5 - 5.1 mEq/L   Chloride 91 (*) 96 - 112 mEq/L   CO2 23  19 - 32  mEq/L   Glucose, Bld 111 (*) 70 - 99 mg/dL   BUN 11  6 - 23 mg/dL   Creatinine, Ser 1.61  0.50 - 1.10 mg/dL   Calcium 9.7  8.4 - 09.6 mg/dL   Total Protein 6.6  6.0 - 8.3 g/dL   Albumin 2.7 (*) 3.5 - 5.2 g/dL   AST 32  0 - 37 U/L   ALT 38 (*) 0 - 35 U/L   Alkaline Phosphatase 352 (*) 39 - 117 U/L   Total Bilirubin 0.7  0.3 - 1.2 mg/dL   GFR calc non Af Amer >90  >90 mL/min   GFR calc Af Amer >90  >90  mL/min   Comment: (NOTE)     The eGFR has been calculated using the CKD EPI equation.     This calculation has not been validated in all clinical situations.     eGFR's persistently <90 mL/min signify possible Chronic Kidney     Disease.  CG4 I-STAT (LACTIC ACID)     Status: Abnormal   Collection Time    06/27/13 11:45 PM      Result Value Range   Lactic Acid, Venous 4.52 (*) 0.5 - 2.2 mmol/L  URINALYSIS, ROUTINE W REFLEX MICROSCOPIC     Status: Abnormal   Collection Time    06/28/13  1:40 AM      Result Value Range   Color, Urine YELLOW  YELLOW   APPearance CLEAR  CLEAR   Specific Gravity, Urine 1.003 (*) 1.005 - 1.030   pH 7.0  5.0 - 8.0   Glucose, UA NEGATIVE  NEGATIVE mg/dL   Hgb urine dipstick NEGATIVE  NEGATIVE   Bilirubin Urine NEGATIVE  NEGATIVE   Ketones, ur NEGATIVE  NEGATIVE mg/dL   Protein, ur NEGATIVE  NEGATIVE mg/dL   Urobilinogen, UA 0.2  0.0 - 1.0 mg/dL   Nitrite NEGATIVE  NEGATIVE   Leukocytes, UA NEGATIVE  NEGATIVE   Comment: MICROSCOPIC NOT DONE ON URINES WITH NEGATIVE PROTEIN, BLOOD, LEUKOCYTES, NITRITE, OR GLUCOSE <1000 mg/dL.  MRSA PCR SCREENING     Status: None   Collection Time    06/28/13  3:15 AM      Result Value Range   MRSA by PCR NEGATIVE  NEGATIVE   Comment:            The GeneXpert MRSA Assay (FDA     approved for NASAL specimens     only), is one component of a     comprehensive MRSA colonization     surveillance program. It is not     intended to diagnose MRSA     infection nor to guide or     monitor treatment for     MRSA infections.  COMPREHENSIVE METABOLIC PANEL     Status: Abnormal   Collection Time    06/28/13  4:30 AM      Result Value Range   Sodium 132 (*) 135 - 145 mEq/L   Potassium 3.6  3.5 - 5.1 mEq/L   Chloride 102  96 - 112 mEq/L   Comment: DELTA CHECK NOTED   CO2 22  19 - 32 mEq/L   Glucose, Bld 119 (*) 70 - 99 mg/dL   BUN 8  6 - 23 mg/dL   Creatinine, Ser 0.45 (*) 0.50 - 1.10 mg/dL   Calcium 7.6 (*) 8.4 - 10.5  mg/dL   Total Protein 4.7 (*) 6.0 - 8.3 g/dL   Albumin 1.8 (*) 3.5 - 5.2 g/dL  AST 24  0 - 37 U/L   ALT 27  0 - 35 U/L   Alkaline Phosphatase 253 (*) 39 - 117 U/L   Total Bilirubin 0.4  0.3 - 1.2 mg/dL   GFR calc non Af Amer >90  >90 mL/min   GFR calc Af Amer >90  >90 mL/min   Comment: (NOTE)     The eGFR has been calculated using the CKD EPI equation.     This calculation has not been validated in all clinical situations.     eGFR's persistently <90 mL/min signify possible Chronic Kidney     Disease.  CBC WITH DIFFERENTIAL     Status: Abnormal   Collection Time    06/28/13  4:30 AM      Result Value Range   WBC 14.7 (*) 4.0 - 10.5 K/uL   RBC 3.00 (*) 3.87 - 5.11 MIL/uL   Hemoglobin 8.5 (*) 12.0 - 15.0 g/dL   Comment: REPEATED TO VERIFY     DELTA CHECK NOTED   HCT 24.4 (*) 36.0 - 46.0 %   MCV 81.3  78.0 - 100.0 fL   MCH 28.3  26.0 - 34.0 pg   MCHC 34.8  30.0 - 36.0 g/dL   RDW 45.4 (*) 09.8 - 11.9 %   Platelets 182  150 - 400 K/uL   Neutrophils Relative % 82 (*) 43 - 77 %   Lymphocytes Relative 8 (*) 12 - 46 %   Monocytes Relative 10  3 - 12 %   Eosinophils Relative 0  0 - 5 %   Basophils Relative 0  0 - 1 %   Neutro Abs 12.0 (*) 1.7 - 7.7 K/uL   Lymphs Abs 1.2  0.7 - 4.0 K/uL   Monocytes Absolute 1.5 (*) 0.1 - 1.0 K/uL   Eosinophils Absolute 0.0  0.0 - 0.7 K/uL   Basophils Absolute 0.0  0.0 - 0.1 K/uL   WBC Morphology MILD LEFT SHIFT (1-5% METAS, OCC MYELO, OCC BANDS)    LACTIC ACID, PLASMA     Status: None   Collection Time    06/28/13  4:30 AM      Result Value Range   Lactic Acid, Venous 0.9  0.5 - 2.2 mmol/L  CBC     Status: Abnormal   Collection Time    06/28/13  3:09 PM      Result Value Range   WBC 11.2 (*) 4.0 - 10.5 K/uL   RBC 3.18 (*) 3.87 - 5.11 MIL/uL   Hemoglobin 9.0 (*) 12.0 - 15.0 g/dL   HCT 14.7 (*) 82.9 - 56.2 %   MCV 81.1  78.0 - 100.0 fL   MCH 28.3  26.0 - 34.0 pg   MCHC 34.9  30.0 - 36.0 g/dL   RDW 13.0 (*) 86.5 - 78.4 %   Platelets 168   150 - 400 K/uL  BASIC METABOLIC PANEL     Status: Abnormal   Collection Time    06/29/13  4:49 AM      Result Value Range   Sodium 132 (*) 135 - 145 mEq/L   Potassium 3.1 (*) 3.5 - 5.1 mEq/L   Chloride 98  96 - 112 mEq/L   CO2 25  19 - 32 mEq/L   Glucose, Bld 91  70 - 99 mg/dL   BUN 6  6 - 23 mg/dL   Creatinine, Ser 6.96 (*) 0.50 - 1.10 mg/dL   Calcium 8.4  8.4 - 29.5 mg/dL   GFR  calc non Af Amer >90  >90 mL/min   GFR calc Af Amer >90  >90 mL/min   Comment: (NOTE)     The eGFR has been calculated using the CKD EPI equation.     This calculation has not been validated in all clinical situations.     eGFR's persistently <90 mL/min signify possible Chronic Kidney     Disease.  CBC WITH DIFFERENTIAL     Status: Abnormal   Collection Time    06/29/13  4:49 AM      Result Value Range   WBC 11.3 (*) 4.0 - 10.5 K/uL   RBC 3.15 (*) 3.87 - 5.11 MIL/uL   Hemoglobin 9.0 (*) 12.0 - 15.0 g/dL   HCT 13.0 (*) 86.5 - 78.4 %   MCV 81.6  78.0 - 100.0 fL   MCH 28.6  26.0 - 34.0 pg   MCHC 35.0  30.0 - 36.0 g/dL   RDW 69.6 (*) 29.5 - 28.4 %   Platelets 179  150 - 400 K/uL   Neutrophils Relative % 73  43 - 77 %   Neutro Abs 8.3 (*) 1.7 - 7.7 K/uL   Lymphocytes Relative 12  12 - 46 %   Lymphs Abs 1.4  0.7 - 4.0 K/uL   Monocytes Relative 13 (*) 3 - 12 %   Monocytes Absolute 1.4 (*) 0.1 - 1.0 K/uL   Eosinophils Relative 1  0 - 5 %   Eosinophils Absolute 0.2  0.0 - 0.7 K/uL   Basophils Relative 0  0 - 1 %   Basophils Absolute 0.0  0.0 - 0.1 K/uL    Imaging / Studies: Ct Abdomen Pelvis W Contrast  06/28/2013   CLINICAL DATA:  Abdominal pain  EXAM: CT ABDOMEN AND PELVIS WITH CONTRAST  TECHNIQUE: Multidetector CT imaging of the abdomen and pelvis was performed using the standard protocol following bolus administration of intravenous contrast.  CONTRAST:  OMNIPAQUE IOHEXOL 300 MG/ML  SOLN  COMPARISON:  05/15/2013  FINDINGS: Bilateral pleural effusions have developed. Associated bibasilar  dependent atelectasis.  Mixed interval change in the metastatic disease in the liver. Lesion at the dome on image 10 measures 2.8 cm and is smaller. Right lobe lesion on image 28 measures 2.7 cm and is larger. Lesion at the inferior tip of the liver on image 38 is 2.0 cm and is smaller.  Percutaneous biliary drain remains in place. There is abundant pneumobilia. The tip of the drain is coiled in the duodenum.  Several gallstones are unchanged. Pericholecystic fluid and or wall thickening has worsened. Inflammatory changes about the gallbladder have increased. Small amount of free fluid adjacent to the tip of the liver is present.  Bulky adenopathy surrounding the aorta at the SMA and celiac cast subjectively improved. Mesenteric adenopathy is not significantly changed. Stranding within the portal region obscures adenopathy. The portal vein remains patent.  Spleen, adrenal glands, and kidneys are within normal limits.  There is stranding in an about the pancreatic head worrisome for inflammatory change.  Uterus, bladder, and adnexa are within normal limits. Free fluid is layering in the pelvis.  Calcified density is now layering in the right side of the pelvic fluid. This is worrisome for a loose calcified body within the peritoneal space.  The pathologic fracture at L3 has markedly worsened with increasing retropulsion measuring up to 10 mm. Epidural tumor is suspected. The T9 bone lesion is partially imaged. New sclerotic bone lesion in a left lateral rib on image 14.  IMPRESSION: Increasing wall thickening and inflammatory change involving the pancreas head and gallbladder. An inflammatory process is not excluded.  Mixed interval change involving metastatic disease within the liver.  Worsening osseous metastatic disease. The pathologic fracture at L3 has markedly worsened with a large amount retropulsion. Epidural tumor is suspected.  There has been slight improvement in the abdominal adenopathy.  Bilateral  pleural effusions and bibasilar atelectasis.   Electronically Signed   By: Maryclare Bean M.D.   On: 06/28/2013 14:40   Ir Fluoro Guide Cv Line Right  06/14/2013   CLINICAL DATA:  Cholangiocarcinoma and need for porta cath for continued chemotherapy.  EXAM: IMPLANTED PORT A CATH PLACEMENT WITH ULTRASOUND AND FLUOROSCOPIC GUIDANCE  ANESTHESIA/SEDATION: 4.0 Mg IV Versed; 100 mcg IV Fentanyl  Total Moderate Sedation Time:  40 minutes  Additional Medications: 2 g IV Ancef. As antibiotic prophylaxis, Ancef was ordered pre-procedure and administered intravenously within one hour of incision.  FLUOROSCOPY TIME:  18 seconds.  PROCEDURE: The procedure, risks, benefits, and alternatives were explained to the patient. Questions regarding the procedure were encouraged and answered. The patient understands and consents to the procedure.  The right neck and chest were prepped with chlorhexidine in a sterile fashion, and a sterile drape was applied covering the operative field. Maximum barrier sterile technique with sterile gowns and gloves were used for the procedure. Local anesthesia was provided with 1% lidocaine and lidocaine with epinephrine.  Ultrasound was used to confirm patency of the right internal jugular vein. After creating a small venotomy incision, a 21 gauge needle was advanced into the right internal jugular vein under direct, real-time ultrasound guidance. Ultrasound image documentation was performed. After securing guidewire access, an 8 Fr dilator was placed. A J-wire was kinked to measure appropriate catheter length.  A subcutaneous port pocket was then created along the upper chest wall utilizing sharp and blunt dissection. Portable cautery was utilized. The pocket was irrigated with sterile saline.  A single lumen power injectable port was chosen for placement. The 8 Fr catheter was tunneled from the port pocket site to the venotomy incision. The port was placed in the pocket. External catheter was trimmed to  appropriate length based on guidewire measurement.  At the venotomy, an 8 Fr peel-away sheath was placed over a guidewire. The catheter was then placed through the sheath and the sheath removed. Final catheter positioning was confirmed and documented with a fluoroscopic spot image. The port was accessed with a needle and aspirated and flushed with heparinized saline. The needle was removed.  The venotomy and port pocket incisions were closed with subcutaneous 3-0 Monocryl and subcuticular 4-0 Vicryl. Dermabond was applied to both incisions.  Complications: None. No pneumothorax.  FINDINGS: After catheter placement, the tip lies at the cavoatrial junction. The catheter aspirates normally and is ready for immediate use.  IMPRESSION: Placement of single lumen port a cath via right internal jugular vein. The catheter tip lies at the cavoatrial junction. A power injectable port a cath was placed and is ready for immediate use.   Electronically Signed   By: Irish Lack M.D.   On: 06/14/2013 16:39   Ir US Guide Vasc Access Right  06/14/2013   CLINICAL DATA:  Cholangiocarcinoma and need for porta cath for continued chemotherapy.  EXAM: IMPLANTED PORT A CATH PLACEMENT WITH ULTRASOUND AND FLUOROSCOPIC GUIDANCE  ANESTHESIA/SEDATION: 4.0 Mg IV Versed; 100 mcg IV Fentanyl  Total Moderate Sedation Time:  40 minutes  Additional Medications: 2 g IV Ancef. As antibiotic  prophylaxis, Ancef was ordered pre-procedure and administered intravenously within one hour of incision.  FLUOROSCOPY TIME:  18 seconds.  PROCEDURE: The procedure, risks, benefits, and alternatives were explained to the patient. Questions regarding the procedure were encouraged and answered. The patient understands and consents to the procedure.  The right neck and chest were prepped with chlorhexidine in a sterile fashion, and a sterile drape was applied covering the operative field. Maximum barrier sterile technique with sterile gowns and gloves were used  for the procedure. Local anesthesia was provided with 1% lidocaine and lidocaine with epinephrine.  Ultrasound was used to confirm patency of the right internal jugular vein. After creating a small venotomy incision, a 21 gauge needle was advanced into the right internal jugular vein under direct, real-time ultrasound guidance. Ultrasound image documentation was performed. After securing guidewire access, an 8 Fr dilator was placed. A J-wire was kinked to measure appropriate catheter length.  A subcutaneous port pocket was then created along the upper chest wall utilizing sharp and blunt dissection. Portable cautery was utilized. The pocket was irrigated with sterile saline.  A single lumen power injectable port was chosen for placement. The 8 Fr catheter was tunneled from the port pocket site to the venotomy incision. The port was placed in the pocket. External catheter was trimmed to appropriate length based on guidewire measurement.  At the venotomy, an 8 Fr peel-away sheath was placed over a guidewire. The catheter was then placed through the sheath and the sheath removed. Final catheter positioning was confirmed and documented with a fluoroscopic spot image. The port was accessed with a needle and aspirated and flushed with heparinized saline. The needle was removed.  The venotomy and port pocket incisions were closed with subcutaneous 3-0 Monocryl and subcuticular 4-0 Vicryl. Dermabond was applied to both incisions.  Complications: None. No pneumothorax.  FINDINGS: After catheter placement, the tip lies at the cavoatrial junction. The catheter aspirates normally and is ready for immediate use.  IMPRESSION: Placement of single lumen port a cath via right internal jugular vein. The catheter tip lies at the cavoatrial junction. A power injectable port a cath was placed and is ready for immediate use.   Electronically Signed   By: Irish Lack M.D.   On: 06/14/2013 16:39   Dg Chest Port 1 View  06/28/2013    CLINICAL DATA:  Shortness of breath  EXAM: PORTABLE CHEST - 1 VIEW  COMPARISON:  Chest CT 05/15/2013  FINDINGS: Right IJ porta catheter, tip at the superior cavoatrial junction. Normal heart size. No edema or infiltrate. No effusion or pneumothorax. No definitive pulmonary nodule. Known osseous metastatic disease to the lateral left 2nd rib.  IMPRESSION: No active cardiopulmonary disease.   Electronically Signed   By: Tiburcio Pea M.D.   On: 06/28/2013 00:27    Medications / Allergies: per chart  Antibiotics: Anti-infectives   Start     Dose/Rate Route Frequency Ordered Stop   06/28/13 0400  vancomycin (VANCOCIN) IVPB 750 mg/150 ml premix     750 mg 150 mL/hr over 60 Minutes Intravenous Every 12 hours 06/28/13 0318     06/28/13 0030  ceFEPIme (MAXIPIME) 2 g in dextrose 5 % 50 mL IVPB     2 g 100 mL/hr over 30 Minutes Intravenous Every 8 hours 06/28/13 0014        Assessment  Alda Lea  62 y.o. female       Problem List:  Principal Problem:   Acute cholangitis  Active Problems:  Protein-calorie malnutrition, severe   Bone cancer   Stage IV cholangiocarcinoma   SIRS (systemic inflammatory response syndrome)   Hyponatremia   Gallbladder wall thickening in setting of cholangiocarcinoma   Fever chills and shock most likely due to cholangitis (inc WBC & LFTs now gradually improving) from chronic indwelling stent needed to palliate obstructing cholangiocarcinoma.  Improving with IV antibiotics.  Gallbladder wall thickening most likely incidental in the setting of cholangiocarcinoma.  No strong clinical evidence of cholecystitis at this time.    Plan:  Continue IV antibiotics.  I would simplified to Unasyn only as biliary etiology most likely.  Defer to medicine.  Suspect ten-day course total (starting from admission) needed.  Transition to oral antibiotics (Augmentin 875 PO BID) at discharge.  If she has recurrent symptoms, consider cholangiography through the stent with  possible exchange if needed.  I seriously doubt that she has cholecystitis at this time.  Her LFTs are not elevated.  If she has worsening abdominal pain or concerns, surgery would be a horrible option for cholecystectomy as risk of fistula and bleeding very high.  Would palliate an obstructed gallbladder with percutaneous drainage only.  She is markedly improved clinically, so I see no reason to change the current plan.  I spent some time discussing with the patient & her husband the pathophysiology of cholangiocarcinoma, reasoning for stenting, cholangitis, etc.  Questions were answered.  They feel reassured.  They wish to avoid surgery as well.  She is hoping to go home soon.  D/w Dr Jomarie Longs.   -VTE prophylaxis- SCDs, etc  -mobilize as tolerated to help recovery  WILL SIGN OFF, CALL us PRN.   Ardeth Sportsman, M.D., F.A.C.S. Gastrointestinal and Minimally Invasive Surgery [facility] [address], [address] [place], [place] [address] [phone] Main / Paging   06/29/2013   Note: This dictation was prepared with Dragon/digital dictation along with [facility] technology. Any transcriptional errors that result from this process are unintentional.

## 2013-06-29 NOTE — Progress Notes (Signed)
Report called to Diane, RN.  Patient alert and oriented. Spouse at the bedside.

## 2013-06-29 NOTE — Progress Notes (Addendum)
[facility] PROGRESS NOTE  Bianca Logan JXB:[id number] DOB: May 28, 1951 DOA: 06/27/2013 PCP: Cain Saupe, MD  Assessment/Plan: 1. 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 Anemia of chronic disease -monitor  4. Hypokalemia -replace  DVT proph: lovenox  Code Status: Full Code Family Communication: none at bedside Disposition Plan: [place] to floor   Consultants:  PCCM  Antibiotics:  Vanc/Cefepime  HPI/Subjective: Feels well, chronic Hip pain  Objective: Filed Vitals:   06/29/13 0400  BP: 121/77  Pulse: 97  Temp: 98.2 F (36.8 C)  Resp: 22    Intake/Output Summary (Last 24 hours) at 06/29/13 0755 Last data filed at 06/29/13 1308  Gross per 24 hour  Intake 3672.5 ml  Output   4925 ml  Net -1252.5 ml   Filed Weights   06/28/13 0300  Weight: 58.4 kg (128 lb 12 oz)    Exam:   General:  AAOx3  Chest: port a cath noted  Cardiovascular:S1S2/RRR  Respiratory: CTAB  Abdomen: soft, NT, BS present, biliary drain  Musculoskeletal:no edema c/c   Data Reviewed: Basic Metabolic Panel:  Recent Labs Lab 06/27/13 2335 06/28/13 0430 06/29/13 0449  NA 129* 132* 132*  K 3.1* 3.6 3.1*  CL 91* 102 98  CO2 23 22 25   GLUCOSE 111* 119* 91  BUN 11 8 6   CREATININE 0.59 0.44* 0.43*  CALCIUM 9.7 7.6* 8.4   Liver Function Tests:  Recent Labs Lab 06/27/13 2335 06/28/13 0430  AST 32 24  ALT 38* 27  ALKPHOS 352* 253*  BILITOT 0.7 0.4  PROT 6.6 4.7*  ALBUMIN 2.7* 1.8*   No results  found for this basename: LIPASE, AMYLASE,  in the last 168 hours No results found for this basename: AMMONIA,  in the last 168 hours CBC:  Recent Labs Lab 06/27/13 2335 06/28/13 0430 06/28/13 1509 06/29/13 0449  WBC 14.4* 14.7* 11.2* 11.3*  NEUTROABS 12.7* 12.0*  --  8.3*  HGB 11.3* 8.5* 9.0* 9.0*  HCT 32.1* 24.4* 25.8* 25.7*  MCV 80.7 81.3 81.1 81.6  PLT 232 182 168 179   Cardiac Enzymes: No results found for this basename: CKTOTAL, CKMB, CKMBINDEX, TROPONINI,  in the last 168 hours BNP (last 3 results) No results found for this basename: PROBNP,  in the last 8760 hours CBG: No results found for this basename: GLUCAP,  in the last 168 hours  Recent Results (from the past 240 hour(s))  TECHNOLOGIST REVIEW     Status: None   Collection Time    06/19/13  9:17 AM      Result Value Range Status   Technologist Review Occ Metas and Myelocytes present   Final  MRSA PCR SCREENING     Status: None   Collection Time    06/28/13  3:15 AM      Result Value Range Status   MRSA by PCR NEGATIVE  NEGATIVE Final   Comment:            The GeneXpert MRSA Assay (FDA     approved for NASAL specimens     only), is one component of a  comprehensive MRSA colonization     surveillance program. It is not     intended to diagnose MRSA     infection nor to guide or     monitor treatment for     MRSA infections.     Studies: Ct Abdomen Pelvis W Contrast  06/28/2013   CLINICAL DATA:  Abdominal pain  EXAM: CT ABDOMEN AND PELVIS WITH CONTRAST  TECHNIQUE: Multidetector CT imaging of the abdomen and pelvis was performed using the standard protocol following bolus administration of intravenous contrast.  CONTRAST:  OMNIPAQUE IOHEXOL 300 MG/ML  SOLN  COMPARISON:  05/15/2013  FINDINGS: Bilateral pleural effusions have developed. Associated bibasilar dependent atelectasis.  Mixed interval change in the metastatic disease in the liver. Lesion at the dome on image 10 measures 2.8 cm and is  smaller. Right lobe lesion on image 28 measures 2.7 cm and is larger. Lesion at the inferior tip of the liver on image 38 is 2.0 cm and is smaller.  Percutaneous biliary drain remains in place. There is abundant pneumobilia. The tip of the drain is coiled in the duodenum.  Several gallstones are unchanged. Pericholecystic fluid and or wall thickening has worsened. Inflammatory changes about the gallbladder have increased. Small amount of free fluid adjacent to the tip of the liver is present.  Bulky adenopathy surrounding the aorta at the SMA and celiac cast subjectively improved. Mesenteric adenopathy is not significantly changed. Stranding within the portal region obscures adenopathy. The portal vein remains patent.  Spleen, adrenal glands, and kidneys are within normal limits.  There is stranding in an about the pancreatic head worrisome for inflammatory change.  Uterus, bladder, and adnexa are within normal limits. Free fluid is layering in the pelvis.  Calcified density is now layering in the right side of the pelvic fluid. This is worrisome for a loose calcified body within the peritoneal space.  The pathologic fracture at L3 has markedly worsened with increasing retropulsion measuring up to 10 mm. Epidural tumor is suspected. The T9 bone lesion is partially imaged. New sclerotic bone lesion in a left lateral rib on image 14.  IMPRESSION: Increasing wall thickening and inflammatory change involving the pancreas head and gallbladder. An inflammatory process is not excluded.  Mixed interval change involving metastatic disease within the liver.  Worsening osseous metastatic disease. The pathologic fracture at L3 has markedly worsened with a large amount retropulsion. Epidural tumor is suspected.  There has been slight improvement in the abdominal adenopathy.  Bilateral pleural effusions and bibasilar atelectasis.   Electronically Signed   By: Maryclare Bean M.D.   On: 06/28/2013 14:40   Dg Chest Port 1  View  06/28/2013   CLINICAL DATA:  Shortness of breath  EXAM: PORTABLE CHEST - 1 VIEW  COMPARISON:  Chest CT 05/15/2013  FINDINGS: Right IJ porta catheter, tip at the superior cavoatrial junction. Normal heart size. No edema or infiltrate. No effusion or pneumothorax. No definitive pulmonary nodule. Known osseous metastatic disease to the lateral left 2nd rib.  IMPRESSION: No active cardiopulmonary disease.   Electronically Signed   By: Tiburcio Pea M.D.   On: 06/28/2013 00:27    Scheduled Meds: . ceFEPime (MAXIPIME) IV  2 g Intravenous Q8H  . docusate sodium  100 mg Oral BID  . enoxaparin (LOVENOX) injection  40 mg Subcutaneous Q24H  . morphine  30 mg Oral Q12H  . pantoprazole  40 mg Oral Daily  . potassium chloride  10 mEq Intravenous Q1 Hr x 2  .  potassium chloride      . potassium chloride SA  20 mEq Oral Daily  . potassium chloride  60 mEq Oral Once  . sodium chloride  3 mL Intravenous Q12H  . vancomycin  750 mg Intravenous Q12H   Continuous Infusions: . norepinephrine (LEVOPHED) Adult infusion Stopped (06/28/13 0900)    Principal Problem:   SIRS (systemic inflammatory response syndrome) Active Problems:   Stage IV cholangiocarcinoma   Hyponatremia    Time spent:46min    [facility]  [facility] Pager [phone]. If 7PM-7AM, please contact night-coverage at [email or web address], password [facility] 06/29/2013, 7:55 AM  LOS: 2 days

## 2013-06-29 NOTE — Progress Notes (Signed)
PHARMACIST - PHYSICIAN COMMUNICATION  CONCERNING:  Vancomycin  D#2 Cefepime 2Gm IV q8h, Vancomycin 750mg  IV q12h for septic shock, source unclear. Pt has port.  12/18 Blood cultures NGTD.  No symptoms of cholecystitis (pt has multiple gall stones), pt is immunocompromised however.  Renal function stable.    Vancomycin trough tonight = 7.8 (goal 15=20).  Subtherapeutic.     RECOMMENDATION: Increase to vancomycin 1g IV q 12 hours F/u trough at Css  Haynes Hoehn, PharmD, BCPS 06/29/2013, 5:33 PM  Pager: [phone]

## 2013-06-30 DIAGNOSIS — R932 Abnormal findings on diagnostic imaging of liver and biliary tract: Secondary | ICD-10-CM

## 2013-06-30 LAB — BASIC METABOLIC PANEL
BUN: 5 mg/dL — ABNORMAL LOW (ref 6–23)
CO2: 30 mEq/L (ref 19–32)
Calcium: 9.4 mg/dL (ref 8.4–10.5)
GFR calc non Af Amer: >90 mL/min (ref >90)
Glucose, Bld: 97 mg/dL (ref 70–99)
Sodium: 133 mEq/L — ABNORMAL LOW (ref 135–145)

## 2013-06-30 LAB — CBC
HCT: 28.8 % — ABNORMAL LOW (ref 36.0–46.0)
Hemoglobin: 9.8 g/dL — ABNORMAL LOW (ref 12.0–15.0)
MCH: 27.6 pg (ref 26.0–34.0)
MCHC: 34 g/dL (ref 30.0–36.0)
RBC: 3.55 MIL/uL — ABNORMAL LOW (ref 3.87–5.11)
RDW: 16.4 % — ABNORMAL HIGH (ref 11.5–15.5)

## 2013-06-30 MED ORDER — AMOXICILLIN-POT CLAVULANATE 875-125 MG PO TABS
1.0000 | ORAL_TABLET | Freq: Two times a day (BID) | ORAL | Status: DC
  Administered 2013-06-30 – 2013-07-01 (×3): 1 via ORAL
  Filled 2013-06-30 (×4): qty 1

## 2013-06-30 NOTE — Progress Notes (Signed)
[facility] PROGRESS NOTE  Bianca Logan BJY:[id number] DOB: 14-Dec-1950 DOA: 06/27/2013 PCP: Cain Saupe, MD  Assessment/Plan: 1. 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 Anemia of chronic disease -monitor  4. Hypokalemia -replace  DVT proph: lovenox  Ambulate/PT  Code Status: Full Code Family Communication: none at bedside Disposition Plan: home in 1-2days   Consultants:  PCCM  Antibiotics:  Vanc/Cefepime  HPI/Subjective: Feels well, chronic Hip pain  Objective: Filed Vitals:   06/30/13 0615  BP: 124/81  Pulse: 111  Temp: 97.8 F (36.6 C)  Resp: 18    Intake/Output Summary (Last 24 hours) at 06/30/13 0955 Last data filed at 06/30/13 0900  Gross per 24 hour  Intake    710 ml  Output   1925 ml  Net  -1215 ml   Filed Weights   06/28/13 0300  Weight: 58.4 kg (128 lb 12 oz)    Exam:   General:  AAOx3  Chest: port a cath noted  Cardiovascular:S1S2/RRR  Respiratory: CTAB  Abdomen: soft, NT, BS present, biliary drain  Musculoskeletal:no edema c/c   Data Reviewed: Basic Metabolic Panel:  Recent Labs Lab 06/27/13 2335 06/28/13 0430 06/29/13 0449 06/30/13 0500  NA 129* 132* 132* 133*  K 3.1* 3.6 3.1* 4.0  CL 91* 102 98 98  CO2 23 22 25 30   GLUCOSE 111* 119* 91 97   BUN 11 8 6  5*  CREATININE 0.59 0.44* 0.43* 0.41*  CALCIUM 9.7 7.6* 8.4 9.4   Liver Function Tests:  Recent Labs Lab 06/27/13 2335 06/28/13 0430  AST 32 24  ALT 38* 27  ALKPHOS 352* 253*  BILITOT 0.7 0.4  PROT 6.6 4.7*  ALBUMIN 2.7* 1.8*   No results found for this basename: LIPASE, AMYLASE,  in the last 168 hours No results found for this basename: AMMONIA,  in the last 168 hours CBC:  Recent Labs Lab 06/27/13 2335 06/28/13 0430 06/28/13 1509 06/29/13 0449 06/30/13 0500  WBC 14.4* 14.7* 11.2* 11.3* 11.6*  NEUTROABS 12.7* 12.0*  --  8.3*  --   HGB 11.3* 8.5* 9.0* 9.0* 9.8*  HCT 32.1* 24.4* 25.8* 25.7* 28.8*  MCV 80.7 81.3 81.1 81.6 81.1  PLT 232 182 168 179 230   Cardiac Enzymes: No results found for this basename: CKTOTAL, CKMB, CKMBINDEX, TROPONINI,  in the last 168 hours BNP (last 3 results) No results found for this basename: PROBNP,  in the last 8760 hours CBG: No results found for this basename: GLUCAP,  in the last 168 hours  Recent Results (from the past 240 hour(s))  CULTURE, BLOOD (ROUTINE X 2)     Status: None   Collection Time    06/27/13 11:35 PM      Result Value Range Status   Specimen Description BLOOD LEFT ANTECUBITAL   Final   Special Requests BOTTLES DRAWN AEROBIC AND ANAEROBIC 3CC   Final   Culture  Setup Time     Final   Value: 06/28/2013 04:55     Performed at [facility]   Culture     Final   Value:        BLOOD CULTURE RECEIVED NO GROWTH TO DATE CULTURE WILL BE HELD FOR 5 DAYS BEFORE ISSUING A FINAL NEGATIVE REPORT     Performed at [facility]   Report Status PENDING   Incomplete  CULTURE, BLOOD (ROUTINE X 2)     Status: None   Collection Time    06/27/13 11:52 PM      Result Value Range Status   Specimen Description BLOOD PORTA CATH   Final   Special Requests BOTTLES DRAWN AEROBIC AND ANAEROBIC 5CC   Final   Culture  Setup Time     Final   Value: 06/28/2013 04:54     Performed at [facility]    Culture     Final   Value:        BLOOD CULTURE RECEIVED NO GROWTH TO DATE CULTURE WILL BE HELD FOR 5 DAYS BEFORE ISSUING A FINAL NEGATIVE REPORT     Performed at [facility]   Report Status PENDING   Incomplete  MRSA PCR SCREENING     Status: None   Collection Time    06/28/13  3:15 AM      Result Value Range Status   MRSA by PCR NEGATIVE  NEGATIVE Final   Comment:            The GeneXpert MRSA Assay (FDA     approved for NASAL specimens     only), is one component of a     comprehensive MRSA colonization     surveillance program. It is not     intended to diagnose MRSA     infection nor to guide or     monitor treatment for     MRSA infections.     Studies: Ct Abdomen Pelvis W Contrast  06/28/2013   CLINICAL DATA:  Abdominal pain  EXAM: CT ABDOMEN AND PELVIS WITH CONTRAST  TECHNIQUE: Multidetector CT imaging of the abdomen and pelvis was performed using the standard protocol following bolus administration of intravenous contrast.  CONTRAST:  OMNIPAQUE IOHEXOL 300 MG/ML  SOLN  COMPARISON:  05/15/2013  FINDINGS: Bilateral pleural effusions have developed. Associated bibasilar dependent atelectasis.  Mixed interval change in the metastatic disease in the liver. Lesion at the dome on image 10 measures 2.8 cm and is smaller. Right lobe lesion on image 28 measures 2.7 cm and is larger. Lesion at the inferior tip of the liver on image 38 is 2.0 cm and is smaller.  Percutaneous biliary drain remains in place. There is abundant pneumobilia. The tip of the drain is coiled in the duodenum.  Several gallstones are unchanged. Pericholecystic fluid and or wall thickening has worsened. Inflammatory changes about the gallbladder have increased. Small amount of free fluid adjacent to the tip of the liver is present.  Bulky adenopathy surrounding the aorta at the SMA and celiac cast subjectively improved. Mesenteric adenopathy is not significantly changed. Stranding within the portal region  obscures adenopathy. The portal vein remains patent.  Spleen, adrenal glands, and kidneys are within normal limits.  There is stranding in an about the pancreatic head worrisome for inflammatory change.  Uterus, bladder, and adnexa are within normal limits. Free fluid is layering in the pelvis.  Calcified density is now layering in the right side of the  pelvic fluid. This is worrisome for a loose calcified body within the peritoneal space.  The pathologic fracture at L3 has markedly worsened with increasing retropulsion measuring up to 10 mm. Epidural tumor is suspected. The T9 bone lesion is partially imaged. New sclerotic bone lesion in a left lateral rib on image 14.  IMPRESSION: Increasing wall thickening and inflammatory change involving the pancreas head and gallbladder. An inflammatory process is not excluded.  Mixed interval change involving metastatic disease within the liver.  Worsening osseous metastatic disease. The pathologic fracture at L3 has markedly worsened with a large amount retropulsion. Epidural tumor is suspected.  There has been slight improvement in the abdominal adenopathy.  Bilateral pleural effusions and bibasilar atelectasis.   Electronically Signed   By: Maryclare Bean M.D.   On: 06/28/2013 14:40    Scheduled Meds: . ceFEPime (MAXIPIME) IV  2 g Intravenous Q8H  . enoxaparin (LOVENOX) injection  40 mg Subcutaneous Q24H  . morphine  30 mg Oral Q12H  . pantoprazole  40 mg Oral Daily  . potassium chloride SA  20 mEq Oral Daily  . psyllium  1 packet Oral BID  . saccharomyces boulardii  250 mg Oral BID  . sodium chloride  10-40 mL Intracatheter Q12H  . sodium chloride  3 mL Intravenous Q12H  . vancomycin  1,000 mg Intravenous Q12H   Continuous Infusions: . norepinephrine (LEVOPHED) Adult infusion Stopped (06/28/13 0900)    Principal Problem:   Acute cholangitis  Active Problems:   Protein-calorie malnutrition, severe   Bone cancer   Stage IV cholangiocarcinoma   SIRS  (systemic inflammatory response syndrome)   Hyponatremia   Gallbladder wall thickening in setting of cholangiocarcinoma    Time spent:88min    [facility]  [facility] Pager [phone]. If 7PM-7AM, please contact night-coverage at [email or web address], password [facility] 06/30/2013, 9:55 AM  LOS: 3 days

## 2013-07-01 ENCOUNTER — Other Ambulatory Visit: Payer: Self-pay | Admitting: *Deleted

## 2013-07-01 ENCOUNTER — Telehealth: Payer: Self-pay | Admitting: *Deleted

## 2013-07-01 ENCOUNTER — Encounter: Payer: Self-pay | Admitting: Radiation Oncology

## 2013-07-01 DIAGNOSIS — C7951 Secondary malignant neoplasm of bone: Secondary | ICD-10-CM

## 2013-07-01 DIAGNOSIS — C419 Malignant neoplasm of bone and articular cartilage, unspecified: Secondary | ICD-10-CM

## 2013-07-01 DIAGNOSIS — A419 Sepsis, unspecified organism: Secondary | ICD-10-CM | POA: Diagnosis present

## 2013-07-01 DIAGNOSIS — E43 Unspecified severe protein-calorie malnutrition: Secondary | ICD-10-CM

## 2013-07-01 DIAGNOSIS — C787 Secondary malignant neoplasm of liver and intrahepatic bile duct: Secondary | ICD-10-CM

## 2013-07-01 DIAGNOSIS — L989 Disorder of the skin and subcutaneous tissue, unspecified: Secondary | ICD-10-CM

## 2013-07-01 DIAGNOSIS — G893 Neoplasm related pain (acute) (chronic): Secondary | ICD-10-CM

## 2013-07-01 LAB — CBC
Hemoglobin: 9.8 g/dL — ABNORMAL LOW (ref 12.0–15.0)
MCH: 28.3 pg (ref 26.0–34.0)
MCV: 82.1 fL (ref 78.0–100.0)
Platelets: 250 10*3/uL (ref 150–400)
RBC: 3.46 MIL/uL — ABNORMAL LOW (ref 3.87–5.11)
WBC: 11 10*3/uL — ABNORMAL HIGH (ref 4.0–10.5)

## 2013-07-01 LAB — BASIC METABOLIC PANEL
BUN: 4 mg/dL — ABNORMAL LOW (ref 6–23)
CO2: 29 mEq/L (ref 19–32)
Calcium: 9.5 mg/dL (ref 8.4–10.5)
Chloride: 94 mEq/L — ABNORMAL LOW (ref 96–112)
Creatinine, Ser: 0.44 mg/dL — ABNORMAL LOW (ref 0.50–1.10)
Glucose, Bld: 95 mg/dL (ref 70–99)

## 2013-07-01 MED ORDER — MORPHINE SULFATE ER 60 MG PO TBCR: 60.0000 mg | EXTENDED_RELEASE_TABLET | Freq: Two times a day (BID) | ORAL | Status: DC

## 2013-07-01 MED ORDER — AMOXICILLIN-POT CLAVULANATE 875-125 MG PO TABS: 1.0000 | ORAL_TABLET | Freq: Two times a day (BID) | ORAL | Status: DC

## 2013-07-01 MED ORDER — MORPHINE SULFATE ER 30 MG PO TBCR
60.0000 mg | EXTENDED_RELEASE_TABLET | Freq: Two times a day (BID) | ORAL | Status: DC
  Administered 2013-07-01: 60 mg via ORAL
  Filled 2013-07-01: qty 2

## 2013-07-01 MED ORDER — HEPARIN SOD (PORK) LOCK FLUSH 100 UNIT/ML IV SOLN
500.0000 [IU] | INTRAVENOUS | Status: AC | PRN
  Administered 2013-07-01: 500 [IU]

## 2013-07-01 NOTE — Telephone Encounter (Signed)
Per Dr. Truett Perna : Yes, she would benefit from a hospital bed. OK to take a probiotic if she wishes. Continue Prilosec bid. Increase MS Contin to 60 mg twice daily (new script written for pick up).

## 2013-07-01 NOTE — Care Management Note (Signed)
    Page 1 of 2   07/01/2013     6:02:09 PM   CARE MANAGEMENT NOTE 07/01/2013  Patient:  Bianca Logan, Bianca Logan   Account Number:  [id number]  Date Initiated:  06/28/2013  Documentation initiated by:  DAVIS,RHONDA  Subjective/Objective Assessment:   pt with abd pain, hx of lung ca and bone mets. hypotensive, requiring iv vasopressors.hyponatremic,poss sepsis.     Action/Plan:   lives at home with family and is normally able to care for self.  sdoes use a wheelchair for assistance   Anticipated DC Date:  07/01/2013   Anticipated DC Plan:  HOME/SELF CARE  In-house referral  NA      DC Planning Services  NA      [facility] Choice  NA   Choice offered to / List presented to:  NA   DME arranged  NA      DME agency  NA     HH arranged  NA      HH agency  NA   Status of service:  Completed, signed off Medicare Important Message given?  NA - LOS <3 / Initial given by admissions (If response is "NO", the following Medicare IM given date fields will be blank) Date Medicare IM given:   Date Additional Medicare IM given:    Discharge Disposition:  HOME/SELF CARE  Per UR Regulation:  Reviewed for med. necessity/level of care/duration of stay  If discussed at Long Length of Stay Meetings, dates discussed:    Comments:  07/01/2013 Colleen Can BSN RN CCM CM spoke with patient and family. Current plans are for her to return to her home where family members will help care for her. She already has RW, w/c and bedside. Thinking about getting a hospital bed but not ready to get one at this time. NO HH needs.   [id number] Earlene Plater, RN, BSN, CCM [phone] Chart Reviewed for discharge and hospital needs. Discharge needs at time of review:  None present will follow for needs. Review of patient progress due on [id number].

## 2013-07-01 NOTE — Telephone Encounter (Signed)
Patient notified of MD answers to her questions. She will pick up pain med script on Wednesday when here for an appointment with Dr. Michell Heinrich. Has enough MS Contin 30 mg to double up and last till then.

## 2013-07-01 NOTE — Progress Notes (Signed)
IP PROGRESS NOTE  Subjective:   She is well-known to me with a history of metastatic cholangiocarcinoma. She was admitted on 06/28/2013 with sepsis syndrome. She now feels better. No source for infection has been clearly identified, though she is suspected to a pack cholangitis. She reports persistent pain in the right "hip "area. She is taking MSIR every 4 hours for partial relief of the pain. She completed a third cycle of gemcitabine/oxaliplatin on 06/19/2013.    Objective: Vital signs in last 24 hours: Blood pressure 119/81, pulse 115, temperature 98.3 F (36.8 C), temperature source Oral, resp. rate 18, height 5\' 4"  (1.626 m), weight 128 lb 12 oz (58.4 kg), SpO2 96.00%.  Intake/Output from previous day: 12/21 0701 - 12/22 0700 In: 270 [P.O.:120; IV Piggyback:150] Out: 2250 [Urine:2250]  Physical Exam:  HEENT: No thrush or ulcers Lungs: End expiratory rhonchi at the low posterior base bilaterally, no respiratory distress Cardiac: Regular rate and rhythm Abdomen: Soft and nontender, no hepatomegaly, biliary drain site at the upper abdomen with a gauze dressing Extremities: No leg edema Skin: Left lower extremity surgical site is without evidence of infection  Portacath/PICC-without erythema  Lab Results:  Recent Labs  06/30/13 0500 07/01/13 0432  WBC 11.6* 11.0*  HGB 9.8* 9.8*  HCT 28.8* 28.4*  PLT 230 250    BMET  Recent Labs  06/30/13 0500 07/01/13 0432  NA 133* 132*  K 4.0 3.9  CL 98 94*  CO2 30 29  GLUCOSE 97 95  BUN 5* 4*  CREATININE 0.41* 0.44*  CALCIUM 9.4 9.5    Studies/Results: No results found.  Medications: I have reviewed the patient's current medications.  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 Staging chest CT 05/15/2013 showed a 1.3 cm soft tissue mass within  the medial aspect of the right breast; 1.9 cm left supraclavicular lymph node; multiple enlarged mediastinal lymph nodes; 3 mm left upper lobe pulmonary nodule. Repeat CT abdomen and pelvis 05/15/2013 showed interval increase in the size of previously visualized lesions throughout the liver and multiple additional new lesions; bulky retroperitoneal lymphadenopathy was stable; extensive porta hepatis lymphadenopathy was redemonstrated; 7 mm soft tissue nodule in the subcutaneous fat of the anterior abdominal wall; 2.5 cm calcified lesion within the left hemipelvis; interval development of a pathologic compression fracture of the L3 vertebral body with extension of soft tissue components into the canal which was narrowed approximately 50%; metastatic involvement of the T9 vertebral body with small amount of extension into the vertebral canal; small amount of abnormal enhancing soft tissue at T7 with mild narrowing of the canal; destructive 2.6 cm lesion involving the lateral aspect of the left third rib. Palliative radiation to T9 and L3 on 05/21/2013  Cycle 1 gemcitabine/oxaliplatin on 05/22/2013  Cycle 2 gemcitabine/oxaliplatin on 06/05/2013 Cycle 3 gemcitabine/oxaliplatin on 06/19/2013 2. Obstructive jaundice secondary to #1 status post placement of a percutaneous biliary drain on 05/02/2013. Cholangiogram 05/16/2013 showed the existing left internal/external biliary drain was well positioned. The biliary system was decompressed. The internal/external biliary drain was exchanged. The jaundice has resolved. 3. Pain secondary to bone metastases involving the lumbar spine, retroperitoneal adenopathy. The pain has improved. She continues to have pain at the right "hip ". She is taking MS Contin and MSIR. The pain persist. I will adjust the narcotic regimen. 4. Anorexia/weight loss secondary to #1. Improved appetite. 5. Subcutaneous lesion right scalp. Question lymph  node.       6.   Pain/tenderness at the left  lower tibia-lytic lesion confirmed on a plain x-ray 05/24/2013, status post an intramedullary nail on 05/28/2013, radiation on 06/12/2013        7.   admission with sepsis syndrome 06/28/2013-cultures negative, likely cholangitis    She appears well today, though she continues to have pain at the right hip. The pain is likely "radicular "from tumor at the lumbar spine. I will increase the MS Contin dose and she will continue MSIR for breakthrough pain. She feels that she would benefit from a home hospital bed.  She can complete outpatient antibiotics as directed by the medicine service.   Initiate will return for an office visit and cycle 4 gemcitabine/oxaliplatin on 07/08/2013. I will review the CT from 06/28/2013 to judge the response to chemotherapy.   Please call oncology as needed.      LOS: 4 days   Bianca Logan  07/01/2013, 9:05 AM

## 2013-07-01 NOTE — Progress Notes (Signed)
Ordered DME referral with [facility] for hospital bed at home and also faxed order to [facility] per MD order.

## 2013-07-01 NOTE — Telephone Encounter (Signed)
Received phone call from patient stating she had been discharged and had the following questions for Dr. Truett Perna:  1. Can she have a hospital bed at home? 2. Can she take probiotics? 3. Does she need to stay on Prilosec? 4. She stated she does not have enough MS Contin.  She requested a refill.  She wanted to confirm her current dose of MS Contin.  Above questions to be sent to Dr. Kalman Drape nurse. 2.

## 2013-07-01 NOTE — Progress Notes (Signed)
Pt for discharge home today. Port to R chest flushed & deaccessed per IV team. Discharge instructions & Rx given with verbalized understanding. Family at bedside to assist with d/c. Denies any discomforts at this time. NT assisted pt to main entrance via wheelchair.

## 2013-07-01 NOTE — Progress Notes (Signed)
Patient d/c to home with instructions given in presence pf spouse and daughter. Voiced readiness to go home. Dilaudid 1mg  given IV prior d/c home as requested for rt hip pain with effectiveness voiced. Weak on rt side. Has walker at home with family assist.

## 2013-07-03 ENCOUNTER — Ambulatory Visit
Admission: RE | Admit: 2013-07-03 | Payer: BC Managed Care – PPO | Source: Ambulatory Visit | Admitting: Radiation Oncology

## 2013-07-03 HISTORY — DX: Personal history of irradiation: Z92.3

## 2013-07-04 LAB — CULTURE, BLOOD (ROUTINE X 2): Culture: NO GROWTH

## 2013-07-04 NOTE — Discharge Summary (Signed)
Physician Discharge Summary  Bianca Logan ZOX:[id number] DOB: 01-19-1951 DOA: 06/27/2013  PCP: Cain Saupe, MD  Admit date: 06/27/2013 Discharge date: 07/01/2013  Time spent:45 minutes  Recommendations for Outpatient Follow-up:  1. Dr.Sherril in 1week  Discharge Diagnoses:    Sepsis   Probable transient cholangitis   Protein-calorie malnutrition, severe   Bone cancer   Stage IV cholangiocarcinoma   SIRS (systemic inflammatory response syndrome)   Hyponatremia   Gallbladder wall thickening in setting of cholangiocarcinoma   Sepsis   Discharge Condition:stable  Diet recommendation: regular  Filed Weights   06/28/13 0300  Weight: 58.4 kg (128 lb 12 oz)    History of present illness:  Chief Complaint: Fever.  HPI: Bianca Logan is a 62 y.o. female history of metastatic comedocarcinoma and history of obstructive time is status post biliary drain placement presented to the ER because of fever. Patient's symptoms started last evening abruptly. Initially patient was feeling mildly confused but by the time patient reached ER she was alert awake oriented. In the ER patient was initially found to be hypotensive which responded to fluids. Patient otherwise denies any chest pain nausea vomiting diarrhea shortness of breath productive cough. Chest x-ray does not show anything acute. UA is pending. Patient has been empirically started on IV antibiotics for possible developing sepsis after blood cultures were obtained. On my exam patient has left upper and lower quadrant tenderness. CT abdomen and pelvis has been ordered and is pending.   Hospital Course:  1. Severe sepsis with transient shock -improved  -briefly required pressors for couple of hours on 12/19  -source unclear, suspect transient bile duct obstruction due to sludge/tumor etc  -LFTs and Bili now improved back to baseline  -CT with multiple gall stones and increased pericholecystic fluid and GB wall thickening  -NO  symptoms of cholecystitis but given immunocompromised state this could be masked And hence evaluated by dr.Gross, appreciate input, no intervention recommended at this time -Blood Cx negative so far  -she is anxious to go home  -stopped IV Abx and transitioned to PO augmentin yesterday, stable continue this for days   2. Metastatic cholangiocarcinoma  -on chemo  -Bony mets with chronic pain on narcotics  -Dr.Sherril aware of admission   3. Anemia of chronic disease  -stable  4. Hypokalemia  -replaced  Consultations:  Surgery  Onc  Discharge Exam: Filed Vitals:   07/01/13 0500  BP: 119/81  Pulse: 115  Temp: 98.3 F (36.8 C)  Resp: 18    General: AAOx3 Cardiovascular: S!S2/RRR Respiratory:CTAB  Discharge Instructions  Discharge Orders   Future Appointments Provider Department Dept Phone   07/08/2013 10:30 AM [facility] MEDICAL ONCOLOGY [phone]   07/08/2013 11:00 AM Ladene Artist, MD [facility] MEDICAL ONCOLOGY [phone]   07/08/2013 12:15 PM [facility] MEDICAL ONCOLOGY [phone]   07/08/2013 2:30 PM Anabel Bene, RD [facility] MEDICAL ONCOLOGY [phone]   07/10/2013 1:00 PM Lurline Hare, MD [facility] RADIATION ONCOLOGY [phone]   07/22/2013 11:30 AM [facility] MEDICAL ONCOLOGY [phone]   07/22/2013 12:00 PM Ladene Artist, MD [facility] MEDICAL ONCOLOGY [phone]   07/22/2013 1:00 PM [facility] [place] MEDICAL ONCOLOGY [phone]   07/22/2013 2:30 PM Anabel Bene, RD [facility] MEDICAL ONCOLOGY [phone]   Future Orders Complete By Expires   Diet general  As directed    Increase activity slowly  As directed        Medication List    STOP taking these medications       dexamethasone 4 MG tablet  Commonly known as:  DECADRON      morphine 15 MG tablet  Commonly known as:  MSIR     morphine 30 MG 12 hr tablet  Commonly known as:  MS CONTIN      TAKE these medications       amoxicillin-clavulanate 875-125 MG per tablet  Commonly known as:  AUGMENTIN  Take 1 tablet by mouth every 12 (twelve) hours. For 10days     calcium-vitamin D 500-200 MG-UNIT per tablet  Commonly known as:  OSCAL WITH D  Take 1 tablet by mouth daily with breakfast.     lidocaine-prilocaine cream  Commonly known as:  EMLA  Apply 1 application topically daily as needed (for pain on port).     multivitamin with minerals Tabs tablet  Take 1 tablet by mouth daily.     omeprazole 20 MG capsule  Commonly known as:  PRILOSEC  Take 1 capsule (20 mg total) by mouth 2 (two) times daily.     ondansetron 4 MG tablet  Commonly known as:  ZOFRAN  Take 1 tablet (4 mg total) by mouth every 6 (six) hours as needed.     polyethylene glycol packet  Commonly known as:  MIRALAX / GLYCOLAX  Take 17 g by mouth daily as needed for moderate constipation.     potassium chloride SA 20 MEQ tablet  Commonly known as:  K-DUR,KLOR-CON  Take 1 tablet (20 mEq total) by mouth daily.     prochlorperazine 10 MG tablet  Commonly known as:  COMPAZINE  Take 10 mg by mouth every 6 (six) hours as needed for nausea or vomiting.     sennosides-docusate sodium 8.6-50 MG tablet  Commonly known as:  SENOKOT-S  Take 1 tablet by mouth 2 (two) times daily.       No Known Allergies     Follow-up Information   Follow up with Thornton Papas, MD On 07/08/2013.   Specialty:  Oncology   Contact information:   [address] [place] [address] [phone]        The results of significant diagnostics from this hospitalization (including imaging, microbiology, ancillary and laboratory) are listed below for reference.    Significant Diagnostic Studies: Ct Abdomen Pelvis W Contrast  06/28/2013   CLINICAL DATA:  Abdominal pain  EXAM: CT ABDOMEN AND  PELVIS WITH CONTRAST  TECHNIQUE: Multidetector CT imaging of the abdomen and pelvis was performed using the standard protocol following bolus administration of intravenous contrast.  CONTRAST:  OMNIPAQUE IOHEXOL 300 MG/ML  SOLN  COMPARISON:  05/15/2013  FINDINGS: Bilateral pleural effusions have developed. Associated bibasilar dependent atelectasis.  Mixed interval change in the metastatic disease in the liver. Lesion at the dome on image 10 measures 2.8 cm and is smaller. Right lobe lesion on image 28 measures 2.7 cm and is larger. Lesion at the inferior tip of the liver on image 38 is 2.0 cm and is smaller.  Percutaneous biliary drain remains in place. There is abundant pneumobilia. The tip of the drain is coiled in the duodenum.  Several gallstones are unchanged. Pericholecystic fluid and or wall thickening has worsened. Inflammatory changes about the gallbladder have increased. Small amount of free fluid adjacent to the tip of the liver is present.  Bulky adenopathy  surrounding the aorta at the SMA and celiac cast subjectively improved. Mesenteric adenopathy is not significantly changed. Stranding within the portal region obscures adenopathy. The portal vein remains patent.  Spleen, adrenal glands, and kidneys are within normal limits.  There is stranding in an about the pancreatic head worrisome for inflammatory change.  Uterus, bladder, and adnexa are within normal limits. Free fluid is layering in the pelvis.  Calcified density is now layering in the right side of the pelvic fluid. This is worrisome for a loose calcified body within the peritoneal space.  The pathologic fracture at L3 has markedly worsened with increasing retropulsion measuring up to 10 mm. Epidural tumor is suspected. The T9 bone lesion is partially imaged. New sclerotic bone lesion in a left lateral rib on image 14.  IMPRESSION: Increasing wall thickening and inflammatory change involving the pancreas head and gallbladder. An  inflammatory process is not excluded.  Mixed interval change involving metastatic disease within the liver.  Worsening osseous metastatic disease. The pathologic fracture at L3 has markedly worsened with a large amount retropulsion. Epidural tumor is suspected.  There has been slight improvement in the abdominal adenopathy.  Bilateral pleural effusions and bibasilar atelectasis.   Electronically Signed   By: Maryclare Bean M.D.   On: 06/28/2013 14:40   Ir Fluoro Guide Cv Line Right  06/14/2013   CLINICAL DATA:  Cholangiocarcinoma and need for porta cath for continued chemotherapy.  EXAM: IMPLANTED PORT A CATH PLACEMENT WITH ULTRASOUND AND FLUOROSCOPIC GUIDANCE  ANESTHESIA/SEDATION: 4.0 Mg IV Versed; 100 mcg IV Fentanyl  Total Moderate Sedation Time:  40 minutes  Additional Medications: 2 g IV Ancef. As antibiotic prophylaxis, Ancef was ordered pre-procedure and administered intravenously within one hour of incision.  FLUOROSCOPY TIME:  18 seconds.  PROCEDURE: The procedure, risks, benefits, and alternatives were explained to the patient. Questions regarding the procedure were encouraged and answered. The patient understands and consents to the procedure.  The right neck and chest were prepped with chlorhexidine in a sterile fashion, and a sterile drape was applied covering the operative field. Maximum barrier sterile technique with sterile gowns and gloves were used for the procedure. Local anesthesia was provided with 1% lidocaine and lidocaine with epinephrine.  Ultrasound was used to confirm patency of the right internal jugular vein. After creating a small venotomy incision, a 21 gauge needle was advanced into the right internal jugular vein under direct, real-time ultrasound guidance. Ultrasound image documentation was performed. After securing guidewire access, an 8 Fr dilator was placed. A J-wire was kinked to measure appropriate catheter length.  A subcutaneous port pocket was then created along the upper  chest wall utilizing sharp and blunt dissection. Portable cautery was utilized. The pocket was irrigated with sterile saline.  A single lumen power injectable port was chosen for placement. The 8 Fr catheter was tunneled from the port pocket site to the venotomy incision. The port was placed in the pocket. External catheter was trimmed to appropriate length based on guidewire measurement.  At the venotomy, an 8 Fr peel-away sheath was placed over a guidewire. The catheter was then placed through the sheath and the sheath removed. Final catheter positioning was confirmed and documented with a fluoroscopic spot image. The port was accessed with a needle and aspirated and flushed with heparinized saline. The needle was removed.  The venotomy and port pocket incisions were closed with subcutaneous 3-0 Monocryl and subcuticular 4-0 Vicryl. Dermabond was applied to both incisions.  Complications: None. No pneumothorax.  FINDINGS: After catheter placement, the tip lies at the cavoatrial junction. The catheter aspirates normally and is ready for immediate use.  IMPRESSION: Placement of single lumen port a cath via right internal jugular vein. The catheter tip lies at the cavoatrial junction. A power injectable port a cath was placed and is ready for immediate use.   Electronically Signed   By: Irish Lack M.D.   On: 06/14/2013 16:39   Ir US Guide Vasc Access Right  06/14/2013   CLINICAL DATA:  Cholangiocarcinoma and need for porta cath for continued chemotherapy.  EXAM: IMPLANTED PORT A CATH PLACEMENT WITH ULTRASOUND AND FLUOROSCOPIC GUIDANCE  ANESTHESIA/SEDATION: 4.0 Mg IV Versed; 100 mcg IV Fentanyl  Total Moderate Sedation Time:  40 minutes  Additional Medications: 2 g IV Ancef. As antibiotic prophylaxis, Ancef was ordered pre-procedure and administered intravenously within one hour of incision.  FLUOROSCOPY TIME:  18 seconds.  PROCEDURE: The procedure, risks, benefits, and alternatives were explained to the  patient. Questions regarding the procedure were encouraged and answered. The patient understands and consents to the procedure.  The right neck and chest were prepped with chlorhexidine in a sterile fashion, and a sterile drape was applied covering the operative field. Maximum barrier sterile technique with sterile gowns and gloves were used for the procedure. Local anesthesia was provided with 1% lidocaine and lidocaine with epinephrine.  Ultrasound was used to confirm patency of the right internal jugular vein. After creating a small venotomy incision, a 21 gauge needle was advanced into the right internal jugular vein under direct, real-time ultrasound guidance. Ultrasound image documentation was performed. After securing guidewire access, an 8 Fr dilator was placed. A J-wire was kinked to measure appropriate catheter length.  A subcutaneous port pocket was then created along the upper chest wall utilizing sharp and blunt dissection. Portable cautery was utilized. The pocket was irrigated with sterile saline.  A single lumen power injectable port was chosen for placement. The 8 Fr catheter was tunneled from the port pocket site to the venotomy incision. The port was placed in the pocket. External catheter was trimmed to appropriate length based on guidewire measurement.  At the venotomy, an 8 Fr peel-away sheath was placed over a guidewire. The catheter was then placed through the sheath and the sheath removed. Final catheter positioning was confirmed and documented with a fluoroscopic spot image. The port was accessed with a needle and aspirated and flushed with heparinized saline. The needle was removed.  The venotomy and port pocket incisions were closed with subcutaneous 3-0 Monocryl and subcuticular 4-0 Vicryl. Dermabond was applied to both incisions.  Complications: None. No pneumothorax.  FINDINGS: After catheter placement, the tip lies at the cavoatrial junction. The catheter aspirates normally and is  ready for immediate use.  IMPRESSION: Placement of single lumen port a cath via right internal jugular vein. The catheter tip lies at the cavoatrial junction. A power injectable port a cath was placed and is ready for immediate use.   Electronically Signed   By: Irish Lack M.D.   On: 06/14/2013 16:39   Dg Chest Port 1 View  06/28/2013   CLINICAL DATA:  Shortness of breath  EXAM: PORTABLE CHEST - 1 VIEW  COMPARISON:  Chest CT 05/15/2013  FINDINGS: Right IJ porta catheter, tip at the superior cavoatrial junction. Normal heart size. No edema or infiltrate. No effusion or pneumothorax. No definitive pulmonary nodule. Known osseous metastatic disease to the lateral left 2nd rib.  IMPRESSION: No active cardiopulmonary disease.  Electronically Signed   By: Tiburcio Pea M.D.   On: 06/28/2013 00:27    Microbiology: Recent Results (from the past 240 hour(s))  CULTURE, BLOOD (ROUTINE X 2)     Status: None   Collection Time    06/27/13 11:35 PM      Result Value Range Status   Specimen Description BLOOD LEFT ANTECUBITAL   Final   Special Requests BOTTLES DRAWN AEROBIC AND ANAEROBIC 3CC   Final   Culture  Setup Time     Final   Value: 06/28/2013 04:55     Performed at [facility]   Culture     Final   Value: NO GROWTH 5 DAYS     Performed at [facility]   Report Status 07/04/2013 FINAL   Final  CULTURE, BLOOD (ROUTINE X 2)     Status: None   Collection Time    06/27/13 11:52 PM      Result Value Range Status   Specimen Description BLOOD PORTA CATH   Final   Special Requests BOTTLES DRAWN AEROBIC AND ANAEROBIC 5CC   Final   Culture  Setup Time     Final   Value: 06/28/2013 04:54     Performed at [facility]   Culture     Final   Value: NO GROWTH 5 DAYS     Performed at [facility]   Report Status 07/04/2013 FINAL   Final  MRSA PCR SCREENING     Status: None   Collection Time    06/28/13  3:15 AM      Result Value Range Status   MRSA by PCR  NEGATIVE  NEGATIVE Final   Comment:            The GeneXpert MRSA Assay (FDA     approved for NASAL specimens     only), is one component of a     comprehensive MRSA colonization     surveillance program. It is not     intended to diagnose MRSA     infection nor to guide or     monitor treatment for     MRSA infections.     Labs: Basic Metabolic Panel:  Recent Labs Lab 06/27/13 2335 06/28/13 0430 06/29/13 0449 06/30/13 0500 07/01/13 0432  NA 129* 132* 132* 133* 132*  K 3.1* 3.6 3.1* 4.0 3.9  CL 91* 102 98 98 94*  CO2 23 22 25 30 29   GLUCOSE 111* 119* 91 97 95  BUN 11 8 6  5* 4*  CREATININE 0.59 0.44* 0.43* 0.41* 0.44*  CALCIUM 9.7 7.6* 8.4 9.4 9.5   Liver Function Tests:  Recent Labs Lab 06/27/13 2335 06/28/13 0430  AST 32 24  ALT 38* 27  ALKPHOS 352* 253*  BILITOT 0.7 0.4  PROT 6.6 4.7*  ALBUMIN 2.7* 1.8*   No results found for this basename: LIPASE, AMYLASE,  in the last 168 hours No results found for this basename: AMMONIA,  in the last 168 hours CBC:  Recent Labs Lab 06/27/13 2335 06/28/13 0430 06/28/13 1509 06/29/13 0449 06/30/13 0500 07/01/13 0432  WBC 14.4* 14.7* 11.2* 11.3* 11.6* 11.0*  NEUTROABS 12.7* 12.0*  --  8.3*  --   --   HGB 11.3* 8.5* 9.0* 9.0* 9.8* 9.8*  HCT 32.1* 24.4* 25.8* 25.7* 28.8* 28.4*  MCV 80.7 81.3 81.1 81.6 81.1 82.1  PLT 232 182 168 179 230 250   Cardiac Enzymes: No results found for this basename: CKTOTAL, CKMB, CKMBINDEX,  TROPONINI,  in the last 168 hours BNP: BNP (last 3 results) No results found for this basename: PROBNP,  in the last 8760 hours CBG: No results found for this basename: GLUCAP,  in the last 168 hours     Signed:  Anatalia Kronk  [facility] 07/04/2013, 1:02 PM

## 2013-07-07 ENCOUNTER — Other Ambulatory Visit: Payer: Self-pay | Admitting: Oncology

## 2013-07-08 ENCOUNTER — Telehealth: Payer: Self-pay | Admitting: Oncology

## 2013-07-08 ENCOUNTER — Ambulatory Visit (HOSPITAL_BASED_OUTPATIENT_CLINIC_OR_DEPARTMENT_OTHER): Payer: BC Managed Care – PPO | Admitting: Oncology

## 2013-07-08 ENCOUNTER — Other Ambulatory Visit: Payer: Self-pay | Admitting: *Deleted

## 2013-07-08 ENCOUNTER — Ambulatory Visit (HOSPITAL_BASED_OUTPATIENT_CLINIC_OR_DEPARTMENT_OTHER): Payer: BC Managed Care – PPO

## 2013-07-08 ENCOUNTER — Encounter: Payer: Self-pay | Admitting: Oncology

## 2013-07-08 ENCOUNTER — Ambulatory Visit: Payer: BC Managed Care – PPO | Admitting: Nutrition

## 2013-07-08 ENCOUNTER — Other Ambulatory Visit: Payer: Self-pay | Admitting: Radiology

## 2013-07-08 ENCOUNTER — Other Ambulatory Visit: Payer: Self-pay | Admitting: Oncology

## 2013-07-08 ENCOUNTER — Other Ambulatory Visit (HOSPITAL_BASED_OUTPATIENT_CLINIC_OR_DEPARTMENT_OTHER): Payer: BC Managed Care – PPO

## 2013-07-08 ENCOUNTER — Other Ambulatory Visit: Payer: Self-pay | Admitting: Nurse Practitioner

## 2013-07-08 VITALS — BP 143/81 | HR 122 | Resp 16

## 2013-07-08 VITALS — BP 127/74 | HR 121 | Resp 18 | Ht 64.0 in

## 2013-07-08 DIAGNOSIS — C786 Secondary malignant neoplasm of retroperitoneum and peritoneum: Secondary | ICD-10-CM

## 2013-07-08 DIAGNOSIS — C787 Secondary malignant neoplasm of liver and intrahepatic bile duct: Secondary | ICD-10-CM

## 2013-07-08 DIAGNOSIS — C7951 Secondary malignant neoplasm of bone: Secondary | ICD-10-CM

## 2013-07-08 DIAGNOSIS — R634 Abnormal weight loss: Secondary | ICD-10-CM

## 2013-07-08 DIAGNOSIS — G893 Neoplasm related pain (acute) (chronic): Secondary | ICD-10-CM

## 2013-07-08 DIAGNOSIS — C221 Intrahepatic bile duct carcinoma: Secondary | ICD-10-CM

## 2013-07-08 DIAGNOSIS — M545 Low back pain: Secondary | ICD-10-CM

## 2013-07-08 DIAGNOSIS — M25559 Pain in unspecified hip: Secondary | ICD-10-CM

## 2013-07-08 DIAGNOSIS — Z5111 Encounter for antineoplastic chemotherapy: Secondary | ICD-10-CM

## 2013-07-08 DIAGNOSIS — R609 Edema, unspecified: Secondary | ICD-10-CM

## 2013-07-08 DIAGNOSIS — M899 Disorder of bone, unspecified: Secondary | ICD-10-CM

## 2013-07-08 HISTORY — DX: Hypercalcemia: E83.52

## 2013-07-08 LAB — COMPREHENSIVE METABOLIC PANEL (CC13)
ALT: 101 U/L — ABNORMAL HIGH (ref 0–55)
Albumin: 2.2 g/dL — ABNORMAL LOW (ref 3.5–5.0)
Alkaline Phosphatase: 571 U/L — ABNORMAL HIGH (ref 40–150)
Glucose: 140 mg/dl (ref 70–140)
Sodium: 130 mEq/L — ABNORMAL LOW (ref 136–145)
Total Bilirubin: 1.1 mg/dL (ref 0.20–1.20)
Total Protein: 6.5 g/dL (ref 6.4–8.3)

## 2013-07-08 LAB — CBC WITH DIFFERENTIAL/PLATELET
BASO%: 0.4 % (ref 0.0–2.0)
EOS%: 0.2 % (ref 0.0–7.0)
LYMPH%: 7.3 % — ABNORMAL LOW (ref 14.0–49.7)
MCH: 28.3 pg (ref 25.1–34.0)
MCHC: 33.2 g/dL (ref 31.5–36.0)
MCV: 85.1 fL (ref 79.5–101.0)
MONO%: 10.4 % (ref 0.0–14.0)
Platelets: 550 10*3/uL — ABNORMAL HIGH (ref 145–400)
RBC: 4.1 10*6/uL (ref 3.70–5.45)

## 2013-07-08 MED ORDER — SODIUM CHLORIDE 0.9 % IV SOLN
1000.0000 mL | Freq: Once | INTRAVENOUS | Status: AC
  Administered 2013-07-08: 1000 mL via INTRAVENOUS

## 2013-07-08 MED ORDER — ONDANSETRON 8 MG/50ML IVPB (CHCC)
8.0000 mg | Freq: Once | INTRAVENOUS | Status: AC
  Administered 2013-07-08: 8 mg via INTRAVENOUS

## 2013-07-08 MED ORDER — ALTEPLASE 2 MG IJ SOLR
2.0000 mg | Freq: Once | INTRAMUSCULAR | Status: DC | PRN
  Filled 2013-07-08: qty 2

## 2013-07-08 MED ORDER — ZOLEDRONIC ACID 4 MG/100ML IV SOLN
4.0000 mg | Freq: Once | INTRAVENOUS | Status: AC
  Administered 2013-07-08: 4 mg via INTRAVENOUS
  Filled 2013-07-08: qty 100

## 2013-07-08 MED ORDER — DEXAMETHASONE SODIUM PHOSPHATE 10 MG/ML IJ SOLN
10.0000 mg | Freq: Once | INTRAMUSCULAR | Status: AC
  Administered 2013-07-08: 10 mg via INTRAVENOUS

## 2013-07-08 MED ORDER — SODIUM CHLORIDE 0.9 % IJ SOLN
10.0000 mL | INTRAMUSCULAR | Status: DC | PRN
  Filled 2013-07-08: qty 10

## 2013-07-08 MED ORDER — SODIUM CHLORIDE 0.9 % IJ SOLN
3.0000 mL | Freq: Once | INTRAMUSCULAR | Status: DC | PRN
  Filled 2013-07-08: qty 10

## 2013-07-08 MED ORDER — DEXTROSE 5 % IV SOLN
Freq: Once | INTRAVENOUS | Status: AC
  Administered 2013-07-08: 13:00:00 via INTRAVENOUS

## 2013-07-08 MED ORDER — ONDANSETRON 8 MG/NS 50 ML IVPB
INTRAVENOUS | Status: AC
  Filled 2013-07-08: qty 8

## 2013-07-08 MED ORDER — MORPHINE SULFATE 30 MG PO TABS: 30.0000 mg | ORAL_TABLET | ORAL | Status: DC | PRN

## 2013-07-08 MED ORDER — SODIUM CHLORIDE 0.9 % IV SOLN: 800.0000 mg/m2 | Freq: Once | INTRAVENOUS | Status: DC

## 2013-07-08 MED ORDER — OXALIPLATIN CHEMO INJECTION 100 MG/20ML
80.0000 mg/m2 | Freq: Once | INTRAVENOUS | Status: AC
  Administered 2013-07-08: 135 mg via INTRAVENOUS
  Filled 2013-07-08: qty 27

## 2013-07-08 MED ORDER — DEXAMETHASONE SODIUM PHOSPHATE 10 MG/ML IJ SOLN
INTRAMUSCULAR | Status: AC
  Filled 2013-07-08: qty 1

## 2013-07-08 MED ORDER — SODIUM CHLORIDE 0.9 % IV SOLN
1330.0000 mg | Freq: Once | INTRAVENOUS | Status: AC
  Administered 2013-07-08: 1330 mg via INTRAVENOUS
  Filled 2013-07-08: qty 34.98

## 2013-07-08 MED ORDER — MORPHINE SULFATE ER 100 MG PO TBCR: 100.0000 mg | EXTENDED_RELEASE_TABLET | Freq: Two times a day (BID) | ORAL | Status: DC

## 2013-07-08 MED ORDER — SODIUM CHLORIDE 0.9 % IJ SOLN
10.0000 mL | INTRAMUSCULAR | Status: DC | PRN
  Administered 2013-07-08: 10 mL
  Filled 2013-07-08: qty 10

## 2013-07-08 MED ORDER — SODIUM CHLORIDE 0.9 % IV SOLN: Freq: Once | INTRAVENOUS | Status: DC

## 2013-07-08 MED ORDER — HEPARIN SOD (PORK) LOCK FLUSH 100 UNIT/ML IV SOLN
500.0000 [IU] | Freq: Once | INTRAVENOUS | Status: AC | PRN
  Administered 2013-07-08: 500 [IU]
  Filled 2013-07-08: qty 5

## 2013-07-08 MED ORDER — HEPARIN SOD (PORK) LOCK FLUSH 100 UNIT/ML IV SOLN
500.0000 [IU] | Freq: Once | INTRAVENOUS | Status: DC | PRN
  Filled 2013-07-08: qty 5

## 2013-07-08 MED ORDER — HEPARIN SOD (PORK) LOCK FLUSH 100 UNIT/ML IV SOLN
250.0000 [IU] | Freq: Once | INTRAVENOUS | Status: DC | PRN
  Filled 2013-07-08: qty 5

## 2013-07-08 NOTE — Progress Notes (Signed)
MD made aware of repeat Ca+ level. Will give IV fluids today and tomorrow and repeat labs on 12/30. Will also give zometa today.

## 2013-07-08 NOTE — Progress Notes (Signed)
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 She was admitted 06/27/2013 with a high fever. No clear source for infection was identified. She will suspected of having cholangitis. She is completing a course of Augmentin. No further fever. She was evaluated by surgery while in the hospital and felt to not have cholecystitis.  Initiates continues to have severe pain at the right lower back and hip area. The pain is worse with weightbearing. The pain is partially relieved with MSIR. She takes 30 mg of MSIR every 4 hours in addition to MS Contin. She is able to sleep through the night when she finds a comfortable position. She also reports new onset mild neck pain.  Her appetite had been good up until the past few days. She reports feeling "dizzy ". The left leg has been swollen for the past few days.  No difficulty with bowel function.  The biliary drain begin to leak around the skin exit a few days ago. The drain was uncapped and the leaking resolved.  Objective:  Vital signs in last 24 hours:  Blood pressure 127/74, pulse 121, resp. rate 18, height 5\' 4"  (1.626 m), weight 0 lb (0 kg), SpO2 100.00%.    HEENT: No thrush or ulcers Resp: Lungs clear bilaterally Cardio: Regular rate and rhythm GI: No hepatomegaly, call suppressive at the mid upper abdomen biliary drain site-dry. Bili is drainage in the catheter bag. Vascular: Trace pitting edema at the left leg below the knee. No erythema. Tender in the left pretibial area. Neuro: The motor exam appears intact in the left lower extremity. Good strength with knee flexion and plantar/dorsi flexion at the right foot. Limited right hip flexion secondary to pain  Musculoskeletal: Tender at the lower lumbar spine   Portacath/PICC-without erythema  Lab Results:  Lab Results  Component Value Date   WBC 17.1* 07/08/2013   HGB 11.6  07/08/2013   HCT 34.9 07/08/2013   MCV 85.1 07/08/2013   PLT 550* 07/08/2013   NEUTROABS 13.9* 07/08/2013   X-rays: CT of the abdomen and pelvis on 06/28/2013, compared to 05/15/2013: Several of the liver lesions are smaller, upper abdominal adenopathy is improved. Pathologic L3 fracture has worsened with increasing retropulsion, new sclerotic lesion at the left lateral rib, increased inflammatory change around the gallbladder   Medications: I have reviewed the patient's current medications.  Assessment/Plan: 1. 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 Staging chest CT 05/15/2013 showed a 1.3 cm soft tissue mass within the medial aspect of the right breast; 1.9 cm left supraclavicular lymph node; multiple enlarged mediastinal lymph nodes; 3 mm left upper lobe pulmonary nodule. Repeat CT abdomen and pelvis 05/15/2013 showed interval increase in the size of previously visualized lesions throughout the liver and multiple additional new lesions; bulky retroperitoneal lymphadenopathy was stable; extensive porta hepatis lymphadenopathy was redemonstrated; 7 mm soft tissue nodule in the subcutaneous fat of the anterior abdominal wall; 2.5 cm calcified lesion within the left hemipelvis; interval development of a pathologic compression fracture of the L3 vertebral body with extension of soft tissue components into the canal which was narrowed approximately 50%; metastatic involvement of the T9 vertebral body with small amount of extension into the vertebral canal; small amount of abnormal enhancing soft tissue at T7 with mild narrowing of the canal; destructive 2.6 cm lesion involving  the lateral aspect of the left third rib. Palliative radiation to T9 and L3 on 05/21/2013  Cycle 1 gemcitabine/oxaliplatin on 05/22/2013  Cycle 2 gemcitabine/oxaliplatin on  06/05/2013  Cycle 3 gemcitabine/oxaliplatin on 06/19/2013 CT abdomen 06/28/2013 with improvement in the liver metastases and upper abdominal adenopathy, progression of and L3 compression fracture and a new left lateral rib lesion 2. Obstructive jaundice secondary to #1 status post placement of a percutaneous biliary drain on 05/02/2013. Cholangiogram 05/16/2013 showed the existing left internal/external biliary drain was well positioned. The biliary system was decompressed. The internal/external biliary drain was exchanged. The jaundice has resolved. The biliary drain site began to leak a few days ago and the catheter is now open to external drainage 3. Pain secondary to bone metastases involving the lumbar spine, retroperitoneal adenopathy.  She continues to have pain at the right "hip ". The pain is likely related to the L3 compression fracture with nerve root compression She is taking MS Contin and MSIR. The pain persists. I will adjust the narcotic regimen.  Status post palliative radiation, one fraction, to the thoracic and lumbar spine metastases on 05/21/2013 4. Anorexia/weight loss secondary to #1 and pain 5. Subcutaneous lesion right scalp. Question lymph node.       6. Pain/tenderness at the left lower tibia-lytic lesion confirmed on a plain x-ray 05/24/2013, status post an intramedullary nail on 05/28/2013, radiation on 06/12/2013        7. admission with sepsis syndrome 06/28/2013-cultures negative, likely cholangitis, completing a course of Augmentin         8. Left lower extremity edema-she will be referred for a Doppler to rule out a DVT   Disposition:  She has completed 3 cycles of gemcitabine/oxaliplatin. She has tolerated the chemotherapy well and there is evidence of improvement in the liver metastases. I reviewed the CT images in radiology. There is progression of the pathologic L3 compression fracture. This likely explains her pain. The interventional radiologist did not feel the  lesion is amenable to a kyphoplasty procedure. She has completed palliative radiation. We increase the MS Contin to 100 mg every 12 hours. She will continue MSIR as needed for breakthrough pain.  We will refer Ms. Muraski to interventional radiology to assess the biliary drain. She will also be referred for a Doppler of the left leg to rule out a deep vein thrombosis.  She will return for an office visit and chemotherapy in 2 weeks.   Thornton Papas, MD  07/08/2013  11:36 AM

## 2013-07-08 NOTE — Progress Notes (Signed)
Nutrition followup completed with patient diagnosed with metastatic cholangiocarcinoma.  Noted recent hospital admission.  Patient is status post Biliary drain placement and is " not having a very good day."  Patient has had increased nausea and decreased oral intake.  Last weight documented as 128.75 pounds on December 19.  Patient requesting strategies for increased oral intake.  Nutrition diagnosis: Food and nutrition related knowledge deficit improved.  Intervention: Patient educated on strategies for eating with nausea.  She was encouraged to continue small frequent meals with high-calorie, high-protein foods to promote weight maintenance.  Oral nutrition supplements were recommended.  Fact sheets were provided and contact information given.  Teach back method used.    Monitoring, evaluation, and goals: Patient will tolerate increased calories and protein for weight maintenance.  Next visit: Monday, January 12, during chemotherapy.

## 2013-07-08 NOTE — Patient Instructions (Addendum)
[facility] Discharge Instructions for Patients Receiving Chemotherapy  Today you received the following chemotherapy agents Gemzar.oxaliplatin To help prevent nausea and vomiting after your treatment, we encourage you to take your nausea medication as directed.   If you develop nausea and vomiting that is not controlled by your nausea medication, call the clinic.   BELOW ARE SYMPTOMS THAT SHOULD BE REPORTED IMMEDIATELY:  *FEVER GREATER THAN 100.5 F  *CHILLS WITH OR WITHOUT FEVER  NAUSEA AND VOMITING THAT IS NOT CONTROLLED WITH YOUR NAUSEA MEDICATION  *UNUSUAL SHORTNESS OF BREATH  *UNUSUAL BRUISING OR BLEEDING  TENDERNESS IN MOUTH AND THROAT WITH OR WITHOUT PRESENCE OF ULCERS  *URINARY PROBLEMS  *BOWEL PROBLEMS  UNUSUAL RASH Items with * indicate a potential emergency and should be followed up as soon as possible.  Feel free to call the clinic you have any questions or concerns. The clinic phone number is [phone].   Zoledronic Acid injection (Hypercalcemia, Oncology) What is this medicine? ZOLEDRONIC ACID (ZOE le dron ik AS id) lowers the amount of calcium loss from bone. It is used to treat too much calcium in your blood from cancer. It is also used to prevent complications of cancer that has spread to the bone. This medicine may be used for other purposes; ask your health care provider or pharmacist if you have questions. COMMON BRAND NAME(S): Zometa What should I tell my health care provider before I take this medicine? They need to know if you have any of these conditions: -aspirin-sensitive asthma -cancer, especially if you are receiving medicines used to treat cancer -dental disease or wear dentures -infection -kidney disease -receiving corticosteroids like dexamethasone or prednisone -an unusual or allergic reaction to zoledronic acid, other medicines, foods, dyes, or preservatives -pregnant or trying to get pregnant -breast-feeding How  should I use this medicine? This medicine is for infusion into a vein. It is given by a health care professional in a hospital or clinic setting. Talk to your pediatrician regarding the use of this medicine in children. Special care may be needed. Overdosage: If you think you have taken too much of this medicine contact a poison control center or emergency room at once. NOTE: This medicine is only for you. Do not share this medicine with others. What if I miss a dose? It is important not to miss your dose. Call your doctor or health care professional if you are unable to keep an appointment. What may interact with this medicine? -certain antibiotics given by injection -NSAIDs, medicines for pain and inflammation, like ibuprofen or naproxen -some diuretics like bumetanide, furosemide -teriparatide -thalidomide This list may not describe all possible interactions. Give your health care provider a list of all the medicines, herbs, non-prescription drugs, or dietary supplements you use. Also tell them if you smoke, drink alcohol, or use illegal drugs. Some items may interact with your medicine. What should I watch for while using this medicine? Visit your doctor or health care professional for regular checkups. It may be some time before you see the benefit from this medicine. Do not stop taking your medicine unless your doctor tells you to. Your doctor may order blood tests or other tests to see how you are doing. Women should inform their doctor if they wish to become pregnant or think they might be pregnant. There is a potential for serious side effects to an unborn child. Talk to your health care professional or pharmacist for more information. You should make sure that you get enough  calcium and vitamin D while you are taking this medicine. Discuss the foods you eat and the vitamins you take with your health care professional. Some people who take this medicine have severe bone, joint, and/or  muscle pain. This medicine may also increase your risk for jaw problems or a broken thigh bone. Tell your doctor right away if you have severe pain in your jaw, bones, joints, or muscles. Tell your doctor if you have any pain that does not go away or that gets worse. Tell your dentist and dental surgeon that you are taking this medicine. You should not have major dental surgery while on this medicine. See your dentist to have a dental exam and fix any dental problems before starting this medicine. Take good care of your teeth while on this medicine. Make sure you see your dentist for regular follow-up appointments. What side effects may I notice from receiving this medicine? Side effects that you should report to your doctor or health care professional as soon as possible: -allergic reactions like skin rash, itching or hives, swelling of the face, lips, or tongue -anxiety, confusion, or depression -breathing problems -changes in vision -eye pain -feeling faint or lightheaded, falls -jaw pain, especially after dental work -mouth sores -muscle cramps, stiffness, or weakness -trouble passing urine or change in the amount of urine Side effects that usually do not require medical attention (report to your doctor or health care professional if they continue or are bothersome): -bone, joint, or muscle pain -constipation -diarrhea -fever -hair loss -irritation at site where injected -loss of appetite -nausea, vomiting -stomach upset -trouble sleeping -trouble swallowing -weak or tired This list may not describe all possible side effects. Call your doctor for medical advice about side effects. You may report side effects to FDA at 1-800-FDA-1088. Where should I keep my medicine? This drug is given in a hospital or clinic and will not be stored at home. NOTE: This sheet is a summary. It may not cover all possible information. If you have questions about this medicine, talk to your doctor,  pharmacist, or health care provider.  2014, [employer]. (2012-12-06 13:03:13)

## 2013-07-08 NOTE — Telephone Encounter (Signed)
gv and printed appt sched and avs for pt for DEc and Jan      °

## 2013-07-09 ENCOUNTER — Ambulatory Visit (HOSPITAL_BASED_OUTPATIENT_CLINIC_OR_DEPARTMENT_OTHER): Payer: BC Managed Care – PPO

## 2013-07-09 ENCOUNTER — Other Ambulatory Visit: Payer: Self-pay | Admitting: Oncology

## 2013-07-09 ENCOUNTER — Other Ambulatory Visit: Payer: Self-pay | Admitting: *Deleted

## 2013-07-09 ENCOUNTER — Telehealth: Payer: Self-pay | Admitting: *Deleted

## 2013-07-09 ENCOUNTER — Other Ambulatory Visit (HOSPITAL_BASED_OUTPATIENT_CLINIC_OR_DEPARTMENT_OTHER): Payer: BC Managed Care – PPO

## 2013-07-09 DIAGNOSIS — R634 Abnormal weight loss: Secondary | ICD-10-CM

## 2013-07-09 DIAGNOSIS — C7951 Secondary malignant neoplasm of bone: Secondary | ICD-10-CM

## 2013-07-09 DIAGNOSIS — C221 Intrahepatic bile duct carcinoma: Secondary | ICD-10-CM

## 2013-07-09 LAB — COMPREHENSIVE METABOLIC PANEL (CC13)
ALT: 77 U/L — ABNORMAL HIGH (ref 0–55)
AST: 37 U/L — ABNORMAL HIGH (ref 5–34)
Albumin: 2.2 g/dL — ABNORMAL LOW (ref 3.5–5.0)
Alkaline Phosphatase: 469 U/L — ABNORMAL HIGH (ref 40–150)
Anion Gap: 11 mEq/L (ref 3–11)
Potassium: 4.1 mEq/L (ref 3.5–5.1)
Sodium: 130 mEq/L — ABNORMAL LOW (ref 136–145)
Total Protein: 6.6 g/dL (ref 6.4–8.3)

## 2013-07-09 LAB — CEA: CEA: 9.6 ng/mL — ABNORMAL HIGH (ref 0.0–5.0)

## 2013-07-09 MED ORDER — SODIUM CHLORIDE 0.9 % IJ SOLN
10.0000 mL | INTRAMUSCULAR | Status: DC | PRN
  Administered 2013-07-09: 10 mL via INTRAVENOUS
  Filled 2013-07-09: qty 10

## 2013-07-09 MED ORDER — SODIUM CHLORIDE 0.9 % IV SOLN
INTRAVENOUS | Status: DC
  Administered 2013-07-09: 16:00:00 via INTRAVENOUS

## 2013-07-09 MED ORDER — HEPARIN SOD (PORK) LOCK FLUSH 100 UNIT/ML IV SOLN
500.0000 [IU] | Freq: Once | INTRAVENOUS | Status: AC
  Administered 2013-07-09: 500 [IU] via INTRAVENOUS
  Filled 2013-07-09: qty 5

## 2013-07-09 MED ORDER — CALCITONIN (SALMON) 200 UNIT/ML IJ SOLN
230.0000 [IU] | Freq: Once | INTRAMUSCULAR | Status: AC
  Administered 2013-07-09: 230 [IU] via SUBCUTANEOUS
  Filled 2013-07-09: qty 1.15

## 2013-07-09 NOTE — Patient Instructions (Signed)
Dehydration, Adult Dehydration is when you lose more fluids from the body than you take in. Vital organs like the kidneys, brain, and heart cannot function without a proper amount of fluids and salt. Any loss of fluids from the body can cause dehydration.  CAUSES   Vomiting.  Diarrhea.  Excessive sweating.  Excessive urine output.  Fever. SYMPTOMS  Mild dehydration  Thirst.  Dry lips.  Slightly dry mouth. Moderate dehydration  Very dry mouth.  Sunken eyes.  Skin does not bounce back quickly when lightly pinched and released.  Dark urine and decreased urine production.  Decreased tear production.  Headache. Severe dehydration  Very dry mouth.  Extreme thirst.  Rapid, weak pulse (more than 100 beats per minute at rest).  Cold hands and feet.  Not able to sweat in spite of heat and temperature.  Rapid breathing.  Blue lips.  Confusion and lethargy.  Difficulty being awakened.  Minimal urine production.  No tears. DIAGNOSIS  Your caregiver will diagnose dehydration based on your symptoms and your exam. Blood and urine tests will help confirm the diagnosis. The diagnostic evaluation should also identify the cause of dehydration. TREATMENT  Treatment of mild or moderate dehydration can often be done at home by increasing the amount of fluids that you drink. It is best to drink small amounts of fluid more often. Drinking too much at one time can make vomiting worse. Refer to the home care instructions below. Severe dehydration needs to be treated at the hospital where you will probably be given intravenous (IV) fluids that contain water and electrolytes. HOME CARE INSTRUCTIONS   Ask your caregiver about specific rehydration instructions.  Drink enough fluids to keep your urine clear or pale yellow.  Drink small amounts frequently if you have nausea and vomiting.  Eat as you normally do.  Avoid:  Foods or drinks high in sugar.  Carbonated  drinks.  Juice.  Extremely hot or cold fluids.  Drinks with caffeine.  Fatty, greasy foods.  Alcohol.  Tobacco.  Overeating.  Gelatin desserts.  Wash your hands well to avoid spreading bacteria and viruses.  Only take over-the-counter or prescription medicines for pain, discomfort, or fever as directed by your caregiver.  Ask your caregiver if you should continue all prescribed and over-the-counter medicines.  Keep all follow-up appointments with your caregiver. SEEK MEDICAL CARE IF:  You have abdominal pain and it increases or stays in one area (localizes).  You have a rash, stiff neck, or severe headache.  You are irritable, sleepy, or difficult to awaken.  You are weak, dizzy, or extremely thirsty. SEEK IMMEDIATE MEDICAL CARE IF:   You are unable to keep fluids down or you get worse despite treatment.  You have frequent episodes of vomiting or diarrhea.  You have blood or green matter (bile) in your vomit.  You have blood in your stool or your stool looks black and tarry.  You have not urinated in 6 to 8 hours, or you have only urinated a small amount of very dark urine.  You have a fever.  You faint. MAKE SURE YOU:   Understand these instructions.  Will watch your condition.  Will get help right away if you are not doing well or get worse. Document Released: 06/27/2005 Document Revised: 09/19/2011 Document Reviewed: 02/14/2011 [facility] Patient Information 2014 [place], [place].  It was my pleasure to serve you today.

## 2013-07-09 NOTE — Telephone Encounter (Signed)
Per request of Dr. Truett Perna, spoke with patient's husband.  He stated the patient is improved today compared to last night.  Husband stated she was confused last night, but just slightly confused today.  He stated that his wife's pain was better.  He denied her having any neuro symptoms, etc. except "slight" confusion.  He stated she was eating drinking, and urinating okay.  Due to Home Health not being available early today go to patient's house and obtain lab work, Dr. Truett Perna requested patient to come in and have labs and inustion.  This RN phoned patient's husband who agreed to bring his wife for appointment this afternoon at 2:45.  Dr. Truett Perna aware.

## 2013-07-09 NOTE — Telephone Encounter (Signed)
Spoke with intake at [facility]: case is in process now. Does not know when nurse will get to home today. Suggests I call back in couple hours. Spoke with pharmacy team and requesting alternative IVF instead on 0.9% saline due to outpatient shortage. OK for 0.45 % saline per Dr. Truett Perna. Transferred to nursing and was told they will be seeing her today around 5pm. Made her aware this was no acceptable and she needs to be seen this morning or early afternoon for stat lab draw and fluids due to hypercalcemia. Was told she will have supervisor call back. Discussed w/MD and decided it is best to have Bianca Logan come in to office for her labs and fluids and cancel home health referral. Called [facility] and cancelled referral since they could not see patient in time period necessary. Husband was notified by Vicie Mutters, RN to come to office today at 2:30 for lab/IVF.

## 2013-07-10 ENCOUNTER — Other Ambulatory Visit: Payer: Self-pay | Admitting: *Deleted

## 2013-07-10 ENCOUNTER — Other Ambulatory Visit (HOSPITAL_COMMUNITY): Payer: Self-pay | Admitting: Diagnostic Radiology

## 2013-07-10 ENCOUNTER — Ambulatory Visit (HOSPITAL_COMMUNITY)
Admission: RE | Admit: 2013-07-10 | Discharge: 2013-07-10 | Disposition: A | Discharge status: Home / Self Care | Payer: BC Managed Care – PPO | Source: Ambulatory Visit | Attending: Oncology | Admitting: Oncology

## 2013-07-10 ENCOUNTER — Other Ambulatory Visit: Payer: Self-pay | Admitting: Oncology

## 2013-07-10 ENCOUNTER — Ambulatory Visit (HOSPITAL_BASED_OUTPATIENT_CLINIC_OR_DEPARTMENT_OTHER): Payer: BC Managed Care – PPO

## 2013-07-10 ENCOUNTER — Telehealth: Payer: Self-pay | Admitting: *Deleted

## 2013-07-10 ENCOUNTER — Other Ambulatory Visit (HOSPITAL_BASED_OUTPATIENT_CLINIC_OR_DEPARTMENT_OTHER): Payer: BC Managed Care – PPO

## 2013-07-10 ENCOUNTER — Ambulatory Visit: Payer: BC Managed Care – PPO

## 2013-07-10 ENCOUNTER — Ambulatory Visit: Payer: BC Managed Care – PPO | Admitting: Radiation Oncology

## 2013-07-10 DIAGNOSIS — T85698A Other mechanical complication of other specified internal prosthetic devices, implants and grafts, initial encounter: Secondary | ICD-10-CM | POA: Insufficient documentation

## 2013-07-10 DIAGNOSIS — I82409 Acute embolism and thrombosis of unspecified deep veins of unspecified lower extremity: Secondary | ICD-10-CM

## 2013-07-10 DIAGNOSIS — I82402 Acute embolism and thrombosis of unspecified deep veins of left lower extremity: Secondary | ICD-10-CM

## 2013-07-10 DIAGNOSIS — I824Y9 Acute embolism and thrombosis of unspecified deep veins of unspecified proximal lower extremity: Secondary | ICD-10-CM | POA: Insufficient documentation

## 2013-07-10 DIAGNOSIS — C7951 Secondary malignant neoplasm of bone: Secondary | ICD-10-CM

## 2013-07-10 DIAGNOSIS — C221 Intrahepatic bile duct carcinoma: Secondary | ICD-10-CM | POA: Insufficient documentation

## 2013-07-10 DIAGNOSIS — I824Z9 Acute embolism and thrombosis of unspecified deep veins of unspecified distal lower extremity: Secondary | ICD-10-CM | POA: Insufficient documentation

## 2013-07-10 DIAGNOSIS — R609 Edema, unspecified: Secondary | ICD-10-CM

## 2013-07-10 DIAGNOSIS — Y849 Medical procedure, unspecified as the cause of abnormal reaction of the patient, or of later complication, without mention of misadventure at the time of the procedure: Secondary | ICD-10-CM | POA: Insufficient documentation

## 2013-07-10 LAB — CBC WITH DIFFERENTIAL/PLATELET
Basophils Absolute: 0.1 10*3/uL (ref 0.0–0.1)
Eosinophils Absolute: 0 10*3/uL (ref 0.0–0.5)
HCT: 29.5 % — ABNORMAL LOW (ref 34.8–46.6)
HGB: 10.1 g/dL — ABNORMAL LOW (ref 11.6–15.9)
LYMPH%: 3.6 % — ABNORMAL LOW (ref 14.0–49.7)
MCH: 28.7 pg (ref 25.1–34.0)
MCV: 84.2 fL (ref 79.5–101.0)
MONO%: 1.9 % (ref 0.0–14.0)
NEUT#: 14.6 10*3/uL — ABNORMAL HIGH (ref 1.5–6.5)
NEUT%: 94 % — ABNORMAL HIGH (ref 38.4–76.8)
Platelets: 355 10*3/uL (ref 145–400)
RDW: 16.1 % — ABNORMAL HIGH (ref 11.2–14.5)

## 2013-07-10 LAB — COMPREHENSIVE METABOLIC PANEL (CC13)
Albumin: 2.1 g/dL — ABNORMAL LOW (ref 3.5–5.0)
Alkaline Phosphatase: 407 U/L — ABNORMAL HIGH (ref 40–150)
Anion Gap: 12 mEq/L — ABNORMAL HIGH (ref 3–11)
BUN: 27.8 mg/dL — ABNORMAL HIGH (ref 7.0–26.0)
CO2: 22 mEq/L (ref 22–29)
Creatinine: 0.8 mg/dL (ref 0.6–1.1)
Glucose: 135 mg/dl (ref 70–140)
Potassium: 3.6 mEq/L (ref 3.5–5.1)
Sodium: 129 mEq/L — ABNORMAL LOW (ref 136–145)
Total Protein: 6.7 g/dL (ref 6.4–8.3)

## 2013-07-10 MED ORDER — ENOXAPARIN SODIUM 100 MG/ML ~~LOC~~ SOLN: 100.0000 mg | SUBCUTANEOUS | Status: DC

## 2013-07-10 MED ORDER — IOHEXOL 300 MG/ML  SOLN
10.0000 mL | Freq: Once | INTRAMUSCULAR | Status: AC | PRN
  Administered 2013-07-10: 10 mL

## 2013-07-10 MED ORDER — ENOXAPARIN SODIUM 100 MG/ML ~~LOC~~ SOLN
90.0000 mg | Freq: Once | SUBCUTANEOUS | Status: DC
  Administered 2013-07-10: 100 mg via SUBCUTANEOUS
  Filled 2013-07-10: qty 1

## 2013-07-10 MED ORDER — WARFARIN SODIUM 5 MG PO TABS: 5.0000 mg | ORAL_TABLET | Freq: Every day | ORAL | Status: DC

## 2013-07-10 MED ORDER — ENOXAPARIN SODIUM 100 MG/ML ~~LOC~~ SOLN: 90.0000 mg | SUBCUTANEOUS | Status: DC

## 2013-07-10 NOTE — Procedures (Signed)
Successful exchange of the 12 French internal/external biliary drain.  No immediate complication.  The tube was capped.

## 2013-07-10 NOTE — Progress Notes (Signed)
Bilateral lower extremity venous duplex completed.  Right:  No evidence of DVT, superficial thrombosis, or Baker's cyst.  Left: DVT noted in the posterior tibial and popliteal veins.  No evidence of superficial thrombosis.  No Baker's cyst.

## 2013-07-10 NOTE — Progress Notes (Signed)
Bianca Logan here to get started on Lovenox.  Will be receiving it at home, husband will administer the injections.  Darl Pikes, RN talked to pt and husband about blood clots, Lovenox and Coumadin.  Husband given verbal instruction about giving injection and wasting proper amount for injection.  He administered Lovenox 90 mg today without difficulty.  All questions answered.  Prescriptions called into her pharmacy for the Lovenox and coumadin.

## 2013-07-10 NOTE — Telephone Encounter (Signed)
Called in Lovenox to [facility] (will be open today until 10 pm)-will check back in hour to assess copay. Per Dr. Cyndie Chime needs to start Lovenox 90 mg daily and Coumadin 5 mg daily. Check PT/INR on Monday. Does not need IV fluids today.

## 2013-07-10 NOTE — Progress Notes (Signed)
Educated patient/husband on DVT/PE and actions, side effects of warfarin and Lovenox and monitoring of PT/INR. Co pay for lovenox was $15 per [employer]. Injection nurse to teach administration of lovenox to husband.

## 2013-07-12 ENCOUNTER — Other Ambulatory Visit (HOSPITAL_COMMUNITY): Payer: Self-pay | Admitting: Interventional Radiology

## 2013-07-12 ENCOUNTER — Telehealth: Payer: Self-pay | Admitting: Oncology

## 2013-07-12 DIAGNOSIS — C221 Intrahepatic bile duct carcinoma: Secondary | ICD-10-CM

## 2013-07-12 NOTE — Telephone Encounter (Signed)
s.w. pt husband and advised on lab on 1.5.15...ok and aware

## 2013-07-15 ENCOUNTER — Ambulatory Visit (HOSPITAL_BASED_OUTPATIENT_CLINIC_OR_DEPARTMENT_OTHER): Payer: BC Managed Care – PPO | Admitting: Pharmacist

## 2013-07-15 ENCOUNTER — Other Ambulatory Visit (HOSPITAL_COMMUNITY): Payer: Self-pay | Admitting: Interventional Radiology

## 2013-07-15 ENCOUNTER — Other Ambulatory Visit (HOSPITAL_BASED_OUTPATIENT_CLINIC_OR_DEPARTMENT_OTHER): Payer: BC Managed Care – PPO

## 2013-07-15 ENCOUNTER — Other Ambulatory Visit: Payer: Self-pay | Admitting: *Deleted

## 2013-07-15 ENCOUNTER — Other Ambulatory Visit: Payer: Self-pay | Admitting: Radiology

## 2013-07-15 ENCOUNTER — Ambulatory Visit (HOSPITAL_COMMUNITY)
Admission: RE | Admit: 2013-07-15 | Discharge: 2013-07-15 | Disposition: A | Discharge status: Home / Self Care | Payer: BC Managed Care – PPO | Source: Ambulatory Visit | Attending: Interventional Radiology | Admitting: Interventional Radiology

## 2013-07-15 DIAGNOSIS — C787 Secondary malignant neoplasm of liver and intrahepatic bile duct: Secondary | ICD-10-CM

## 2013-07-15 DIAGNOSIS — C7952 Secondary malignant neoplasm of bone marrow: Secondary | ICD-10-CM

## 2013-07-15 DIAGNOSIS — C221 Intrahepatic bile duct carcinoma: Secondary | ICD-10-CM

## 2013-07-15 DIAGNOSIS — I82409 Acute embolism and thrombosis of unspecified deep veins of unspecified lower extremity: Secondary | ICD-10-CM

## 2013-07-15 DIAGNOSIS — I82402 Acute embolism and thrombosis of unspecified deep veins of left lower extremity: Secondary | ICD-10-CM

## 2013-07-15 DIAGNOSIS — C7951 Secondary malignant neoplasm of bone: Secondary | ICD-10-CM

## 2013-07-15 LAB — CBC WITH DIFFERENTIAL/PLATELET
BASO%: 0.4 % (ref 0.0–2.0)
Basophils Absolute: 0 10*3/uL (ref 0.0–0.1)
EOS%: 0.5 % (ref 0.0–7.0)
Eosinophils Absolute: 0 10*3/uL (ref 0.0–0.5)
HCT: 29.3 % — ABNORMAL LOW (ref 34.8–46.6)
HEMOGLOBIN: 10.2 g/dL — AB (ref 11.6–15.9)
LYMPH#: 0.8 10*3/uL — AB (ref 0.9–3.3)
LYMPH%: 8.4 % — ABNORMAL LOW (ref 14.0–49.7)
MCH: 28.9 pg (ref 25.1–34.0)
MCHC: 34.8 g/dL (ref 31.5–36.0)
MCV: 83 fL (ref 79.5–101.0)
MONO#: 0.5 10*3/uL (ref 0.1–0.9)
MONO%: 5.7 % (ref 0.0–14.0)
NEUT#: 8 10*3/uL — ABNORMAL HIGH (ref 1.5–6.5)
NEUT%: 85 % — ABNORMAL HIGH (ref 38.4–76.8)
Platelets: 188 10*3/uL (ref 145–400)
RBC: 3.53 10*6/uL — AB (ref 3.70–5.45)
RDW: 15.7 % — ABNORMAL HIGH (ref 11.2–14.5)
WBC: 9.4 10*3/uL (ref 3.9–10.3)

## 2013-07-15 LAB — POCT INR: INR: 1.9

## 2013-07-15 LAB — COMPREHENSIVE METABOLIC PANEL (CC13)
ALBUMIN: 2.1 g/dL — AB (ref 3.5–5.0)
ALT: 110 U/L — AB (ref 0–55)
ANION GAP: 11 meq/L (ref 3–11)
AST: 120 U/L — ABNORMAL HIGH (ref 5–34)
Alkaline Phosphatase: 878 U/L — ABNORMAL HIGH (ref 40–150)
BUN: 6 mg/dL — ABNORMAL LOW (ref 7.0–26.0)
CALCIUM: 8 mg/dL — AB (ref 8.4–10.4)
CHLORIDE: 99 meq/L (ref 98–109)
CO2: 21 meq/L — AB (ref 22–29)
CREATININE: 0.5 mg/dL — AB (ref 0.6–1.1)
Glucose: 106 mg/dl (ref 70–140)
POTASSIUM: 4.1 meq/L (ref 3.5–5.1)
Sodium: 131 mEq/L — ABNORMAL LOW (ref 136–145)
Total Bilirubin: 1.47 mg/dL — ABNORMAL HIGH (ref 0.20–1.20)
Total Protein: 7 g/dL (ref 6.4–8.3)

## 2013-07-15 LAB — PROTIME-INR
INR: 1.9 — AB (ref 2.00–3.50)
Protime: 22.8 Seconds — ABNORMAL HIGH (ref 10.6–13.4)

## 2013-07-15 NOTE — Progress Notes (Signed)
INR = 1.9 today. Pt has been on coumadin 5mg  daily and Lovenox 90mg  daily since 07/10/13 for DVT. Minimal bruising on abdomen from Lovenox. Augmentin completed on 07/11/13. Pt no longer taking Calcium supplement and MVI. She is no longer taking a probiotic since Augmentin stopped. This is the first visit to the coumadin clinic for the patient.  A full discussion of the nature of anticoagulants has been carried out.  The need for frequent and regular monitoring, precise dosage adjustment and compliance is stressed.  Side effects of potential bleeding are discussed.  Drug-drug interactions and drug-food interactions have been discussed.  We reviewed the importance of consistency with Vitamin K intake.  Warfarin education sheets have been provided to pt.  I informed him to avoid great swings in general diet. Pt does not drink alcohol. Does not use herbal supplements or OTC's. Pt is scheduled for biliary tube replacement at 1:30pm on 07/16/13. Pt will hold Lovenox tonight. Per Dr. Benay Spice, discontinue coumadin permanently. Continue Lovenox 90mg  indefinitely. It is hard for pt to make multiple trips to the [facility].  Dr. Benay Spice feels safer and more comfortable with pt remaining on Lovenox only. Resume Lovenox 90mg  daily on 07/16/13. Lovenox refill called to pharmacy.

## 2013-07-15 NOTE — Patient Instructions (Signed)
Discontinue Coumadin. Do not give Lovenox tonight. Resume Lovenox 90mg  daily on 07/16/13.

## 2013-07-16 ENCOUNTER — Ambulatory Visit (HOSPITAL_COMMUNITY)
Admission: RE | Admit: 2013-07-16 | Discharge: 2013-07-16 | Disposition: A | Discharge status: Home / Self Care | Payer: BC Managed Care – PPO | Source: Ambulatory Visit | Attending: Interventional Radiology | Admitting: Interventional Radiology

## 2013-07-16 ENCOUNTER — Other Ambulatory Visit (HOSPITAL_COMMUNITY): Payer: Self-pay | Admitting: Interventional Radiology

## 2013-07-16 ENCOUNTER — Encounter (HOSPITAL_COMMUNITY): Payer: Self-pay | Admitting: Pharmacy Technician

## 2013-07-16 ENCOUNTER — Encounter (HOSPITAL_COMMUNITY): Payer: Self-pay

## 2013-07-16 ENCOUNTER — Telehealth: Payer: Self-pay | Admitting: *Deleted

## 2013-07-16 ENCOUNTER — Ambulatory Visit (HOSPITAL_COMMUNITY): Payer: BC Managed Care – PPO

## 2013-07-16 DIAGNOSIS — C221 Intrahepatic bile duct carcinoma: Secondary | ICD-10-CM | POA: Insufficient documentation

## 2013-07-16 LAB — COMPREHENSIVE METABOLIC PANEL
ALBUMIN: 2.1 g/dL — AB (ref 3.5–5.2)
ALT: 103 U/L — ABNORMAL HIGH (ref 0–35)
AST: 101 U/L — ABNORMAL HIGH (ref 0–37)
Alkaline Phosphatase: 768 U/L — ABNORMAL HIGH (ref 39–117)
BUN: 6 mg/dL (ref 6–23)
CO2: 23 mEq/L (ref 19–32)
CREATININE: 0.38 mg/dL — AB (ref 0.50–1.10)
Calcium: 7.5 mg/dL — ABNORMAL LOW (ref 8.4–10.5)
Chloride: 96 mEq/L (ref 96–112)
GFR calc non Af Amer: >90 mL/min (ref >90)
GLUCOSE: 98 mg/dL (ref 70–99)
Potassium: 3.7 mEq/L (ref 3.7–5.3)
Sodium: 133 mEq/L — ABNORMAL LOW (ref 137–147)
TOTAL PROTEIN: 6.5 g/dL (ref 6.0–8.3)
Total Bilirubin: 1.2 mg/dL (ref 0.3–1.2)

## 2013-07-16 LAB — CBC
HEMATOCRIT: 26.7 % — AB (ref 36.0–46.0)
HEMOGLOBIN: 9.2 g/dL — AB (ref 12.0–15.0)
MCH: 28.6 pg (ref 26.0–34.0)
MCHC: 34.5 g/dL (ref 30.0–36.0)
MCV: 82.9 fL (ref 78.0–100.0)
Platelets: 151 10*3/uL (ref 150–400)
RBC: 3.22 MIL/uL — ABNORMAL LOW (ref 3.87–5.11)
RDW: 15.9 % — ABNORMAL HIGH (ref 11.5–15.5)
WBC: 8.5 10*3/uL (ref 4.0–10.5)

## 2013-07-16 LAB — PROTIME-INR
INR: 1.5 — ABNORMAL HIGH (ref 0.00–1.49)
PROTHROMBIN TIME: 17.7 s — AB (ref 11.6–15.2)

## 2013-07-16 LAB — APTT: APTT: 45 s — AB (ref 24–37)

## 2013-07-16 MED ORDER — ENOXAPARIN SODIUM 100 MG/ML ~~LOC~~ SOLN: 90.0000 mg | SUBCUTANEOUS | Status: AC

## 2013-07-16 MED ORDER — HEPARIN SOD (PORK) LOCK FLUSH 100 UNIT/ML IV SOLN: 500.0000 [IU] | INTRAVENOUS | Status: DC

## 2013-07-16 MED ORDER — PIPERACILLIN-TAZOBACTAM 3.375 G IVPB
3.3750 g | Freq: Once | INTRAVENOUS | Status: AC
  Administered 2013-07-16: 3.375 g via INTRAVENOUS
  Filled 2013-07-16: qty 50

## 2013-07-16 MED ORDER — FENTANYL CITRATE 0.05 MG/ML IJ SOLN
INTRAMUSCULAR | Status: AC | PRN
  Administered 2013-07-16: 100 ug via INTRAVENOUS
  Administered 2013-07-16: 50 ug via INTRAVENOUS

## 2013-07-16 MED ORDER — LIDOCAINE HCL 1 % IJ SOLN
INTRAMUSCULAR | Status: AC
  Filled 2013-07-16: qty 20

## 2013-07-16 MED ORDER — HEPARIN SOD (PORK) LOCK FLUSH 100 UNIT/ML IV SOLN
500.0000 [IU] | INTRAVENOUS | Status: DC | PRN
  Administered 2013-07-16: 500 [IU]
  Filled 2013-07-16: qty 5

## 2013-07-16 MED ORDER — MIDAZOLAM HCL 2 MG/2ML IJ SOLN
INTRAMUSCULAR | Status: AC | PRN
Start: 2013-07-16 — End: 2013-07-16
  Administered 2013-07-16: 0.5 mg via INTRAVENOUS
  Administered 2013-07-16: 1 mg via INTRAVENOUS
  Administered 2013-07-16 (×4): 0.5 mg via INTRAVENOUS
  Administered 2013-07-16 (×2): 1 mg via INTRAVENOUS

## 2013-07-16 MED ORDER — FENTANYL CITRATE 0.05 MG/ML IJ SOLN
INTRAMUSCULAR | Status: AC
  Filled 2013-07-16: qty 6

## 2013-07-16 MED ORDER — SODIUM CHLORIDE 0.9 % IV SOLN
Freq: Once | INTRAVENOUS | Status: AC
  Administered 2013-07-16: 12:00:00 via INTRAVENOUS

## 2013-07-16 MED ORDER — MIDAZOLAM HCL 2 MG/2ML IJ SOLN
INTRAMUSCULAR | Status: AC
  Filled 2013-07-16: qty 6

## 2013-07-16 MED ORDER — IOHEXOL 300 MG/ML  SOLN
20.0000 mL | Freq: Once | INTRAMUSCULAR | Status: AC | PRN
  Administered 2013-07-16: 1 mL

## 2013-07-16 NOTE — Procedures (Signed)
Interventional Radiology Procedure Note  Procedure: 1.) Cholangiogram 2.) Placement of 10 x 60 covered biliary stent 3.) Biliary drain exchange (00B) Complications: None Recommendations: - Bag drainage x 24 hrs, then pt to cap at home - Return to IR in 2 weeks.  If stent draining well, pull tube back above stent and initiate external drain capped trial.  If she continues to do well, can consider tube removal   Signed,  Criselda Peaches, MD [facility]

## 2013-07-16 NOTE — Discharge Instructions (Signed)
Biliary Tube Placement Care After Refer to this sheet in the next few weeks. These instructions provide you with information on caring for yourself after your procedure. Your caregiver may also give you more specific instructions. Your treatment has been planned according to current medical practices, but problems sometimes occur. Call your caregiver if you have any problems or questions after your procedure. HOME CARE INSTRUCTIONS   Do not use machinery, drive, or make legal decisions for 24 hours after your procedure.  Have someone drive you home.  Resume your usual diet. Avoid alcoholic beverages for 24 hours after your procedure.  Rest for the remainder of the day.  Only take over-the-counter or prescription medicines for pain, discomfort, or fever as directed by your caregiver. Do not take aspirin unless directed otherwise. This can make bleeding worse.  Clean the tube insertion site as directed by your caregiver.  Take showers, not baths. Avoid pools and hot tubs. Before showering, cover the area with plastic wrap and tape the edges of the plastic wrap to your skin. This is done to keep your skin dry.  Keep the skin around the insertion site dry. If the area gets wet, dry the skin completely.  Keep all follow-up appointments. SEEK MEDICAL CARE IF:   Your redness, soreness, or swelling at the tube insertion site worsens despite good cleaning.  Your pain worsens after an initial improvement.  You have any questions about your tube. SEEK IMMEDIATE MEDICAL CARE IF:   You have a fever.  You have chills or increased pain.  Your skin breaks down around the tube.  You have leakage of bile around the tube.  Your tube becomes blocked or clogged. MAKE SURE YOU:   Understand these instructions.  Will watch your condition.  Will get help right away if you are not doing well or get worse. Document Released: 02/09/2004 Document Revised: 09/19/2011 Document Reviewed:  02/11/2011 [facility] Patient Information 2014 [place]. Moderate Sedation, Adult Moderate sedation is given to help you relax or even sleep through a procedure. You may remain sleepy, be clumsy, or have poor balance for several hours following this procedure. Arrange for a responsible adult, family member, or friend to take you home. A responsible adult should stay with you for at least 24 hours or until the medicines have worn off.  Do not participate in any activities where you could become injured for the next 24 hours, or until you feel normal again. Do not:  Drive.  Swim.  Ride a bicycle.  Operate heavy machinery.  Cook.  Use power tools.  Climb ladders.  Work at [employer].  Do not make important decisions or sign legal documents until you are improved.  Vomiting may occur if you eat too soon. When you can drink without vomiting, try water, juice, or soup. Try solid foods if you feel little or no nausea.  Only take over-the-counter or prescription medications for pain, discomfort, or fever as directed by your caregiver.If pain medications have been prescribed for you, ask your caregiver how soon it is safe to take them.  Make sure you and your family fully understands everything about the medication given to you. Make sure you understand what side effects may occur.  You should not drink alcohol, take sleeping pills, or medications that cause drowsiness for at least 24 hours.  If you smoke, do not smoke alone.  If you are feeling better, you may resume normal activities 24 hours after receiving sedation.  Keep all appointments as  scheduled. Follow all instructions.  Ask questions if you do not understand. SEEK MEDICAL CARE IF:   Your skin is pale or bluish in color.  You continue to feel sick to your stomach (nauseous) or throw up (vomit).  Your pain is getting worse and not helped by medication.  You have bleeding or swelling.  You are still sleepy or  feeling clumsy after 24 hours. SEEK IMMEDIATE MEDICAL CARE IF:   You develop a rash.  You have difficulty breathing.  You develop any type of allergic problem.  You have a fever. Document Released: 03/22/2001 Document Revised: 09/19/2011 Document Reviewed: 08/13/2007 [facility] Patient Information 2014 [place].

## 2013-07-16 NOTE — Telephone Encounter (Signed)
Per request of Dr. Benay Spice, spoke with [facility], re: patient's request for a lift chair.  They will contact the patient to discuss needs and insurance coverage.

## 2013-07-18 ENCOUNTER — Other Ambulatory Visit: Payer: Self-pay | Admitting: *Deleted

## 2013-07-18 MED ORDER — MORPHINE SULFATE 30 MG PO TABS: 30.0000 mg | ORAL_TABLET | ORAL | Status: DC | PRN

## 2013-07-18 NOTE — Telephone Encounter (Signed)
Message from pt requesting refill on MS IR. Rx will be left in prescription book for pick up. Pt aware.

## 2013-07-19 ENCOUNTER — Telehealth: Payer: Self-pay | Admitting: *Deleted

## 2013-07-19 NOTE — Telephone Encounter (Signed)
Spoke with patient by phone.  She stated they are working with [facility] to obtain a chair lift.  She stated she was feeling better and denied any complaints.  She is aware of the 07/22/13 appointment with Dr. Benay Spice.  She appreciated the call.

## 2013-07-21 ENCOUNTER — Other Ambulatory Visit: Payer: Self-pay | Admitting: Oncology

## 2013-07-22 ENCOUNTER — Ambulatory Visit (HOSPITAL_BASED_OUTPATIENT_CLINIC_OR_DEPARTMENT_OTHER): Payer: BC Managed Care – PPO

## 2013-07-22 ENCOUNTER — Other Ambulatory Visit (HOSPITAL_BASED_OUTPATIENT_CLINIC_OR_DEPARTMENT_OTHER): Payer: BC Managed Care – PPO

## 2013-07-22 ENCOUNTER — Ambulatory Visit (HOSPITAL_BASED_OUTPATIENT_CLINIC_OR_DEPARTMENT_OTHER): Payer: BC Managed Care – PPO | Admitting: Oncology

## 2013-07-22 ENCOUNTER — Ambulatory Visit: Payer: BC Managed Care – PPO | Admitting: Nutrition

## 2013-07-22 ENCOUNTER — Telehealth: Payer: Self-pay | Admitting: *Deleted

## 2013-07-22 ENCOUNTER — Telehealth: Payer: Self-pay | Admitting: Oncology

## 2013-07-22 VITALS — BP 121/77 | HR 127 | Temp 97.9°F | Resp 18 | Ht 64.0 in | Wt 117.0 lb

## 2013-07-22 DIAGNOSIS — C7951 Secondary malignant neoplasm of bone: Secondary | ICD-10-CM

## 2013-07-22 DIAGNOSIS — C7952 Secondary malignant neoplasm of bone marrow: Principal | ICD-10-CM

## 2013-07-22 DIAGNOSIS — C221 Intrahepatic bile duct carcinoma: Secondary | ICD-10-CM

## 2013-07-22 DIAGNOSIS — I82409 Acute embolism and thrombosis of unspecified deep veins of unspecified lower extremity: Secondary | ICD-10-CM

## 2013-07-22 DIAGNOSIS — C787 Secondary malignant neoplasm of liver and intrahepatic bile duct: Secondary | ICD-10-CM

## 2013-07-22 DIAGNOSIS — C786 Secondary malignant neoplasm of retroperitoneum and peritoneum: Secondary | ICD-10-CM

## 2013-07-22 DIAGNOSIS — C419 Malignant neoplasm of bone and articular cartilage, unspecified: Secondary | ICD-10-CM

## 2013-07-22 DIAGNOSIS — G893 Neoplasm related pain (acute) (chronic): Secondary | ICD-10-CM

## 2013-07-22 DIAGNOSIS — R630 Anorexia: Secondary | ICD-10-CM

## 2013-07-22 DIAGNOSIS — Z5111 Encounter for antineoplastic chemotherapy: Secondary | ICD-10-CM

## 2013-07-22 LAB — COMPREHENSIVE METABOLIC PANEL (CC13)
ALK PHOS: 425 U/L — AB (ref 40–150)
ALT: 36 U/L (ref 0–55)
AST: 30 U/L (ref 5–34)
Albumin: 2.1 g/dL — ABNORMAL LOW (ref 3.5–5.0)
Anion Gap: 9 mEq/L (ref 3–11)
BUN: 5.8 mg/dL — AB (ref 7.0–26.0)
CALCIUM: 8.2 mg/dL — AB (ref 8.4–10.4)
CO2: 23 mEq/L (ref 22–29)
CREATININE: 0.6 mg/dL (ref 0.6–1.1)
Chloride: 99 mEq/L (ref 98–109)
Glucose: 128 mg/dl (ref 70–140)
Potassium: 4.3 mEq/L (ref 3.5–5.1)
Sodium: 131 mEq/L — ABNORMAL LOW (ref 136–145)
Total Bilirubin: 0.63 mg/dL (ref 0.20–1.20)
Total Protein: 6.6 g/dL (ref 6.4–8.3)

## 2013-07-22 LAB — CBC WITH DIFFERENTIAL/PLATELET
BASO%: 0.5 % (ref 0.0–2.0)
Basophils Absolute: 0 10*3/uL (ref 0.0–0.1)
EOS%: 1.7 % (ref 0.0–7.0)
Eosinophils Absolute: 0.2 10*3/uL (ref 0.0–0.5)
HEMATOCRIT: 31.8 % — AB (ref 34.8–46.6)
HEMOGLOBIN: 10.9 g/dL — AB (ref 11.6–15.9)
LYMPH%: 8.8 % — ABNORMAL LOW (ref 14.0–49.7)
MCH: 28.9 pg (ref 25.1–34.0)
MCHC: 34.1 g/dL (ref 31.5–36.0)
MCV: 84.7 fL (ref 79.5–101.0)
MONO#: 1.3 10*3/uL — ABNORMAL HIGH (ref 0.1–0.9)
MONO%: 13.9 % (ref 0.0–14.0)
NEUT#: 7 10*3/uL — ABNORMAL HIGH (ref 1.5–6.5)
NEUT%: 75.1 % (ref 38.4–76.8)
PLATELETS: 478 10*3/uL — AB (ref 145–400)
RBC: 3.76 10*6/uL (ref 3.70–5.45)
RDW: 16.4 % — ABNORMAL HIGH (ref 11.2–14.5)
WBC: 9.4 10*3/uL (ref 3.9–10.3)
lymph#: 0.8 10*3/uL — ABNORMAL LOW (ref 0.9–3.3)

## 2013-07-22 LAB — TECHNOLOGIST REVIEW

## 2013-07-22 MED ORDER — SODIUM CHLORIDE 0.9 % IJ SOLN
10.0000 mL | INTRAMUSCULAR | Status: DC | PRN
  Administered 2013-07-22: 10 mL
  Filled 2013-07-22: qty 10

## 2013-07-22 MED ORDER — POTASSIUM CHLORIDE CRYS ER 20 MEQ PO TBCR: 20.0000 meq | EXTENDED_RELEASE_TABLET | Freq: Every day | ORAL | Status: AC

## 2013-07-22 MED ORDER — DEXAMETHASONE SODIUM PHOSPHATE 10 MG/ML IJ SOLN
10.0000 mg | Freq: Once | INTRAMUSCULAR | Status: AC
  Administered 2013-07-22: 10 mg via INTRAVENOUS

## 2013-07-22 MED ORDER — GEMCITABINE HCL CHEMO INJECTION 1 GM/26.3ML: 800.0000 mg/m2 | Freq: Once | INTRAVENOUS | Status: DC

## 2013-07-22 MED ORDER — ONDANSETRON 8 MG/50ML IVPB (CHCC)
8.0000 mg | Freq: Once | INTRAVENOUS | Status: AC
  Administered 2013-07-22: 8 mg via INTRAVENOUS

## 2013-07-22 MED ORDER — HEPARIN SOD (PORK) LOCK FLUSH 100 UNIT/ML IV SOLN
500.0000 [IU] | Freq: Once | INTRAVENOUS | Status: AC | PRN
  Administered 2013-07-22: 500 [IU]
  Filled 2013-07-22: qty 5

## 2013-07-22 MED ORDER — SODIUM CHLORIDE 0.9 % IV SOLN
800.0000 mg/m2 | Freq: Once | INTRAVENOUS | Status: AC
  Administered 2013-07-22: 1330 mg via INTRAVENOUS
  Filled 2013-07-22: qty 34.98

## 2013-07-22 MED ORDER — DEXAMETHASONE SODIUM PHOSPHATE 10 MG/ML IJ SOLN
INTRAMUSCULAR | Status: AC
  Filled 2013-07-22: qty 1

## 2013-07-22 MED ORDER — OXALIPLATIN CHEMO INJECTION 100 MG/20ML
80.0000 mg/m2 | Freq: Once | INTRAVENOUS | Status: AC
  Administered 2013-07-22: 135 mg via INTRAVENOUS
  Filled 2013-07-22: qty 27

## 2013-07-22 MED ORDER — OMEPRAZOLE 20 MG PO CPDR: 20.0000 mg | DELAYED_RELEASE_CAPSULE | Freq: Two times a day (BID) | ORAL | Status: AC

## 2013-07-22 MED ORDER — ONDANSETRON 8 MG/NS 50 ML IVPB
INTRAVENOUS | Status: AC
  Filled 2013-07-22: qty 8

## 2013-07-22 MED ORDER — DEXTROSE 5 % IV SOLN
Freq: Once | INTRAVENOUS | Status: AC
  Administered 2013-07-22: 13:00:00 via INTRAVENOUS

## 2013-07-22 NOTE — Progress Notes (Signed)
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  Patient continues to struggle with anorexia and poor oral intake.  She eats very small amounts at her mealtimes.  She no longer enjoys some of the oral nutrition supplements.  She has some ulcerations on her lower back requiring increased nutrition for healing.  Weight documented as 117 pounds, which is decreased from 128.75 pounds December 19.  Nutrition diagnosis: Food and nutrition related knowledge deficit continues. Inadequate oral intake related to metastatic cancer as evidenced by 9% weight loss in 3 weeks.  Intervention: Patient educated on strategies for changing the taste of oral nutrition supplements for increased tolerance.  Patient encouraged to take small sips of supplements throughout the day.  Provided increased ideas for increasing oral intake and focused on protein foods.  Oral nutrition supplement samples provided.  Monitoring, evaluation, goals: Patient will tolerate intake for weight stabilization.  Next visit: Monday, January 26, during chemotherapy.

## 2013-07-22 NOTE — Progress Notes (Signed)
Husband reports Curry may be developing pressure ulcer at sacrum. Have not obtained hospital bed at this time-leaning towards getting a lift chair instead. Encouraged them to be sure to get her to turn side-to-side to help decrease pressure on her sacrum. May require a Duoderm/Restore dressing and Geomatt chair cushion would be of benefit when she is in a chair.  Also needs 90 day supply for her K+ script and omeprazole 20 mg to continue to receive the reduced copay benefit from insurance company.

## 2013-07-22 NOTE — Telephone Encounter (Signed)
Per staff message and POF I have scheduled appts.  JMW  

## 2013-07-22 NOTE — Telephone Encounter (Signed)
gave pt appt for lab and ML, emailed Sharyn Lull regarding chemo for January and February 2015

## 2013-07-22 NOTE — Patient Instructions (Signed)
[facility] Discharge Instructions for Patients Receiving Chemotherapy  Today you received the following chemotherapy agents:  Gemzar, oxaliplatin  To help prevent nausea and vomiting after your treatment, we encourage you to take your nausea medication.  Take it as often as prescribed.     If you develop nausea and vomiting that is not controlled by your nausea medication, call the clinic. If it is after clinic hours your family physician or the after hours number for the clinic or go to the Emergency Department.   BELOW ARE SYMPTOMS THAT SHOULD BE REPORTED IMMEDIATELY:  *FEVER GREATER THAN 100.5 F  *CHILLS WITH OR WITHOUT FEVER  NAUSEA AND VOMITING THAT IS NOT CONTROLLED WITH YOUR NAUSEA MEDICATION  *UNUSUAL SHORTNESS OF BREATH  *UNUSUAL BRUISING OR BLEEDING  TENDERNESS IN MOUTH AND THROAT WITH OR WITHOUT PRESENCE OF ULCERS  *URINARY PROBLEMS  *BOWEL PROBLEMS  UNUSUAL RASH Items with * indicate a potential emergency and should be followed up as soon as possible.  Feel free to call the clinic you have any questions or concerns. The clinic phone number is [phone].   I have been informed and understand all the instructions given to me. I know to contact the clinic, my physician, or go to the Emergency Department if any problems should occur. I do not have any questions at this time, but understand that I may call the clinic during office hours   should I have any questions or need assistance in obtaining follow up care.    __________________________________________  _____________  __________ Signature of Patient or Authorized Representative            Date                   Time    __________________________________________ Nurse's Signature

## 2013-07-22 NOTE — Progress Notes (Signed)
[facility]    OFFICE PROGRESS NOTE   INTERVAL HISTORY:   [name] returns for scheduled followup of cholangiocarcinoma. She underwent placement of a biliary stent and exchange of a percutaneous biliary drain on 07/16/2013. The external drain is capped.  She completed a fourth treatment with gemcitabine/oxaliplatin on 07/08/2013. She also received Zometa that day for treatment of hypercalcemia. She reports cold sensitivity for several days following chemotherapy. No peripheral neuropathy symptoms at present. Her mental status has improved with correction of the elevated calcium.  Ms. Self continues to have pain at the right leg with standing. The pain is under better control with MS Contin at a dose of 100 mg. She is now maintained on Lovenox for treatment of a left leg DVT.  She has been more mobile over the past week. She continues to have anorexia. She has developed ulcerations at the lower back.   Objective:  Vital signs in last 24 hours:  Blood pressure 121/77, pulse 127, temperature 97.9 F (36.6 C), temperature source Oral, resp. rate 18, height 5' 4"  (1.626 m), weight 117 lb (53.071 kg), SpO2 97.00%.    HEENT: No thrush or ulcer Resp: Lungs clear bilaterally Cardio: Regular rate and rhythm, tachycardia GI: No hepatomegaly, biliary drain site with a gauze dressing Vascular: Trace edema at the left lower leg Neuro: Decreased strength with flexion at the right hip and extension at the right knee  Skin: Superficial skin breakdown at the lower thoracic spine and upper sacrum   Portacath/PICC-without erythema  Lab Results:  Lab Results  Component Value Date   WBC 9.4 07/22/2013   HGB 10.9* 07/22/2013   HCT 31.8* 07/22/2013   MCV 84.7 07/22/2013   PLT 478* 07/22/2013   NEUTROABS 7.0* 07/22/2013   Potassium 4.3, creatinine 0.6, alk phosphatase 425, albumin 2.1, bilirubin 0.63, calcium 8.2   Medications: I have reviewed the patient's current  medications.  Assessment/Plan: 1. 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 Staging chest CT 05/15/2013 showed a 1.3 cm soft tissue mass within the medial aspect of the right breast; 1.9 cm left supraclavicular lymph node; multiple enlarged mediastinal lymph nodes; 3 mm left upper lobe pulmonary nodule. Repeat CT abdomen and pelvis 05/15/2013 showed interval increase in the size of previously visualized lesions throughout the liver and multiple additional new lesions; bulky retroperitoneal lymphadenopathy was stable; extensive porta hepatis lymphadenopathy was redemonstrated; 7 mm soft tissue nodule in the subcutaneous fat of the anterior abdominal wall; 2.5 cm calcified lesion within the left hemipelvis; interval development of a pathologic compression fracture of the L3 vertebral body with extension of soft tissue components into the canal which was narrowed approximately 50%; metastatic involvement of the T9 vertebral body with small amount of extension into the vertebral canal; small amount of abnormal enhancing soft tissue at T7 with mild narrowing of the canal; destructive 2.6 cm lesion involving the lateral aspect of the left third rib. Palliative radiation to T9 and L3 on 05/21/2013  Cycle 1 gemcitabine/oxaliplatin on 05/22/2013  Cycle 2 gemcitabine/oxaliplatin on 06/05/2013  Cycle 3 gemcitabine/oxaliplatin on 06/19/2013  CT abdomen 06/28/2013 with improvement in the liver metastases and upper abdominal adenopathy, progression of and L3 compression fracture and a new left lateral rib lesion Cycle 4 gemcitabine/oxaliplatin 07/08/2013 2. Obstructive jaundice secondary to #1 status post placement of a percutaneous biliary drain on 05/02/2013. Cholangiogram 05/16/2013 showed the existing left internal/external biliary drain  was well positioned. The  biliary system was decompressed. The internal/external biliary drain was exchanged and a bile duct stent was placed on 07/15/2013. The jaundice has resolved.  3. Pain secondary to bone metastases involving the lumbar spine, retroperitoneal adenopathy. She continues to have pain at the right "hip ". The pain is likely related to the L3 compression fracture with nerve root compression She is taking MS Contin and MSIR. The pain has improved with an increased dose of MS Contin. Status post palliative radiation, one fraction, to the thoracic and lumbar spine metastases on 05/21/2013 4. Anorexia/weight loss secondary to #1 and pain 5. Subcutaneous lesion right scalp. Question lymph node. 6. Pain/tenderness at the left lower tibia-lytic lesion confirmed on a plain x-ray 05/24/2013, status post an intramedullary nail on 05/28/2013, radiation on 06/12/2013  7. admission with sepsis syndrome 06/28/2013-cultures negative, likely cholangitis, completed a course of Augmentin  8. Left lower extremity edema-A. Doppler on 07/10/2013 confirmed a left posterior tibial and popliteal DVT, maintained on Lovenox 9. Sacral and thoracic decubitus ulcers-we will make a referral for a home care skin nurse evaluation 10. Hypercalcemia of malignancy, status post Rodena Piety 07/08/2013-improved   Disposition:  She has completed 4 cycles of gemcitabine/oxaliplatin. Her overall performance status has improved. The hypercalcemia has resolved following Zometa. She will continue followup with interventional radiology for removal of the biliary drain as indicated.  We decided to proceed with cycle 5 gemcitabine/oxaliplatin today. We will consider a restaging CT after cycle 6 or cycle 7.  She will continue the MS Contin/MSIR for pain. She does not want to try an epidural steroid injection.  We will arrange for a home care of evaluation for physical therapy, a hospital bed, and skin care.   Betsy Coder, MD  07/22/2013  1:05  PM

## 2013-07-23 ENCOUNTER — Other Ambulatory Visit: Payer: Self-pay | Admitting: *Deleted

## 2013-07-23 ENCOUNTER — Telehealth: Payer: Self-pay | Admitting: *Deleted

## 2013-07-23 DIAGNOSIS — C7952 Secondary malignant neoplasm of bone marrow: Principal | ICD-10-CM

## 2013-07-23 DIAGNOSIS — C7951 Secondary malignant neoplasm of bone: Secondary | ICD-10-CM

## 2013-07-23 NOTE — Telephone Encounter (Signed)
Per Dr.Sherrill, order for lift chair placed.  Spoke with [facility] rep., Cyril Mourning, who received and will follow-through with order.

## 2013-07-24 ENCOUNTER — Telehealth: Payer: Self-pay | Admitting: Oncology

## 2013-07-24 NOTE — Telephone Encounter (Signed)
Called pt and left message regarding labs,md and chemo for January and February 2015

## 2013-07-25 ENCOUNTER — Other Ambulatory Visit (HOSPITAL_COMMUNITY): Payer: Self-pay | Admitting: Radiology

## 2013-07-31 ENCOUNTER — Other Ambulatory Visit (HOSPITAL_COMMUNITY): Payer: Self-pay | Admitting: Radiology

## 2013-07-31 ENCOUNTER — Other Ambulatory Visit: Payer: Self-pay | Admitting: *Deleted

## 2013-07-31 MED ORDER — MORPHINE SULFATE 30 MG PO TABS: 30.0000 mg | ORAL_TABLET | ORAL | Status: DC | PRN

## 2013-07-31 NOTE — Telephone Encounter (Signed)
Call from pt requesting refill on Morphine IR. Rx left in prescription book for pick up.

## 2013-08-01 ENCOUNTER — Other Ambulatory Visit (HOSPITAL_COMMUNITY): Payer: Self-pay | Admitting: Radiology

## 2013-08-01 ENCOUNTER — Ambulatory Visit (HOSPITAL_COMMUNITY)
Admission: RE | Admit: 2013-08-01 | Discharge: 2013-08-01 | Disposition: A | Discharge status: Home / Self Care | Payer: BC Managed Care – PPO | Source: Ambulatory Visit | Attending: Interventional Radiology | Admitting: Interventional Radiology

## 2013-08-01 ENCOUNTER — Other Ambulatory Visit (HOSPITAL_COMMUNITY): Payer: Self-pay | Admitting: Interventional Radiology

## 2013-08-01 DIAGNOSIS — C221 Intrahepatic bile duct carcinoma: Secondary | ICD-10-CM

## 2013-08-01 DIAGNOSIS — Z4689 Encounter for fitting and adjustment of other specified devices: Secondary | ICD-10-CM | POA: Diagnosis not present

## 2013-08-01 DIAGNOSIS — K838 Other specified diseases of biliary tract: Secondary | ICD-10-CM | POA: Insufficient documentation

## 2013-08-01 DIAGNOSIS — K7200 Acute and subacute hepatic failure without coma: Secondary | ICD-10-CM | POA: Insufficient documentation

## 2013-08-01 LAB — COMPREHENSIVE METABOLIC PANEL
ALT: 22 U/L (ref 0–35)
AST: 30 U/L (ref 0–37)
Albumin: 2.2 g/dL — ABNORMAL LOW (ref 3.5–5.2)
Alkaline Phosphatase: 405 U/L — ABNORMAL HIGH (ref 39–117)
BUN: 6 mg/dL (ref 6–23)
CALCIUM: 8.5 mg/dL (ref 8.4–10.5)
CO2: 25 mEq/L (ref 19–32)
Chloride: 96 mEq/L (ref 96–112)
Creatinine, Ser: 0.36 mg/dL — ABNORMAL LOW (ref 0.50–1.10)
GFR calc non Af Amer: >90 mL/min (ref >90)
GLUCOSE: 115 mg/dL — AB (ref 70–99)
Potassium: 4.3 mEq/L (ref 3.7–5.3)
Sodium: 133 mEq/L — ABNORMAL LOW (ref 137–147)
Total Bilirubin: 0.4 mg/dL (ref 0.3–1.2)
Total Protein: 7 g/dL (ref 6.0–8.3)

## 2013-08-01 MED ORDER — IOHEXOL 300 MG/ML  SOLN
50.0000 mL | Freq: Once | INTRAMUSCULAR | Status: AC | PRN
  Administered 2013-08-01: 25 mL

## 2013-08-04 ENCOUNTER — Other Ambulatory Visit: Payer: Self-pay | Admitting: Oncology

## 2013-08-05 ENCOUNTER — Telehealth: Payer: Self-pay | Admitting: *Deleted

## 2013-08-05 ENCOUNTER — Ambulatory Visit (HOSPITAL_BASED_OUTPATIENT_CLINIC_OR_DEPARTMENT_OTHER): Payer: BC Managed Care – PPO | Admitting: Nurse Practitioner

## 2013-08-05 ENCOUNTER — Ambulatory Visit (HOSPITAL_BASED_OUTPATIENT_CLINIC_OR_DEPARTMENT_OTHER): Payer: BC Managed Care – PPO

## 2013-08-05 ENCOUNTER — Other Ambulatory Visit (HOSPITAL_BASED_OUTPATIENT_CLINIC_OR_DEPARTMENT_OTHER): Payer: BC Managed Care – PPO

## 2013-08-05 ENCOUNTER — Ambulatory Visit: Payer: BC Managed Care – PPO | Admitting: Nutrition

## 2013-08-05 ENCOUNTER — Telehealth: Payer: Self-pay | Admitting: Oncology

## 2013-08-05 VITALS — BP 131/67 | HR 115 | Temp 97.8°F | Resp 18 | Ht 64.0 in | Wt 115.6 lb

## 2013-08-05 DIAGNOSIS — C786 Secondary malignant neoplasm of retroperitoneum and peritoneum: Secondary | ICD-10-CM

## 2013-08-05 DIAGNOSIS — C221 Intrahepatic bile duct carcinoma: Secondary | ICD-10-CM

## 2013-08-05 DIAGNOSIS — C7951 Secondary malignant neoplasm of bone: Secondary | ICD-10-CM

## 2013-08-05 DIAGNOSIS — C7952 Secondary malignant neoplasm of bone marrow: Principal | ICD-10-CM

## 2013-08-05 DIAGNOSIS — G893 Neoplasm related pain (acute) (chronic): Secondary | ICD-10-CM

## 2013-08-05 DIAGNOSIS — M25559 Pain in unspecified hip: Secondary | ICD-10-CM

## 2013-08-05 DIAGNOSIS — I824Y9 Acute embolism and thrombosis of unspecified deep veins of unspecified proximal lower extremity: Secondary | ICD-10-CM

## 2013-08-05 DIAGNOSIS — C787 Secondary malignant neoplasm of liver and intrahepatic bile duct: Secondary | ICD-10-CM

## 2013-08-05 DIAGNOSIS — Z5111 Encounter for antineoplastic chemotherapy: Secondary | ICD-10-CM

## 2013-08-05 DIAGNOSIS — R634 Abnormal weight loss: Secondary | ICD-10-CM

## 2013-08-05 DIAGNOSIS — I824Z9 Acute embolism and thrombosis of unspecified deep veins of unspecified distal lower extremity: Secondary | ICD-10-CM

## 2013-08-05 DIAGNOSIS — R630 Anorexia: Secondary | ICD-10-CM

## 2013-08-05 LAB — COMPREHENSIVE METABOLIC PANEL (CC13)
ALBUMIN: 2 g/dL — AB (ref 3.5–5.0)
ALK PHOS: 530 U/L — AB (ref 40–150)
ALT: 58 U/L — ABNORMAL HIGH (ref 0–55)
AST: 48 U/L — ABNORMAL HIGH (ref 5–34)
Anion Gap: 9 mEq/L (ref 3–11)
BUN: 5.1 mg/dL — AB (ref 7.0–26.0)
CO2: 23 mEq/L (ref 22–29)
CREATININE: 0.4 mg/dL — AB (ref 0.6–1.1)
Calcium: 8.1 mg/dL — ABNORMAL LOW (ref 8.4–10.4)
Chloride: 96 mEq/L — ABNORMAL LOW (ref 98–109)
GLUCOSE: 97 mg/dL (ref 70–140)
POTASSIUM: 4.2 meq/L (ref 3.5–5.1)
Sodium: 128 mEq/L — ABNORMAL LOW (ref 136–145)
Total Bilirubin: 0.54 mg/dL (ref 0.20–1.20)
Total Protein: 6.5 g/dL (ref 6.4–8.3)

## 2013-08-05 LAB — CBC WITH DIFFERENTIAL/PLATELET
BASO%: 0.3 % (ref 0.0–2.0)
Basophils Absolute: 0 10*3/uL (ref 0.0–0.1)
EOS%: 1.9 % (ref 0.0–7.0)
Eosinophils Absolute: 0.2 10*3/uL (ref 0.0–0.5)
HCT: 30.5 % — ABNORMAL LOW (ref 34.8–46.6)
HEMOGLOBIN: 10 g/dL — AB (ref 11.6–15.9)
LYMPH%: 7.9 % — AB (ref 14.0–49.7)
MCH: 27.6 pg (ref 25.1–34.0)
MCHC: 32.8 g/dL (ref 31.5–36.0)
MCV: 84.3 fL (ref 79.5–101.0)
MONO#: 1.5 10*3/uL — ABNORMAL HIGH (ref 0.1–0.9)
MONO%: 12.2 % (ref 0.0–14.0)
NEUT#: 9.7 10*3/uL — ABNORMAL HIGH (ref 1.5–6.5)
NEUT%: 77.7 % — AB (ref 38.4–76.8)
PLATELETS: 287 10*3/uL (ref 145–400)
RBC: 3.62 10*6/uL — ABNORMAL LOW (ref 3.70–5.45)
RDW: 15.8 % — ABNORMAL HIGH (ref 11.2–14.5)
WBC: 12.4 10*3/uL — ABNORMAL HIGH (ref 3.9–10.3)
lymph#: 1 10*3/uL (ref 0.9–3.3)
nRBC: 0 % (ref 0–0)

## 2013-08-05 LAB — TECHNOLOGIST REVIEW

## 2013-08-05 MED ORDER — DEXAMETHASONE SODIUM PHOSPHATE 10 MG/ML IJ SOLN
10.0000 mg | Freq: Once | INTRAMUSCULAR | Status: AC
  Administered 2013-08-05: 10 mg via INTRAVENOUS

## 2013-08-05 MED ORDER — HEPARIN SOD (PORK) LOCK FLUSH 100 UNIT/ML IV SOLN
500.0000 [IU] | Freq: Once | INTRAVENOUS | Status: AC | PRN
Start: 2013-08-05 — End: 2013-08-05
  Administered 2013-08-05: 500 [IU]
  Filled 2013-08-05: qty 5

## 2013-08-05 MED ORDER — OXALIPLATIN CHEMO INJECTION 100 MG/20ML
135.0000 mg | Freq: Once | INTRAVENOUS | Status: AC
  Administered 2013-08-05: 135 mg via INTRAVENOUS
  Filled 2013-08-05: qty 27

## 2013-08-05 MED ORDER — DEXTROSE 5 % IV SOLN
Freq: Once | INTRAVENOUS | Status: AC
  Administered 2013-08-05: 13:00:00 via INTRAVENOUS

## 2013-08-05 MED ORDER — DEXAMETHASONE SODIUM PHOSPHATE 10 MG/ML IJ SOLN
INTRAMUSCULAR | Status: AC
  Filled 2013-08-05: qty 1

## 2013-08-05 MED ORDER — SODIUM CHLORIDE 0.9 % IV SOLN: 800.0000 mg/m2 | Freq: Once | INTRAVENOUS | Status: DC

## 2013-08-05 MED ORDER — SODIUM CHLORIDE 0.9 % IV SOLN: 2500.0000 mg | Freq: Once | INTRAVENOUS | Status: DC

## 2013-08-05 MED ORDER — SODIUM CHLORIDE 0.9 % IJ SOLN
10.0000 mL | INTRAMUSCULAR | Status: DC | PRN
  Administered 2013-08-05: 10 mL
  Filled 2013-08-05: qty 10

## 2013-08-05 MED ORDER — ONDANSETRON 8 MG/NS 50 ML IVPB
INTRAVENOUS | Status: AC
  Filled 2013-08-05: qty 8

## 2013-08-05 MED ORDER — MORPHINE SULFATE ER 100 MG PO TBCR: 100.0000 mg | EXTENDED_RELEASE_TABLET | Freq: Two times a day (BID) | ORAL | Status: DC

## 2013-08-05 MED ORDER — ONDANSETRON 8 MG/50ML IVPB (CHCC)
8.0000 mg | Freq: Once | INTRAVENOUS | Status: AC
  Administered 2013-08-05: 8 mg via INTRAVENOUS

## 2013-08-05 MED ORDER — SODIUM CHLORIDE 0.9 % IV SOLN
1330.0000 mg | Freq: Once | INTRAVENOUS | Status: AC
  Administered 2013-08-05: 1330 mg via INTRAVENOUS
  Filled 2013-08-05: qty 34.98

## 2013-08-05 NOTE — Progress Notes (Signed)
Patient very disappointed that she has had weight loss.  Weight is down approximately 2 pounds last 2 weeks.  Patient does report skin breakdown has improved.  She has approximately one week after chemotherapy where she is unable to tolerate cold foods.  She reports inability to tolerate ENU anymore.  Nutrition diagnosis: Food and nutrition related knowledge deficit continues.  Diagnosis of inadequate oral intake continues.  Intervention: Patient educated on strategies for increasing calories and protein using warm oral nutrition supplement recipes.  I provided support and encouragement for patient to continue to work to increase oral intake in small amounts daily.  Provided patient with a fact sheet to take with her today and answered her questions.  Teach back method used.  Monitoring, evaluation, goals: Patient will tolerate increased calories and protein to minimize further weight loss.  Next visit: Monday, February 9, during chemotherapy.

## 2013-08-05 NOTE — Telephone Encounter (Signed)
Per staff message and POF I have scheduled appts.  JMW  

## 2013-08-05 NOTE — Progress Notes (Signed)
Discharged with spouse to home via wheelchair in no distress.

## 2013-08-05 NOTE — Progress Notes (Signed)
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 She completed cycle 5 gemcitabine/oxaliplatin on 07/22/2013. She had nausea for about 2 and half days following the chemotherapy. No vomiting. Home nausea medications relieved the nausea. She also had some mild nausea last week. She denies mouth sores. No diarrhea or constipation. She does note increased fatigue. Pain is "managed". She continues MS Contin 100 mg every 12 hours with MSIR every 4 hours as needed. Cold sensitivity lasted 3 days. She has intermittent numbness involving the right hand. She recently has felt that she cannot take as deep a breath as in the past. She does not feel short of breath. No cough or fever. Appetite continues to be poor.   Objective: Filed Vitals:   08/05/13 1131  BP: 131/67  Pulse: 115  Temp: 97.8 F (36.6 C)  Resp: 18   Oropharynx is without thrush or ulceration. Lungs are clear. Regular cardiac rhythm. Tachycardic. Port-A-Cath site is without erythema. Abdomen is soft. No hepatomegaly. Biliary drain site is covered with a gauze dressing. No leg edema. Calves nontender. Previously noted sites of superficial skin breakdown at the lower thoracic spine and upper sacrum are covered with dressings.   Lab Results: Lab Results  Component Value Date   WBC 12.4* 08/05/2013   HGB 10.0* 08/05/2013   HCT 30.5* 08/05/2013   MCV 84.3 08/05/2013   PLT 287 08/05/2013   NEUTROABS 9.7* 08/05/2013    Chemistry:    Chemistry      Component Value Date/Time   NA 133* 08/01/2013 1323   NA 131* 07/22/2013 1127   K 4.3 08/01/2013 1323   K 4.3 07/22/2013 1127   CL 96 08/01/2013 1323   CO2 25 08/01/2013 1323   CO2 23 07/22/2013 1127   BUN 6 08/01/2013 1323   BUN 5.8* 07/22/2013 1127   CREATININE 0.36* 08/01/2013 1323   CREATININE 0.6 07/22/2013 1127      Component Value Date/Time   CALCIUM 8.5 08/01/2013 1323   CALCIUM 8.2* 07/22/2013 1127   ALKPHOS 405* 08/01/2013 1323   ALKPHOS 425*  07/22/2013 1127   AST 30 08/01/2013 1323   AST 30 07/22/2013 1127   ALT 22 08/01/2013 1323   ALT 36 07/22/2013 1127   BILITOT 0.4 08/01/2013 1323   BILITOT 0.63 07/22/2013 1127       Studies/Results: Ir Cholan Exist Tube  08/01/2013   CLINICAL DATA:  63 year old female with malignant obstructed jaundice secondary to cholangiocarcinoma. A left-sided percutaneous biliary drain was placed on May 02, 2013. The patient experience continued leakage around the tube. Therefore, an internal biliary stent was placed on 07/16/2013. The patient returns today for her first check following stent placement. She has had an internal/external biliary drain which extends through the stent and into the duodenum since the time of the initial stent placement. She has had no issues with the drainage and that the tube has remain capped throughout the duration. Evaluation of her labs today demonstrates a normal serum Burnadette Peter which is the lowest it is been since prior to her PTC procedure. Additionally, she is happy to report no further leakage from the tube site. She is very pleased.  EXAM: CHOLANGIOGRAM VIA EXISTING CATHETER; IR CATHETER TUBE CHANGE  Date: 08/01/2013  TECHNIQUE: Informed consent was obtained from the patient following explanation of the procedure, risks, benefits and alternatives. The patient understands, agrees and consents for the procedure. All questions were addressed. A time out was performed.  Maximal barrier  sterile technique utilized including caps, mask, sterile gowns, sterile gloves, large sterile drape, hand hygiene, and Betadine skin prep.  The retaining suture was cut and a wire advanced through the existing internal/external biliary drain. The biliary drain was removed over the wire. An 8 French sheath was advanced into the left biliary tree. Through the sheath, an over-the-wire cholangiogram was performed. The cholangiogram demonstrates excellent patency of the previously placed covered biliary  stent. The biliary stent has continue to expand since the prior placement and is now fully expanded at 10 mm. There is relatively rapid drainage of injected contrast through the stent. Mild residual but improved intrahepatic biliary ductal dilatation. The intrahepatic bile ducts around the high confluence remain irregular suggesting mild external compression related to the patient's cholangiocarcinoma.  A new 10 French cook [employer] tube was then carefully advanced over the wire and the coil formed in the confluence of the hepatic ducts above the metallic stent. A gentle an injection of contrast confirms continued rapid drainage throughout the mid metal stent. The tube was left capped.  ANESTHESIA/SEDATION: None required.  CONTRAST:  15 cc Omnipaque 300  FLUOROSCOPY TIME:  1 min 24 seconds  PROCEDURE: 1. Over the wire cholangiogram through existing access site 2. Removal of internal/external biliary drain 3. Placement of an external biliary drain above the metallic stent Interventional Radiologist:  Criselda Peaches, MD  IMPRESSION: 1. Percutaneous cholangiogram demonstrates a Foley patent and fully expanded covered metallic biliary stent. Residual mild intrahepatic biliary ductal dilatation and irregular of the bile ducts around the intrahepatic confluence consistent with the patient's known locally advanced cholangiocarcinoma. 2. Removal of the internal/external biliary drain and replacement of an external 10 French Dawson Mueller tube. This tube was left capped.  PLAN: The patient is to undergo another round of chemotherapy next week followed by a repeat CT scan in approximately 3 weeks. After this, she will return to Interventional Radiology for a repeat cholangiogram and possible removal of her tube at that time.  Signed,  Criselda Peaches, MD  [facility]  [facility]   Electronically Signed   By: Jacqulynn Cadet M.D.   On: 08/01/2013 17:56   Ir  Cholan Exist Tube  07/16/2013   CLINICAL DATA:  63 year old female with cholangiocarcinoma and malignant biliary obstruction.  EXAM: IR CATHETER TUBE CHANGE; CHOLANGIOGRAM VIA EXISTING CATHETER; BILIARY DILATION  Date: 07/16/2013  TECHNIQUE: Informed consent was obtained from the patient following explanation of the procedure, risks, benefits and alternatives. The patient understands, agrees and consents for the procedure. All questions were addressed. A time out was performed.  3.375 g Zosyn administered intravenously within 1 hr of skin incision.  Maximal barrier sterile technique utilized including caps, mask, sterile gowns, sterile gloves, large sterile drape, hand hygiene, and Betadine skin prep. The existing left-sided 49 French percutaneous biliary drain was transected. A wire was advanced into the duodenum and the tube removed over the wire. A 9 French hydrophilic vascular sheath was then advanced over the wire and positioned within the duodenum. A pull-back over-the-wire cholangiogram was then performed. There is persistent intra and extrahepatic biliary ductal dilatation with abrupt cut off of the mid common bile duct. 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 Additional cholangiogram images were obtained in multiple obliquities to identify the insertion of the right and left hepatic ducts into the common bile duct. The length of the stenosis was measured at  approximately 5.4 cm. Of note, during the cholangiogram it was noted that there is some irregular narrowing of the transverse (D3) segment of the duodenum concerning for external compression. Additionally, contrast material injected into the D2 and proximal D3 portions of the duodenum refluxes into the stomach.  The sheath was then advanced over the wire and into the proximal small bowel. A 10 x 600 mm WalFlex covered, self expanding stent was then positioned across the stenosis and  deployed. The distal end of the stent is within the descending duodenum and the proximal aspect of the stent in the common bile duct below the confluence of the left and right hepatic ducts. Following initial deployment, there was still poor drainage of contrast material through the stent which remained compressed secondary to mass effect. Therefore, a 10 x 40 mm Mustang balloon was placed into the center of the stent and gently inflated to a maximum of 10 mmHg pressure. Following deflation of the balloon there was prompt drainage of the intrahepatic bile ducts. A Cook 12 [place] biliary drain was then modified with approximately 2-3 cm of additional sideholes and advanced over the wire and positioned within the duodenum. The final image demonstrates the tip of a Bentson wire at the location of the most proximal side hole.  The tube was connected to gravity bag drainage and secured to the skin with 0 Prolene suture. Overall, the patient tolerated the procedure very well.  ANESTHESIA/SEDATION: Moderate (conscious) sedation was used. Six mg Versed, 150 mcg Fentanyl were administered intravenously. The patient's vital signs were monitored continuously by radiology nursing throughout the procedure.  Sedation Time: 60 minutes  CONTRAST:  60 mm Omnipaque 300 administered into the biliary tree and duodenum.  FLUOROSCOPY TIME:  17 minutes 8 seconds  PROCEDURE: 1. Percutaneous cholangiogram through existing tube in 2. Placement of a self expanding covered metallic biliary stent 3. Post stent dilatation angioplasty 4. Exchange of modified 12 French percutaneous biliary drain Interventional Radiologist:  Criselda Peaches, MD  IMPRESSION: 1. Successful placement of a covered a self expanding stent across the malignant biliary stricture. 2. Cholangiogram demonstrates findings concerning for developing external malignant compression of the transverse (D3) segment of the duodenum with associated reflux of contrast material into  the stomach. 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 3. Exchange for a new modified (additional side holes) 12 French percutaneous internal/external biliary drain. The biliary drain will be left to gravity drainage for 24 hr and the patient will cap the tube at home. Clinical Plan: The patient will return to interventional radiology in 1-2 weeks for biliary tube check. If the metallic stent remains patent, and the patient is clinically asymptomatic, the drain can be exchanged for an external drain left in place above the biliary stent. If a 2nd capped trial is successful for an additional week, the drain can likely be removed completely.  The above findings and plan were discussed in detail with the patient, her husband and her Oncologist, Dr. Benay Spice.  Signed,  Criselda Peaches, MD  [facility]  [facility]   Electronically Signed   By: Jacqulynn Cadet M.D.   On: 07/16/2013 16:38   Ir Biliary Dilitation  07/16/2013   CLINICAL DATA:  63 year old female with cholangiocarcinoma and malignant biliary obstruction.  EXAM: IR CATHETER TUBE CHANGE; CHOLANGIOGRAM VIA EXISTING CATHETER; BILIARY DILATION  Date: 07/16/2013  TECHNIQUE: Informed consent was obtained from the patient following explanation of the procedure, risks, benefits  and alternatives. The patient understands, agrees and consents for the procedure. All questions were addressed. A time out was performed.  3.375 g Zosyn administered intravenously within 1 hr of skin incision.  Maximal barrier sterile technique utilized including caps, mask, sterile gowns, sterile gloves, large sterile drape, hand hygiene, and Betadine skin prep. The existing left-sided 80 French percutaneous biliary drain was transected. A wire was advanced into the duodenum and the tube removed over the wire. A 9 French hydrophilic vascular sheath was then advanced over the wire and positioned  within the duodenum. A pull-back over-the-wire cholangiogram was then performed. There is persistent intra and extrahepatic biliary ductal dilatation with abrupt cut off of the mid common bile duct. 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 Additional cholangiogram images were obtained in multiple obliquities to identify the insertion of the right and left hepatic ducts into the common bile duct. The length of the stenosis was measured at approximately 5.4 cm. Of note, during the cholangiogram it was noted that there is some irregular narrowing of the transverse (D3) segment of the duodenum concerning for external compression. Additionally, contrast material injected into the D2 and proximal D3 portions of the duodenum refluxes into the stomach.  The sheath was then advanced over the wire and into the proximal small bowel. A 10 x 600 mm WalFlex covered, self expanding stent was then positioned across the stenosis and deployed. The distal end of the stent is within the descending duodenum and the proximal aspect of the stent in the common bile duct below the confluence of the left and right hepatic ducts. Following initial deployment, there was still poor drainage of contrast material through the stent which remained compressed secondary to mass effect. Therefore, a 10 x 40 mm Mustang balloon was placed into the center of the stent and gently inflated to a maximum of 10 mmHg pressure. Following deflation of the balloon there was prompt drainage of the intrahepatic bile ducts. A Cook 12 [place] biliary drain was then modified with approximately 2-3 cm of additional sideholes and advanced over the wire and positioned within the duodenum. The final image demonstrates the tip of a Bentson wire at the location of the most proximal side hole.  The tube was connected to gravity bag drainage and secured to the skin with 0 Prolene suture. Overall, the patient  tolerated the procedure very well.  ANESTHESIA/SEDATION: Moderate (conscious) sedation was used. Six mg Versed, 150 mcg Fentanyl were administered intravenously. The patient's vital signs were monitored continuously by radiology nursing throughout the procedure.  Sedation Time: 60 minutes  CONTRAST:  60 mm Omnipaque 300 administered into the biliary tree and duodenum.  FLUOROSCOPY TIME:  17 minutes 8 seconds  PROCEDURE: 1. Percutaneous cholangiogram through existing tube in 2. Placement of a self expanding covered metallic biliary stent 3. Post stent dilatation angioplasty 4. Exchange of modified 12 French percutaneous biliary drain Interventional Radiologist:  Criselda Peaches, MD  IMPRESSION: 1. Successful placement of a covered a self expanding stent across the malignant biliary stricture. 2. Cholangiogram demonstrates findings concerning for developing external malignant compression of the transverse (D3) segment of the duodenum with associated reflux of contrast material into the stomach. 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 3. Exchange for a new modified (additional side holes) 12 French percutaneous internal/external biliary drain. The biliary drain will be left to gravity drainage for 24 hr and the  patient will cap the tube at home. Clinical Plan: The patient will return to interventional radiology in 1-2 weeks for biliary tube check. If the metallic stent remains patent, and the patient is clinically asymptomatic, the drain can be exchanged for an external drain left in place above the biliary stent. If a 2nd capped trial is successful for an additional week, the drain can likely be removed completely.  The above findings and plan were discussed in detail with the patient, her husband and her Oncologist, Dr. Benay Spice.  Signed,  Criselda Peaches, MD  [facility]  [facility] Radiology   Electronically Signed   By:  Jacqulynn Cadet M.D.   On: 07/16/2013 16:38   Ir Catheter Tube Change  08/01/2013   CLINICAL DATA:  63 year old female with malignant obstructed jaundice secondary to cholangiocarcinoma. A left-sided percutaneous biliary drain was placed on May 02, 2013. The patient experience continued leakage around the tube. Therefore, an internal biliary stent was placed on 07/16/2013. The patient returns today for her first check following stent placement. She has had an internal/external biliary drain which extends through the stent and into the duodenum since the time of the initial stent placement. She has had no issues with the drainage and that the tube has remain capped throughout the duration. Evaluation of her labs today demonstrates a normal serum Burnadette Peter which is the lowest it is been since prior to her PTC procedure. Additionally, she is happy to report no further leakage from the tube site. She is very pleased.  EXAM: CHOLANGIOGRAM VIA EXISTING CATHETER; IR CATHETER TUBE CHANGE  Date: 08/01/2013  TECHNIQUE: Informed consent was obtained from the patient following explanation of the procedure, risks, benefits and alternatives. The patient understands, agrees and consents for the procedure. All questions were addressed. A time out was performed.  Maximal barrier sterile technique utilized including caps, mask, sterile gowns, sterile gloves, large sterile drape, hand hygiene, and Betadine skin prep.  The retaining suture was cut and a wire advanced through the existing internal/external biliary drain. The biliary drain was removed over the wire. An 8 French sheath was advanced into the left biliary tree. Through the sheath, an over-the-wire cholangiogram was performed. The cholangiogram demonstrates excellent patency of the previously placed covered biliary stent. The biliary stent has continue to expand since the prior placement and is now fully expanded at 10 mm. There is relatively rapid drainage of  injected contrast through the stent. Mild residual but improved intrahepatic biliary ductal dilatation. The intrahepatic bile ducts around the high confluence remain irregular suggesting mild external compression related to the patient's cholangiocarcinoma.  A new 10 French cook [employer] tube was then carefully advanced over the wire and the coil formed in the confluence of the hepatic ducts above the metallic stent. A gentle an injection of contrast confirms continued rapid drainage throughout the mid metal stent. The tube was left capped.  ANESTHESIA/SEDATION: None required.  CONTRAST:  15 cc Omnipaque 300  FLUOROSCOPY TIME:  1 min 24 seconds  PROCEDURE: 1. Over the wire cholangiogram through existing access site 2. Removal of internal/external biliary drain 3. Placement of an external biliary drain above the metallic stent Interventional Radiologist:  Criselda Peaches, MD  IMPRESSION: 1. Percutaneous cholangiogram demonstrates a Foley patent and fully expanded covered metallic biliary stent. Residual mild intrahepatic biliary ductal dilatation and irregular of the bile ducts around the intrahepatic confluence consistent with the patient's known locally advanced cholangiocarcinoma. 2. Removal of the internal/external  biliary drain and replacement of an external 10 French Dawson Mueller tube. This tube was left capped.  PLAN: The patient is to undergo another round of chemotherapy next week followed by a repeat CT scan in approximately 3 weeks. After this, she will return to Interventional Radiology for a repeat cholangiogram and possible removal of her tube at that time.  Signed,  Criselda Peaches, MD  [facility]  [facility]   Electronically Signed   By: Jacqulynn Cadet M.D.   On: 08/01/2013 17:56   Ir Catheter Tube Change  07/16/2013   CLINICAL DATA:  63 year old female with cholangiocarcinoma and malignant biliary obstruction.  EXAM: IR CATHETER TUBE  CHANGE; CHOLANGIOGRAM VIA EXISTING CATHETER; BILIARY DILATION  Date: 07/16/2013  TECHNIQUE: Informed consent was obtained from the patient following explanation of the procedure, risks, benefits and alternatives. The patient understands, agrees and consents for the procedure. All questions were addressed. A time out was performed.  3.375 g Zosyn administered intravenously within 1 hr of skin incision.  Maximal barrier sterile technique utilized including caps, mask, sterile gowns, sterile gloves, large sterile drape, hand hygiene, and Betadine skin prep. The existing left-sided 41 French percutaneous biliary drain was transected. A wire was advanced into the duodenum and the tube removed over the wire. A 9 French hydrophilic vascular sheath was then advanced over the wire and positioned within the duodenum. A pull-back over-the-wire cholangiogram was then performed. There is persistent intra and extrahepatic biliary ductal dilatation with abrupt cut off of the mid common bile duct. 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 Additional cholangiogram images were obtained in multiple obliquities to identify the insertion of the right and left hepatic ducts into the common bile duct. The length of the stenosis was measured at approximately 5.4 cm. Of note, during the cholangiogram it was noted that there is some irregular narrowing of the transverse (D3) segment of the duodenum concerning for external compression. Additionally, contrast material injected into the D2 and proximal D3 portions of the duodenum refluxes into the stomach.  The sheath was then advanced over the wire and into the proximal small bowel. A 10 x 600 mm WalFlex covered, self expanding stent was then positioned across the stenosis and deployed. The distal end of the stent is within the descending duodenum and the proximal aspect of the stent in the common bile duct below the confluence  of the left and right hepatic ducts. Following initial deployment, there was still poor drainage of contrast material through the stent which remained compressed secondary to mass effect. Therefore, a 10 x 40 mm Mustang balloon was placed into the center of the stent and gently inflated to a maximum of 10 mmHg pressure. Following deflation of the balloon there was prompt drainage of the intrahepatic bile ducts. A Cook 12 [place] biliary drain was then modified with approximately 2-3 cm of additional sideholes and advanced over the wire and positioned within the duodenum. The final image demonstrates the tip of a Bentson wire at the location of the most proximal side hole.  The tube was connected to gravity bag drainage and secured to the skin with 0 Prolene suture. Overall, the patient tolerated the procedure very well.  ANESTHESIA/SEDATION: Moderate (conscious) sedation was used. Six mg Versed, 150 mcg Fentanyl were administered intravenously. The patient's vital signs were monitored continuously by radiology nursing throughout the procedure.  Sedation Time: 60 minutes  CONTRAST:  60 mm Omnipaque 300 administered into the biliary tree and duodenum.  FLUOROSCOPY TIME:  17 minutes 8 seconds  PROCEDURE: 1. Percutaneous cholangiogram through existing tube in 2. Placement of a self expanding covered metallic biliary stent 3. Post stent dilatation angioplasty 4. Exchange of modified 12 French percutaneous biliary drain Interventional Radiologist:  Criselda Peaches, MD  IMPRESSION: 1. Successful placement of a covered a self expanding stent across the malignant biliary stricture. 2. Cholangiogram demonstrates findings concerning for developing external malignant compression of the transverse (D3) segment of the duodenum with associated reflux of contrast material into the stomach. 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 3. Exchange for a new modified (additional side  holes) 12 French percutaneous internal/external biliary drain. The biliary drain will be left to gravity drainage for 24 hr and the patient will cap the tube at home. Clinical Plan: The patient will return to interventional radiology in 1-2 weeks for biliary tube check. If the metallic stent remains patent, and the patient is clinically asymptomatic, the drain can be exchanged for an external drain left in place above the biliary stent. If a 2nd capped trial is successful for an additional week, the drain can likely be removed completely.  The above findings and plan were discussed in detail with the patient, her husband and her Oncologist, Dr. Benay Spice.  Signed,  Criselda Peaches, MD  [facility]  [facility]   Electronically Signed   By: Jacqulynn Cadet M.D.   On: 07/16/2013 16:38   Ir Catheter Tube Change  07/10/2013   CLINICAL DATA:  63 year old with cholangiocarcinoma and biliary drain. Recently, bile was draining around the catheter and, therefore, the catheter was attached to a gravity bag. Catheter needs to be evaluated for partial occlusion and exchange.  EXAM: EXCHANGE OF BILIARY DRAIN WITH FLUOROSCOPY  Physician: Stephan Minister. Anselm Pancoast, MD  MEDICATIONS: Antibiotics were not given because the patient is been taking p.o. antibiotics.  ANESTHESIA/SEDATION: Moderate sedation time: None  FLUOROSCOPY TIME:  2 min and 30 seconds  PROCEDURE: The procedure was explained to the patient. The risks and benefits of the procedure were discussed and the patient's questions were addressed. Informed consent was obtained from the patient. The left biliary catheter and surrounding skin were prepped and draped in sterile fashion. Maximal barrier sterile technique was utilized including caps, mask, sterile gowns, sterile gloves, sterile drape, hand hygiene and skin antiseptic. Contrast was injected into the biliary catheter. Catheter was cut and removed over a stiff Glidewire. A new  12 [place] biliary catheter was advanced over the stiff Glidewire and the distal aspect was reconstituted in the duodenum. Contrast was injected to confirm patency. The catheter was flushed with normal saline and sutured to the skin. Catheter was capped at the end of the procedure.  COMPLICATIONS: None  FINDINGS: The left internal/external biliary catheter was well positioned in the left biliary system and extended into the duodenum. Contrast injection demonstrated filling of the intrahepatic biliary system but no filling of the distal catheter or duodenum. Findings consistent with a distal catheter occlusion. Occlusion of the distal catheter was also confirmed after the catheter was removed. New biliary drain was placed and the distal aspect was reconstituted in the bowel. The entire catheter was patent at the end of the procedure.  IMPRESSION: The biliary drainage catheter was partially occluded and explains the bile leakage around the catheter. A new catheter was successfully placed. The patient's family has been instructed  to flush the catheter daily with sterile saline. The catheter was capped.   Electronically Signed   By: Markus Daft M.D.   On: 07/10/2013 16:05    Medications: I have reviewed the patient's current medications.  Assessment/Plan: 1. 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 Staging chest CT 05/15/2013 showed a 1.3 cm soft tissue mass within the medial aspect of the right breast; 1.9 cm left supraclavicular lymph node; multiple enlarged mediastinal lymph nodes; 3 mm left upper lobe pulmonary nodule. Repeat CT abdomen and pelvis 05/15/2013 showed interval increase in the size of previously visualized lesions throughout the liver and multiple additional new lesions; bulky retroperitoneal lymphadenopathy was stable; extensive porta hepatis  lymphadenopathy was redemonstrated; 7 mm soft tissue nodule in the subcutaneous fat of the anterior abdominal wall; 2.5 cm calcified lesion within the left hemipelvis; interval development of a pathologic compression fracture of the L3 vertebral body with extension of soft tissue components into the canal which was narrowed approximately 50%; metastatic involvement of the T9 vertebral body with small amount of extension into the vertebral canal; small amount of abnormal enhancing soft tissue at T7 with mild narrowing of the canal; destructive 2.6 cm lesion involving the lateral aspect of the left third rib. Palliative radiation to T9 and L3 on 05/21/2013  Cycle 1 gemcitabine/oxaliplatin on 05/22/2013  Cycle 2 gemcitabine/oxaliplatin on 06/05/2013  Cycle 3 gemcitabine/oxaliplatin on 06/19/2013  CT abdomen 06/28/2013 with improvement in the liver metastases and upper abdominal adenopathy, progression of and L3 compression fracture and a new left lateral rib lesion  Cycle 4 gemcitabine/oxaliplatin 07/08/2013. Cycle 5 gemcitabine/oxaliplatin 07/22/2013. 2. Obstructive jaundice secondary to #1 status post placement of a percutaneous biliary drain on 05/02/2013. Cholangiogram 05/16/2013 showed the existing left internal/external biliary drain was well positioned. The biliary system was decompressed. The internal/external biliary drain was exchanged and a bile duct stent was placed on 07/15/2013. The jaundice has resolved. Removal of internal/external biliary drain 08/01/2013 with placement of an external biliary drain above the metallic stent. The tube was left capped. 3. Pain secondary to bone metastases involving the lumbar spine, retroperitoneal adenopathy. She continues to have pain at the right "hip ". The pain is likely related to the L3 compression fracture with nerve root compression She is taking MS Contin and MSIR. The pain has improved with an increased dose of MS Contin. Status post palliative  radiation, one fraction, to the thoracic and lumbar spine metastases on 05/21/2013 4. Anorexia/weight loss secondary to #1 and pain 5. Subcutaneous lesion right scalp. Question lymph node. 6. Pain/tenderness at the left lower tibia. Lytic lesion confirmed on a plain x-ray 05/24/2013. Status post intramedullary nail on 05/28/2013, radiation on 06/12/2013. 7. Admission with sepsis syndrome 06/28/2013. Cultures negative. Likely cholangitis. Completed a course of Augmentin. 8. Left lower extremity edema. Doppler on 07/10/2013 confirmed a left posterior tibial and popliteal DVT. She is maintained on Lovenox. 9. Sacral and thoracic decubitus ulcers. She is now followed by a home skin care nurse. 10. Hypercalcemia of malignancy status post Zometa 06/28/2013. Improved.  Dispositon-she appears stable. She has completed 5 cycles of gemcitabine/oxaliplatin. We will followup on the CEA from today. Plan to proceed with cycle 6 today as scheduled. She will return for a followup visit and cycle 7 in 2 weeks. She will contact the office in the interim with any problems.  Plan reviewed with Dr. Benay Spice.   Ned Card ANP/GNP-BC

## 2013-08-05 NOTE — Patient Instructions (Signed)
[facility] Discharge Instructions for Patients Receiving Chemotherapy  Today you received the following chemotherapy agents Gemzar/Oxaliplatin.  To help prevent nausea and vomiting after your treatment, we encourage you to take your nausea medication zofran 4 mg, compazine 10 mg as ordered by Dr. Benay Spice.   If you develop nausea and vomiting that is not controlled by your nausea medication, call the clinic.   BELOW ARE SYMPTOMS THAT SHOULD BE REPORTED IMMEDIATELY:  *FEVER GREATER THAN 100.5 F  *CHILLS WITH OR WITHOUT FEVER  NAUSEA AND VOMITING THAT IS NOT CONTROLLED WITH YOUR NAUSEA MEDICATION  *UNUSUAL SHORTNESS OF BREATH  *UNUSUAL BRUISING OR BLEEDING  TENDERNESS IN MOUTH AND THROAT WITH OR WITHOUT PRESENCE OF ULCERS  *URINARY PROBLEMS  *BOWEL PROBLEMS  UNUSUAL RASH Items with * indicate a potential emergency and should be followed up as soon as possible.  Feel free to call the clinic you have any questions or concerns. The clinic phone number is [phone].

## 2013-08-05 NOTE — Telephone Encounter (Signed)
Gave pt appt for lab and MD for February and emailed [place] regarding chemo

## 2013-08-06 LAB — CEA: CEA: 30.2 ng/mL — AB (ref 0.0–5.0)

## 2013-08-07 ENCOUNTER — Telehealth: Payer: Self-pay | Admitting: Oncology

## 2013-08-07 ENCOUNTER — Telehealth: Payer: Self-pay | Admitting: *Deleted

## 2013-08-07 NOTE — Telephone Encounter (Signed)
gave pt appt for lab,Md and chemo for [facility] 2015

## 2013-08-07 NOTE — Telephone Encounter (Signed)
Called pt with CEA results. 30.2. Per Dr. Benay Spice: CEA is not the best indicator in this case. Doing well clinically and calcium has been normal. Will push forward. Pt voiced understanding.

## 2013-08-08 ENCOUNTER — Telehealth: Payer: Self-pay | Admitting: *Deleted

## 2013-08-08 NOTE — Telephone Encounter (Signed)
Small skin tear on back near the stage ll decubitus she has when husband removed the hydrocolloid dressing. They have cut the next dressing smaller to avoid tear. Stage ll decubitus sacrum is almost healed, so they are not using the hydrocolloid dressing. However there are #2 small skin tears about 0.5 cm in size. Husband is just putting a lotion on to protect it. Is in gluteal fold, so difficult to put on a dressing. Does MD want to try tegaderm? This RN suggested they try using A & D Ointment instead of lotion for better protection.

## 2013-08-13 ENCOUNTER — Other Ambulatory Visit: Payer: Self-pay | Admitting: *Deleted

## 2013-08-13 MED ORDER — MORPHINE SULFATE 30 MG PO TABS: 30.0000 mg | ORAL_TABLET | ORAL | Status: DC | PRN

## 2013-08-13 NOTE — Telephone Encounter (Signed)
Message from pt requesting refill on MSIR. Rx left in prescription book for pick up.

## 2013-08-15 ENCOUNTER — Telehealth: Payer: Self-pay | Admitting: *Deleted

## 2013-08-15 NOTE — Telephone Encounter (Signed)
Needs orders to continue to visit weekly. Only has one visit left. Also the hydrocolloid dressing on her back is causing more skin irritation than it is helping. Asking for alternative orders. Instructed nurse to have [facility] nurse evaluate and advise. Gave verbal for 2 more weeks of visits. She also reports some mild nausea today-took Zofran twice. Will call if not better.

## 2013-08-18 ENCOUNTER — Other Ambulatory Visit: Payer: Self-pay | Admitting: Oncology

## 2013-08-19 ENCOUNTER — Other Ambulatory Visit (HOSPITAL_BASED_OUTPATIENT_CLINIC_OR_DEPARTMENT_OTHER): Payer: BC Managed Care – PPO

## 2013-08-19 ENCOUNTER — Ambulatory Visit: Payer: BC Managed Care – PPO | Admitting: Nutrition

## 2013-08-19 ENCOUNTER — Ambulatory Visit (HOSPITAL_BASED_OUTPATIENT_CLINIC_OR_DEPARTMENT_OTHER): Payer: BC Managed Care – PPO | Admitting: Nurse Practitioner

## 2013-08-19 ENCOUNTER — Other Ambulatory Visit: Payer: Self-pay | Admitting: Oncology

## 2013-08-19 ENCOUNTER — Encounter (HOSPITAL_COMMUNITY): Payer: Self-pay

## 2013-08-19 ENCOUNTER — Ambulatory Visit (HOSPITAL_BASED_OUTPATIENT_CLINIC_OR_DEPARTMENT_OTHER): Payer: BC Managed Care – PPO

## 2013-08-19 ENCOUNTER — Telehealth: Payer: Self-pay | Admitting: Oncology

## 2013-08-19 ENCOUNTER — Ambulatory Visit (HOSPITAL_COMMUNITY)
Admission: RE | Admit: 2013-08-19 | Discharge: 2013-08-19 | Disposition: A | Discharge status: Home / Self Care | Payer: BC Managed Care – PPO | Source: Ambulatory Visit | Attending: Nurse Practitioner | Admitting: Nurse Practitioner

## 2013-08-19 VITALS — BP 110/75 | HR 74 | Temp 97.9°F | Resp 18

## 2013-08-19 VITALS — BP 122/76 | HR 130 | Temp 97.7°F | Resp 20 | Ht 64.0 in | Wt 111.3 lb

## 2013-08-19 DIAGNOSIS — C221 Intrahepatic bile duct carcinoma: Secondary | ICD-10-CM

## 2013-08-19 DIAGNOSIS — C7951 Secondary malignant neoplasm of bone: Secondary | ICD-10-CM

## 2013-08-19 DIAGNOSIS — C787 Secondary malignant neoplasm of liver and intrahepatic bile duct: Secondary | ICD-10-CM

## 2013-08-19 DIAGNOSIS — C419 Malignant neoplasm of bone and articular cartilage, unspecified: Secondary | ICD-10-CM

## 2013-08-19 DIAGNOSIS — C786 Secondary malignant neoplasm of retroperitoneum and peritoneum: Secondary | ICD-10-CM

## 2013-08-19 DIAGNOSIS — I82409 Acute embolism and thrombosis of unspecified deep veins of unspecified lower extremity: Secondary | ICD-10-CM

## 2013-08-19 DIAGNOSIS — G893 Neoplasm related pain (acute) (chronic): Secondary | ICD-10-CM

## 2013-08-19 DIAGNOSIS — C7952 Secondary malignant neoplasm of bone marrow: Principal | ICD-10-CM

## 2013-08-19 DIAGNOSIS — R6889 Other general symptoms and signs: Secondary | ICD-10-CM

## 2013-08-19 DIAGNOSIS — I824Y9 Acute embolism and thrombosis of unspecified deep veins of unspecified proximal lower extremity: Secondary | ICD-10-CM

## 2013-08-19 DIAGNOSIS — R000 Tachycardia, unspecified: Secondary | ICD-10-CM

## 2013-08-19 DIAGNOSIS — M8448XA Pathological fracture, other site, initial encounter for fracture: Secondary | ICD-10-CM | POA: Insufficient documentation

## 2013-08-19 DIAGNOSIS — R209 Unspecified disturbances of skin sensation: Secondary | ICD-10-CM | POA: Insufficient documentation

## 2013-08-19 DIAGNOSIS — Z7901 Long term (current) use of anticoagulants: Secondary | ICD-10-CM

## 2013-08-19 LAB — COMPREHENSIVE METABOLIC PANEL (CC13)
ALK PHOS: 510 U/L — AB (ref 40–150)
ALT: 24 U/L (ref 0–55)
AST: 39 U/L — AB (ref 5–34)
Albumin: 2.1 g/dL — ABNORMAL LOW (ref 3.5–5.0)
Anion Gap: 9 mEq/L (ref 3–11)
BUN: 5 mg/dL — ABNORMAL LOW (ref 7.0–26.0)
CHLORIDE: 96 meq/L — AB (ref 98–109)
CO2: 23 mEq/L (ref 22–29)
Calcium: 8.8 mg/dL (ref 8.4–10.4)
Creatinine: 0.4 mg/dL — ABNORMAL LOW (ref 0.6–1.1)
Glucose: 96 mg/dl (ref 70–140)
POTASSIUM: 4.4 meq/L (ref 3.5–5.1)
Sodium: 128 mEq/L — ABNORMAL LOW (ref 136–145)
Total Bilirubin: 0.38 mg/dL (ref 0.20–1.20)
Total Protein: 6.7 g/dL (ref 6.4–8.3)

## 2013-08-19 LAB — CBC WITH DIFFERENTIAL/PLATELET
BASO%: 0.4 % (ref 0.0–2.0)
Basophils Absolute: 0.1 10*3/uL (ref 0.0–0.1)
EOS%: 2.3 % (ref 0.0–7.0)
Eosinophils Absolute: 0.3 10*3/uL (ref 0.0–0.5)
HCT: 30.7 % — ABNORMAL LOW (ref 34.8–46.6)
HGB: 10 g/dL — ABNORMAL LOW (ref 11.6–15.9)
LYMPH%: 8.6 % — ABNORMAL LOW (ref 14.0–49.7)
MCH: 26.7 pg (ref 25.1–34.0)
MCHC: 32.6 g/dL (ref 31.5–36.0)
MCV: 82.1 fL (ref 79.5–101.0)
MONO#: 2 10*3/uL — AB (ref 0.1–0.9)
MONO%: 15.2 % — ABNORMAL HIGH (ref 0.0–14.0)
NEUT#: 9.5 10*3/uL — ABNORMAL HIGH (ref 1.5–6.5)
NEUT%: 73.5 % (ref 38.4–76.8)
Platelets: 288 10*3/uL (ref 145–400)
RBC: 3.74 10*6/uL (ref 3.70–5.45)
RDW: 16 % — AB (ref 11.2–14.5)
WBC: 12.9 10*3/uL — AB (ref 3.9–10.3)
lymph#: 1.1 10*3/uL (ref 0.9–3.3)
nRBC: 0 % (ref 0–0)

## 2013-08-19 LAB — TECHNOLOGIST REVIEW

## 2013-08-19 MED ORDER — SODIUM CHLORIDE 0.9 % IJ SOLN
10.0000 mL | Freq: Once | INTRAMUSCULAR | Status: AC
  Administered 2013-08-19: 10 mL via INTRAVENOUS
  Filled 2013-08-19: qty 10

## 2013-08-19 MED ORDER — IOHEXOL 300 MG/ML  SOLN
80.0000 mL | Freq: Once | INTRAMUSCULAR | Status: AC | PRN
  Administered 2013-08-19: 80 mL via INTRAVENOUS

## 2013-08-19 MED ORDER — ONDANSETRON 8 MG/NS 50 ML IVPB
INTRAVENOUS | Status: AC
  Filled 2013-08-19: qty 8

## 2013-08-19 MED ORDER — DEXAMETHASONE SODIUM PHOSPHATE 10 MG/ML IJ SOLN
INTRAMUSCULAR | Status: AC
  Filled 2013-08-19: qty 1

## 2013-08-19 MED ORDER — HEPARIN SOD (PORK) LOCK FLUSH 100 UNIT/ML IV SOLN
500.0000 [IU] | Freq: Once | INTRAVENOUS | Status: AC
  Administered 2013-08-19: 500 [IU] via INTRAVENOUS
  Filled 2013-08-19: qty 5

## 2013-08-19 MED ORDER — DEXAMETHASONE 4 MG PO TABS: 4.0000 mg | ORAL_TABLET | Freq: Two times a day (BID) | ORAL | Status: DC

## 2013-08-19 MED ORDER — SODIUM CHLORIDE 0.9 % IV SOLN
INTRAVENOUS | Status: DC
  Administered 2013-08-19: 12:00:00 via INTRAVENOUS

## 2013-08-19 NOTE — Progress Notes (Signed)
Met with patient and husband to assess for needs.  She stated she has been "maintaining" at home, that having the hospital bed and chair lift has been a big help.  She stated that her nutrition is poor because she just does not have an appetite and it is hard to eat.  She is working with the dietician and will be seen today.  She denies any further assistance at this time.  Spent time giving emotional support.  She expressed appreciation for the visit.

## 2013-08-19 NOTE — Progress Notes (Signed)
Patient has had continued weight loss.  Weight documented as 111.3 pounds February 9 down from 115.6 pounds January 26.  Patient is having fluids today. Chemotherapy is on hold.  Noted stage II decubitus has almost healed.  Nutrition diagnosis: Food and Nutrition related knowledge deficit resolved.  Diagnosis of inadequate oral intake continues.  Intervention: Patient understands strategies for increasing oral intake however, she struggles with trying to take in adequate calories and protein.  Provided supportive listening.  Encouraged patient to work to increase her intake in small amounts.  Monitoring, evaluation, goals: Patient has been unable to increase calories and protein, and has had weight loss.  Next visit: Will continue to follow patient as needed.  Will await decision about further chemotherapy.

## 2013-08-19 NOTE — Progress Notes (Addendum)
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 She completed cycle 6 gemcitabine/oxaliplatin 08/05/2013. No significant nausea around the time of chemotherapy. Late last week she developed nausea lasting for 2 days. The nausea is better at present. She had loose stools for one day. No mouth sores. Cold sensitivity lasted for about 4 days. She continues to have pain at the right upper leg. She continues MS Contin 100 mg every 12 hours. She is taking MSIR about every 4 hours. She notes increased fatigue. She reports numbness at the right upper chest and neck for the past several days. She continues to have slight weakness on the right side. No shortness of breath. Appetite is poor. Her husband feels she is taking in adequate fluids.   Objective: Filed Vitals:   08/19/13 1029  BP: 122/76  Pulse: 130  Temp: 97.7 F (36.5 C)  Resp: 20   Pupils equal round and reactive to light. Extraocular movements intact. Oropharynx without thrush. Fullness at the right neck. Lungs clear. Heart is regular, tachycardic. Port-A-Cath site without erythema. Abdomen soft and nontender. No hepatomegaly. Biliary drain site is covered with a gauze dressing. No leg swelling. Calves nontender. Decreased sensation to light touch at the right upper chest and neck.   Lab Results: Lab Results  Component Value Date   WBC 12.9* 08/19/2013   HGB 10.0* 08/19/2013   HCT 30.7* 08/19/2013   MCV 82.1 08/19/2013   PLT 288 08/19/2013   NEUTROABS 9.5* 08/19/2013    Chemistry:    Chemistry      Component Value Date/Time   NA 128* 08/05/2013 1111   NA 133* 08/01/2013 1323   K 4.2 08/05/2013 1111   K 4.3 08/01/2013 1323   CL 96 08/01/2013 1323   CO2 23 08/05/2013 1111   CO2 25 08/01/2013 1323   BUN 5.1* 08/05/2013 1111   BUN 6 08/01/2013 1323   CREATININE 0.4* 08/05/2013 1111   CREATININE 0.36* 08/01/2013 1323      Component Value Date/Time   CALCIUM 8.1* 08/05/2013 1111   CALCIUM 8.5  08/01/2013 1323   ALKPHOS 530* 08/05/2013 1111   ALKPHOS 405* 08/01/2013 1323   AST 48* 08/05/2013 1111   AST 30 08/01/2013 1323   ALT 58* 08/05/2013 1111   ALT 22 08/01/2013 1323   BILITOT 0.54 08/05/2013 1111   BILITOT 0.4 08/01/2013 1323       Studies/Results:   Medications: I have reviewed the patient's current medications.  Assessment/Plan: 1. 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 Staging chest CT 05/15/2013 showed a 1.3 cm soft tissue mass within the medial aspect of the right breast; 1.9 cm left supraclavicular lymph node; multiple enlarged mediastinal lymph nodes; 3 mm left upper lobe pulmonary nodule. Repeat CT abdomen and pelvis 05/15/2013 showed interval increase in the size of previously visualized lesions throughout the liver and multiple additional new lesions; bulky retroperitoneal lymphadenopathy was stable; extensive porta hepatis lymphadenopathy was redemonstrated; 7 mm soft tissue nodule in the subcutaneous fat of the anterior abdominal wall; 2.5 cm calcified lesion within the left hemipelvis; interval development of a pathologic compression fracture of the L3 vertebral body with extension of soft tissue components into the canal which was narrowed approximately 50%; metastatic involvement of the T9 vertebral body with small amount of extension into the vertebral canal; small amount of abnormal enhancing soft tissue at T7 with mild narrowing  of the canal; destructive 2.6 cm lesion involving the lateral aspect of the left third rib. Palliative radiation to T9 and L3 on 05/21/2013  Cycle 1 gemcitabine/oxaliplatin on 05/22/2013  Cycle 2 gemcitabine/oxaliplatin on 06/05/2013  Cycle 3 gemcitabine/oxaliplatin on 06/19/2013  CT abdomen 06/28/2013 with improvement in the liver metastases and upper abdominal adenopathy, progression of  and L3 compression fracture and a new left lateral rib lesion  Cycle 4 gemcitabine/oxaliplatin 07/08/2013.  Cycle 5 gemcitabine/oxaliplatin 07/22/2013. Cycle 6 gemcitabine/oxaliplatin 08/05/2013. 2. Obstructive jaundice secondary to #1 status post placement of a percutaneous biliary drain on 05/02/2013. Cholangiogram 05/16/2013 showed the existing left internal/external biliary drain was well positioned. The biliary system was decompressed. The internal/external biliary drain was exchanged and a bile duct stent was placed on 07/15/2013. The jaundice has resolved. Removal of internal/external biliary drain 08/01/2013 with placement of an external biliary drain above the metallic stent. The tube was left capped. 3. Pain secondary to bone metastases involving the lumbar spine, retroperitoneal adenopathy. She continues to have pain at the right "hip ". The pain is likely related to the L3 compression fracture with nerve root compression She is taking MS Contin and MSIR.  Status post palliative radiation, one fraction, to the thoracic and lumbar spine metastases on 05/21/2013 4. Anorexia/weight loss secondary to #1 and pain 5. Subcutaneous lesion right scalp. Question lymph node. 6. Pain/tenderness at the left lower tibia. Lytic lesion confirmed on a plain x-ray 05/24/2013. Status post intramedullary nail on 05/28/2013, radiation on 06/12/2013. 7. Admission with sepsis syndrome 06/28/2013. Cultures negative. Likely cholangitis. Completed a course of Augmentin. 8. Left lower extremity edema. Doppler on 07/10/2013 confirmed a left posterior tibial and popliteal DVT. She is maintained on Lovenox. 9. Sacral and thoracic decubitus ulcers. She is now followed by a home skin care nurse. 10. Hypercalcemia of malignancy status post Zometa 06/28/2013. Improved. 11. Tachycardia. 12. Numbness at the right neck and chest.   Dispositon-today's chemotherapy is being placed on hold. She has developed numbness at the  right neck and chest. We are referring her for a stat CT scan of the neck.  She has resting tachycardia of unclear etiology. She will receive a liter of IV fluids in the office today with repeat vitals.  She will return for a followup visit on 08/21/2013 to review the CT result and decide upon continuation of the current chemotherapy regimen versus an alternative regimen versus a supportive care approach.   Patient seen with Dr. Truett Perna. 25 minutes were spent face-to-face at today's visit with the majority of that time involved in counseling/coordination of care.   Lonna Cobb ANP/GNP-BC    This was a shared visit with Lonna Cobb. Bianca Logan was interviewed and examined.  She has developed numbness over the right neck and arm with limited range of motion at the neck.   We referred her for a CT of the neck which confirmed a lytic lesions at C1, the dens, and multiple other vertebral bodies. A soft tissue mass with osseous destruction/fragmentation with a pathologic fracture is noted at C2. Involvement of the right pedicle and lamina of C2 was noted.  I contacted Bianca Logan with the CT report. She will be placed on Decadron and a radiation oncology referral was made. I reviewed the CT with a neurosurgeon. A support collar can be used for pain relief, but is not needed for stability.  Bianca Logan will return for an office visit on 08/21/2013. We will discontinue gemcitabine/oxaliplatin and consider second line chemotherapy versus hospice  care.   Julieanne Manson, M.D.

## 2013-08-19 NOTE — Patient Instructions (Signed)
Dehydration, Adult  Dehydration means your body does not have as much fluid as it needs. Your kidneys, brain, and heart will not work properly without the right amount of fluids and salt.   HOME CARE   Ask your doctor how to replace body fluid losses (rehydrate).   Drink enough fluids to keep your pee (urine) clear or pale yellow.   Drink small amounts of fluids often if you feel sick to your stomach (nauseous) or throw up (vomit).   Eat like you normally do.   Avoid:   Foods or drinks high in sugar.   Bubbly (carbonated) drinks.   Juice.   Very hot or cold fluids.   Drinks with caffeine.   Fatty, greasy foods.   Alcohol.   Tobacco.   Eating too much.   Gelatin desserts.   Wash your hands to avoid spreading germs (bacteria, viruses).   Only take medicine as told by your doctor.   Keep all doctor visits as told.  GET HELP RIGHT AWAY IF:    You cannot drink something without throwing up.   You get worse even with treatment.   Your vomit has blood in it or looks greenish.   Your poop (stool) has blood in it or looks black and tarry.   You have not peed in 6 to 8 hours.   You pee a small amount of very dark pee.   You have a fever.   You pass out (faint).   You have belly (abdominal) pain that gets worse or stays in one spot (localizes).   You have a rash, stiff neck, or bad headache.   You get easily annoyed, sleepy, or are hard to wake up.   You feel weak, dizzy, or very thirsty.  MAKE SURE YOU:    Understand these instructions.   Will watch your condition.   Will get help right away if you are not doing well or get worse.  Document Released: 04/23/2009 Document Revised: 09/19/2011 Document Reviewed: 02/14/2011  [employer] Patient Information 2014 [employer]

## 2013-08-19 NOTE — Progress Notes (Signed)
[facility] [phone] with spouse in no distress.

## 2013-08-19 NOTE — Progress Notes (Signed)
Vitals WNL 1 hour after arrival to infusion and 0.9% normal saline at 500 ml infusion started.

## 2013-08-19 NOTE — Telephone Encounter (Signed)
Gave pt appt for MD appt called linda for precert for CT today

## 2013-08-20 ENCOUNTER — Ambulatory Visit
Admission: RE | Admit: 2013-08-20 | Discharge: 2013-08-20 | Disposition: A | Discharge status: Home / Self Care | Payer: BC Managed Care – PPO | Source: Ambulatory Visit | Attending: Radiation Oncology | Admitting: Radiation Oncology

## 2013-08-20 ENCOUNTER — Encounter: Payer: Self-pay | Admitting: Radiation Oncology

## 2013-08-20 VITALS — BP 119/73 | HR 117 | Temp 98.5°F | Resp 16 | Ht 63.0 in | Wt 111.0 lb

## 2013-08-20 DIAGNOSIS — M542 Cervicalgia: Secondary | ICD-10-CM | POA: Insufficient documentation

## 2013-08-20 DIAGNOSIS — C7952 Secondary malignant neoplasm of bone marrow: Secondary | ICD-10-CM

## 2013-08-20 DIAGNOSIS — K8309 Other cholangitis: Secondary | ICD-10-CM

## 2013-08-20 DIAGNOSIS — C221 Intrahepatic bile duct carcinoma: Secondary | ICD-10-CM | POA: Insufficient documentation

## 2013-08-20 DIAGNOSIS — C7951 Secondary malignant neoplasm of bone: Secondary | ICD-10-CM | POA: Insufficient documentation

## 2013-08-20 DIAGNOSIS — M8448XA Pathological fracture, other site, initial encounter for fracture: Secondary | ICD-10-CM | POA: Insufficient documentation

## 2013-08-20 DIAGNOSIS — Z510 Encounter for antineoplastic radiation therapy: Secondary | ICD-10-CM | POA: Insufficient documentation

## 2013-08-20 DIAGNOSIS — Z79899 Other long term (current) drug therapy: Secondary | ICD-10-CM | POA: Insufficient documentation

## 2013-08-20 NOTE — Progress Notes (Addendum)
Department of Radiation Oncology  Phone:  [phone] Fax:        [phone]   Name: Bianca Logan MRN: [id number]  DOB: Nov 10, 1950  Date: 08/20/2013  Follow Up Visit Note  Diagnosis: Metastatic cholangiocarcinoma  Summary and Interval since last radiation: 8 Gy in 1 fraction to the left tibia and 3 months from 8 Gy in a single fraction to T6-T10 and L2-L4.  Interval History: Bianca Logan presents today for routine followup.  Her pain is essentially been stable throughout the course of her treatments. She is still on narcotics. Her bowels are moving normally. She is excited that she was able have a stent placed and still has an external draining tube. She presented to medical oncology on Monday with complaints of right neck and upper chest numbness. This has been going on since about Saturday. She has not had any weakness. She did not been having any difficulties with grasp of her right hand. A CT of the neck was performed on 08/19/2013 which showed a lytic lesion in the right aspect of C1 with cortical destruction rate to medially. Soft tissue mass in the osseous structures are pregnant patient with associated collapse and pathologic fracture of C2 were also noted and involvement of the right pedicle and lamina of C2. Invasion of the C2-3 neural foramen was also noted. Involvement in the C3 vertebral body was noted as well. Additional metastases were noted involving C7 T2-T3 and C4-5. She was placed on Decadron. She is on 4 mg twice a day. She's taken 2 doses and hasn't really noticed a change in her symptoms. Her ambulation she states is actually beginning much better and she's been able to ambulate more with a walker. She was referred back to me for consideration of palliative radiation to the area in her cervical spine. Per the medical oncology note, neurosurgery has been contacted and recommended a soft collar.  She is not wearing a soft collar.  Allergies: No Known  Allergies  Medications:  Current Outpatient Prescriptions  Medication Sig Dispense Refill  . dexamethasone (DECADRON) 4 MG tablet Take 1 tablet (4 mg total) by mouth 2 (two) times daily.  40 tablet  0  . enoxaparin (LOVENOX) 100 MG/ML injection Inject 0.9 mLs (90 mg total) into the skin daily. Inject 90 mg to abdomen daily  30 Syringe  3  . lidocaine-prilocaine (EMLA) cream Apply 1 application topically daily as needed (for pain on port).      . morphine (MS CONTIN) 100 MG 12 hr tablet Take 1 tablet (100 mg total) by mouth every 12 (twelve) hours.  60 tablet  0  . morphine (MSIR) 30 MG tablet Take 1 tablet (30 mg total) by mouth every 4 (four) hours as needed for moderate pain or severe pain.  60 tablet  0  . omeprazole (PRILOSEC) 20 MG capsule Take 1 capsule (20 mg total) by mouth 2 (two) times daily before a meal.  180 capsule  1  . sennosides-docusate sodium (SENOKOT-S) 8.6-50 MG tablet Take 1 tablet by mouth 2 (two) times daily.      . ondansetron (ZOFRAN) 4 MG tablet Take 4 mg by mouth every 6 (six) hours as needed for nausea or vomiting.      . polyethylene glycol (MIRALAX / GLYCOLAX) packet Take 17 g by mouth daily as needed for mild constipation or moderate constipation.       . potassium chloride SA (K-DUR,KLOR-CON) 20 MEQ tablet Take 1 tablet (20 mEq total) by mouth  daily.  90 tablet  1  . prochlorperazine (COMPAZINE) 10 MG tablet Take 10 mg by mouth every 6 (six) hours as needed for nausea or vomiting.      . Sodium Chloride Flush (NORMAL SALINE FLUSH) 0.9 % SOLN 5 mLs daily.        No current facility-administered medications for this encounter.    Physical Exam:  Filed Vitals:   08/20/13 1404  BP: 119/73  Pulse: 117  Temp: 98.5 F (36.9 C)  TempSrc: Oral  Resp: 16  Height: 5\' 3"  (1.6 m)  Weight: 111 lb (50.349 kg)  SpO2: 100%   she is a pleasant female in no distress sitting comfortably in a wheelchair. She has 5 out of 5 grip and biceps strength in her bilateral upper  extremities. She has 5 out of 5 interosseous strength. She has a sensory level in the right upper chest at the area of her Port-A-Cath up to her right mandible.  IMPRESSION: Bianca Logan is a 63 y.o. female status post palliative radiation with new cervical lesions and right-sided sensory level  PLAN:  I discussed with [name] and her husband the indications for palliative radiation to this area. We discussed the goal of treatment was to deep decrease or minimize the tumor close to the spinal cord. We discussed the process of simulation the making a mask. We discussed 12 treatments as an outpatient. We discussed I do not feel the safe to give her a single fraction treatment as we have done in the past given the proximity of this area to her posterior pharynx. I feel the pharyngitis would be significant if we tried to do this in a single fraction. For that reason I recommended a more prolonged course. This may necessitate putting off her chemotherapy. She has signed informed consent and agree to proceed forward after discussion of the risks and benefits of treatment.    Thea Silversmith, MD

## 2013-08-20 NOTE — Progress Notes (Signed)
See progress note under physician encounter. 

## 2013-08-20 NOTE — Progress Notes (Signed)
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  Identity was confirmed.  All relevant records and images related to the planned course of therapy were reviewed.  Then, the patient was positioned in a stable reproducible clinical set-up for radiation therapy and the mask was made.  CT images were obtained.  An isocenter was placed. Skin markings were placed.  The CT images were loaded into the planning software where the target and avoidance structures were contoured.  The radiation prescription was entered and confirmed. The patient was discharged in stable condition and tolerated simulation well.    TREATMENT PLANNING NOTE:  Treatment planning then occurred. I have requested : MLC's, isodose plan, basic dose calculation  I have requested 3 dimensional simulation with DVH of cord, brain and gtv.    I personally oversaw the creation of 2 complex treatment devices in the form of unique MLCs which will be use dto shield critical normal structures.

## 2013-08-20 NOTE — Addendum Note (Signed)
Encounter addended by: Thea Silversmith, MD on: 08/20/2013  5:05 PM<BR>     Documentation filed: Notes Section

## 2013-08-20 NOTE — Progress Notes (Signed)
Histology and Location of Primary Cancer:Metastatic cholangiocarcinoma confirmed on 04/26/2013 via [facility] Endoscopic ultrasound performed on 05/02/13.  Sites of Visceral and Bony Metastatic Disease:Destructive lytic lesions of right C2 vertebral body with extension into the right posterior elements and probable invasion of right C2-3 neural foramen revealed on ct of neck 08/19/2013.  Location(s) of Symptomatic Metastases:C-spine  Past/Anticipated chemotherapy by medical oncology, if KNL:ZJQBHALPFXT/KWIOXBDZHGD x 6 cycles completed.Plan  To discontinue gemcitabine/oxaliplatin and consider second line chemotherapy.  Pain on a scale of 0-10 JM:EQASTMH pain medication regime ms contin 100 mg q12h and msir 30 mg every four hours otc.   If Spine Met(s), symptoms, if any, include:  Bowel/Bladder retention or incontinence (please describe): Denies  Numbness or weakness in extremities (please describe): reports numbness of the right hand and right leg from the hip to the knee (pt denies this pain is worse since second round of radiation)  Current Decadron regimen, if applicable: 31m twice daily; explains they began taking this last night around 9 pm  Ambulatory status?wheelchair but, ambulates at home with the assistance of a walker  SAFETY ISSUES:  Prior radiation? Spine T9 and L3 on 05/21/2013 and left femur on 06/12/2013.  Pacemaker/ICD? No  Possible current pregnancy?No  Is the patient on methotrexate? No  Current Complaints / other details:  Patient scheduled to follow up wih Dr. SBenay Spiceon 08/21/2013. Reports last chemotherapy was two weeks ago Monday but, is on hold now because "it isn't working." Reports occasional episodes of right anterior neck numbness that last 2-3 minutes at a time. Reports nausea last week but, denies emesis. Reports having daily formed bowel movements. Reports she has lost 20 lb since October 2014 but, is working closely with [facility] [place] Reports night sweats continue.  Expresses, "despite the ct results I want i known that I still plan to aggressively fight."

## 2013-08-21 ENCOUNTER — Ambulatory Visit (HOSPITAL_BASED_OUTPATIENT_CLINIC_OR_DEPARTMENT_OTHER): Payer: BC Managed Care – PPO | Admitting: Oncology

## 2013-08-21 ENCOUNTER — Other Ambulatory Visit (HOSPITAL_COMMUNITY): Payer: BC Managed Care – PPO

## 2013-08-21 ENCOUNTER — Telehealth: Payer: Self-pay | Admitting: Oncology

## 2013-08-21 VITALS — BP 122/69 | HR 136 | Temp 97.5°F | Resp 19 | Ht 63.0 in | Wt 110.0 lb

## 2013-08-21 DIAGNOSIS — G893 Neoplasm related pain (acute) (chronic): Secondary | ICD-10-CM

## 2013-08-21 DIAGNOSIS — R634 Abnormal weight loss: Secondary | ICD-10-CM

## 2013-08-21 DIAGNOSIS — C786 Secondary malignant neoplasm of retroperitoneum and peritoneum: Secondary | ICD-10-CM

## 2013-08-21 DIAGNOSIS — R17 Unspecified jaundice: Secondary | ICD-10-CM

## 2013-08-21 DIAGNOSIS — C7951 Secondary malignant neoplasm of bone: Secondary | ICD-10-CM

## 2013-08-21 DIAGNOSIS — C221 Intrahepatic bile duct carcinoma: Secondary | ICD-10-CM

## 2013-08-21 DIAGNOSIS — C7952 Secondary malignant neoplasm of bone marrow: Principal | ICD-10-CM

## 2013-08-21 DIAGNOSIS — C787 Secondary malignant neoplasm of liver and intrahepatic bile duct: Secondary | ICD-10-CM

## 2013-08-21 DIAGNOSIS — R6889 Other general symptoms and signs: Secondary | ICD-10-CM

## 2013-08-21 DIAGNOSIS — R630 Anorexia: Secondary | ICD-10-CM

## 2013-08-21 MED ORDER — MORPHINE SULFATE 30 MG PO TABS
30.0000 mg | ORAL_TABLET | ORAL | Status: AC | PRN
Start: 2013-08-21 — End: ?

## 2013-08-21 NOTE — Progress Notes (Signed)
Bianca Logan    OFFICE PROGRESS NOTE   INTERVAL HISTORY:   Bianca Logan started Decadron 08/19/2013 after she was found to have a pathologic compression fracture at C2. She has not noted any numbness in the intermittent "numbness "in the right neck and upper chest. She denies neck pain. She is scheduled to begin palliative radiation to the neck on 08/22/2013.no dyspnea.  She reports feeling relatively well. She continues MS Contin and MSIR for pain. Her bowels are moving daily.  Objective:  Vital signs in last 24 hours:  Blood pressure 122/69, pulse 136, temperature 97.5 F (36.4 C), temperature source Oral, resp. rate 19, height 5\' 3"  (1.6 m), weight 110 lb (49.896 kg).   Resp: lungs clear bilaterally Cardio: regular rate and rhythm, tachycardia GI: nontender, upper abdomen biliary drain with a dressing in place Vascular: no leg edema  Portacath/PICC-without erythema  Lab Results:  Lab Results  Component Value Date   WBC 12.9* 08/19/2013   HGB 10.0* 08/19/2013   HCT 30.7* 08/19/2013   MCV 82.1 08/19/2013   PLT 288 08/19/2013   NEUTROABS 9.5* 08/19/2013      Medications: I have reviewed the patient's current medications.  Assessment/Plan: 1. 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 Staging chest CT 05/15/2013 showed a 1.3 cm soft tissue mass within the medial aspect of the right breast; 1.9 cm left supraclavicular lymph node; multiple enlarged mediastinal lymph nodes; 3 mm left upper lobe pulmonary nodule. Repeat CT abdomen and pelvis 05/15/2013 showed interval increase in the size of previously visualized lesions throughout the liver and multiple additional new lesions; bulky retroperitoneal lymphadenopathy was stable; extensive porta hepatis lymphadenopathy was redemonstrated; 7 mm soft tissue nodule in the  subcutaneous fat of the anterior abdominal wall; 2.5 cm calcified lesion within the left hemipelvis; interval development of a pathologic compression fracture of the L3 vertebral body with extension of soft tissue components into the canal which was narrowed approximately 50%; metastatic involvement of the T9 vertebral body with small amount of extension into the vertebral canal; small amount of abnormal enhancing soft tissue at T7 with mild narrowing of the canal; destructive 2.6 cm lesion involving the lateral aspect of the left third rib. Palliative radiation to T9 and L3 on 05/21/2013  Cycle 1 gemcitabine/oxaliplatin on 05/22/2013  Cycle 2 gemcitabine/oxaliplatin on 06/05/2013  Cycle 3 gemcitabine/oxaliplatin on 06/19/2013  CT abdomen 06/28/2013 with improvement in the liver metastases and upper abdominal adenopathy, progression of and L3 compression fracture and a new left lateral rib lesion  Cycle 4 gemcitabine/oxaliplatin 07/08/2013.  Cycle 5 gemcitabine/oxaliplatin 07/22/2013.  Cycle 6 gemcitabine/oxaliplatin 08/05/2013.  next CT 08/19/2013 with a destructive lytic metastasis at the right side of C2 with a pathologic fracture, multiple additional bone metastases noted 2. Obstructive jaundice secondary to #1 status post placement of a percutaneous biliary drain on 05/02/2013. Cholangiogram 05/16/2013 showed the existing left internal/external biliary drain was well positioned. The biliary system was decompressed. The internal/external biliary drain was exchanged and a bile duct stent was placed on 07/15/2013. The jaundice has resolved. Removal of internal/external biliary drain 08/01/2013 with placement of an external biliary drain above the metallic stent. The tube was left capped. 3. Pain secondary to bone metastases involving the lumbar spine, retroperitoneal adenopathy. She continues to have pain at the right "hip ". The pain is likely related to the L3 compression fracture with nerve root  compression She  is taking MS Contin and MSIR.  Status post palliative radiation, one fraction, to the thoracic and lumbar spine metastases on 05/21/2013 4. Anorexia/weight loss secondary to #1 and pain 5. Subcutaneous lesion right scalp. Question lymph node. 6. Pain/tenderness at the left lower tibia. Lytic lesion confirmed on a plain x-ray 05/24/2013. Status post intramedullary nail on 05/28/2013, radiation on 06/12/2013. 7. Admission with sepsis syndrome 06/28/2013. Cultures negative. Likely cholangitis. Completed a course of Augmentin. 8. Left lower extremity edema. Doppler on 07/10/2013 confirmed a left posterior tibial and popliteal DVT. She is maintained on Lovenox. 9. Sacral and thoracic decubitus ulcers. She is now followed by a home skin care nurse. 10. Hypercalcemia of malignancy status post Zometa 06/28/2013. Improved. 11. Tachycardia.       12. Numbness at the right neck and chest, noted to 90 pounds 15, likely secondary to the C2 fracture with neural compression  Disposition:  She appears unchanged today. The neck/chest numbness is likely related to right foramen encroachment at C2 with the pathologic fracture. She is scheduled to begin a course of palliative radiation on 08/22/2013.  The destructive lesion at C2 is likely indicative of disease progression despite gemcitabine/oxaliplatin. However, her overall performance status remains improved and the hypercalcemia has not returned. She will complete the scheduled radiation and we will refer her for a restaging CT evaluation.she will return for an office visit and further discussion after the CTs. The plan is to proceed with second line chemotherapy if disease regression is confirmed. She understands there is no proven survival benefit with second line chemotherapy in this setting.there is a small chance of obtaining a clinical response. However ,she was in excellent health prior to being diagnosed with cholangiocarcinoma and would like  to proceed with second line chemotherapy.I recommend docetaxel given on a weekly schedule. We reviewed the potential toxicities associated with docetaxel including the chance for neuropathy, mucositis, diarrhea, allergic reaction, fluid retention, hematologic toxicity, and alopecia.  The plan is to begin docetaxel chemotherapy during the week of 10/08/2013 if disease progression is confirmed on the restaging CT scans.  We discussed the indication for a cervical collar. She does not have neck Pain at present and we decided against a collar for now.   Betsy Coder, MD  08/21/2013  4:32 PM

## 2013-08-21 NOTE — Telephone Encounter (Signed)
gv adn printed appt scehd and avs for pt for Feb...gv pt barium

## 2013-08-22 ENCOUNTER — Ambulatory Visit
Admission: RE | Admit: 2013-08-22 | Discharge: 2013-08-22 | Disposition: A | Discharge status: Home / Self Care | Payer: BC Managed Care – PPO | Source: Ambulatory Visit | Attending: Radiation Oncology | Admitting: Radiation Oncology

## 2013-08-22 ENCOUNTER — Telehealth: Payer: Self-pay | Admitting: *Deleted

## 2013-08-22 DIAGNOSIS — C419 Malignant neoplasm of bone and articular cartilage, unspecified: Secondary | ICD-10-CM

## 2013-08-22 NOTE — Telephone Encounter (Signed)
VM reporting need to renew orders for weekly wound assessment per home health if MD wishes to continue. Asking if [facility] had wound/ostomy nurse to follow wound? Called back and left VM that we can assess wound when she is seeing MD on 2/25 and we'll be seeing her every 2 weeks. She can bring her dressing supplies to office with her and [facility] can see her on an every other week schedule. Suggested she follow up with patient to see what she prefers/needs.

## 2013-08-23 ENCOUNTER — Ambulatory Visit
Admission: RE | Admit: 2013-08-23 | Discharge: 2013-08-23 | Disposition: A | Discharge status: Home / Self Care | Payer: BC Managed Care – PPO | Source: Ambulatory Visit | Attending: Radiation Oncology | Admitting: Radiation Oncology

## 2013-08-23 ENCOUNTER — Telehealth: Payer: Self-pay | Admitting: *Deleted

## 2013-08-23 NOTE — Progress Notes (Signed)
  Radiation Oncology         [phone]) [phone] ________________________________  Name: Bianca Logan MRN: [id number]  Date: 08/22/2013  DOB: [birth year]  Simulation Verification Note  Status: outpatient  NARRATIVE: The patient was brought to the treatment unit and placed in the planned treatment position. The clinical setup was verified. Then port films were obtained and uploaded to the radiation oncology medical record software.  The treatment beams were carefully compared against the planned radiation fields. The position location and shape of the radiation fields was reviewed. The targeted volume of tissue appears appropriately covered by the radiation beams. Organs at risk appear to be excluded as planned.  Based on my personal review, I approved the simulation verification. The patient's treatment will proceed as planned.  ------------------------------------------------  Thea Silversmith, MD

## 2013-08-23 NOTE — Telephone Encounter (Signed)
Message from [place], [place] with [facility] reporting wound RN has seen pt. Recommends gel overlay for wheelchair and bed. Also recommends Medi-Honey paste for wound on back that has been slow to heal. Asking if Dr. Madaline Brilliant to proceed with these interventions and Benay Spice would like them to draw prealbumin level? No need to draw pre albumin, per Dr. Benay Spice. OK for overlays and wound care. Left message for [facility] with this info.

## 2013-08-26 ENCOUNTER — Ambulatory Visit
Admission: RE | Admit: 2013-08-26 | Discharge: 2013-08-26 | Disposition: A | Discharge status: Home / Self Care | Payer: BC Managed Care – PPO | Source: Ambulatory Visit | Attending: Radiation Oncology | Admitting: Radiation Oncology

## 2013-08-27 ENCOUNTER — Ambulatory Visit
Admission: RE | Admit: 2013-08-27 | Discharge: 2013-08-27 | Disposition: A | Discharge status: Home / Self Care | Payer: BC Managed Care – PPO | Source: Ambulatory Visit | Attending: Radiation Oncology | Admitting: Radiation Oncology

## 2013-08-27 ENCOUNTER — Encounter: Payer: Self-pay | Admitting: Radiation Oncology

## 2013-08-27 VITALS — BP 126/69 | HR 120 | Resp 16 | Wt 103.0 lb

## 2013-08-27 DIAGNOSIS — C7951 Secondary malignant neoplasm of bone: Secondary | ICD-10-CM

## 2013-08-27 DIAGNOSIS — C7952 Secondary malignant neoplasm of bone marrow: Principal | ICD-10-CM

## 2013-08-27 NOTE — Progress Notes (Signed)
Weekly Management Note Current Dose: 10  Gy  Projected Dose: 30 Gy   Narrative:  The patient presents for routine under treatment assessment.  CBCT/MVCT images/Port film x-rays were reviewed.  The chart was checked. Decreased appetite. More pain because she slept through the night and did not take her pain medications. Feels sleepy. Chemo delayed. CT scan next week. No sore throat.   Physical Findings: Weight: 103 lb (46.72 kg). Unchanged  Impression:  The patient is tolerating radiation.  Plan:  Continue treatment as planned.

## 2013-08-27 NOTE — Progress Notes (Signed)
Reports that she continue to take decadron 4 mg bid. Reports she slept great last night but, missed her 0400-0700 dose of pain medication therefore, she has "felt off all day." Reports numbness in cervical spine which has slightly improved. Reports right hip pain 5 on a scale of 0-10 but, reports "its time for more medication." Reports nausea this morning that Zofran resolved. Denies difficulty swallowing.

## 2013-08-28 ENCOUNTER — Ambulatory Visit
Admission: RE | Admit: 2013-08-28 | Discharge: 2013-08-28 | Disposition: A | Discharge status: Home / Self Care | Payer: BC Managed Care – PPO | Source: Ambulatory Visit | Attending: Radiation Oncology | Admitting: Radiation Oncology

## 2013-08-28 ENCOUNTER — Other Ambulatory Visit: Payer: Self-pay | Admitting: *Deleted

## 2013-08-29 ENCOUNTER — Ambulatory Visit
Admission: RE | Admit: 2013-08-29 | Discharge: 2013-08-29 | Disposition: A | Discharge status: Home / Self Care | Payer: BC Managed Care – PPO | Source: Ambulatory Visit | Attending: Radiation Oncology | Admitting: Radiation Oncology

## 2013-08-30 ENCOUNTER — Ambulatory Visit
Admission: RE | Admit: 2013-08-30 | Discharge: 2013-08-30 | Disposition: A | Discharge status: Home / Self Care | Payer: BC Managed Care – PPO | Source: Ambulatory Visit | Attending: Radiation Oncology | Admitting: Radiation Oncology

## 2013-09-01 ENCOUNTER — Other Ambulatory Visit: Payer: Self-pay | Admitting: Oncology

## 2013-09-02 ENCOUNTER — Ambulatory Visit
Admission: RE | Admit: 2013-09-02 | Discharge: 2013-09-02 | Disposition: A | Discharge status: Home / Self Care | Payer: BC Managed Care – PPO | Source: Ambulatory Visit | Attending: Radiation Oncology | Admitting: Radiation Oncology

## 2013-09-02 ENCOUNTER — Encounter: Payer: BC Managed Care – PPO | Admitting: Nutrition

## 2013-09-02 ENCOUNTER — Other Ambulatory Visit: Payer: BC Managed Care – PPO

## 2013-09-02 ENCOUNTER — Ambulatory Visit: Payer: BC Managed Care – PPO

## 2013-09-02 ENCOUNTER — Ambulatory Visit: Payer: BC Managed Care – PPO | Admitting: Oncology

## 2013-09-03 ENCOUNTER — Encounter (HOSPITAL_COMMUNITY): Payer: Self-pay

## 2013-09-03 ENCOUNTER — Other Ambulatory Visit (HOSPITAL_BASED_OUTPATIENT_CLINIC_OR_DEPARTMENT_OTHER): Payer: BC Managed Care – PPO

## 2013-09-03 ENCOUNTER — Ambulatory Visit
Admission: RE | Admit: 2013-09-03 | Discharge: 2013-09-03 | Disposition: A | Discharge status: Home / Self Care | Payer: BC Managed Care – PPO | Source: Ambulatory Visit | Attending: Radiation Oncology | Admitting: Radiation Oncology

## 2013-09-03 ENCOUNTER — Ambulatory Visit (HOSPITAL_COMMUNITY)
Admission: RE | Admit: 2013-09-03 | Discharge: 2013-09-03 | Disposition: A | Discharge status: Home / Self Care | Payer: BC Managed Care – PPO | Source: Ambulatory Visit | Attending: Oncology | Admitting: Oncology

## 2013-09-03 VITALS — BP 104/68 | HR 117 | Temp 98.1°F

## 2013-09-03 DIAGNOSIS — C7952 Secondary malignant neoplasm of bone marrow: Principal | ICD-10-CM

## 2013-09-03 DIAGNOSIS — C7951 Secondary malignant neoplasm of bone: Secondary | ICD-10-CM

## 2013-09-03 DIAGNOSIS — C787 Secondary malignant neoplasm of liver and intrahepatic bile duct: Secondary | ICD-10-CM | POA: Insufficient documentation

## 2013-09-03 DIAGNOSIS — K8020 Calculus of gallbladder without cholecystitis without obstruction: Secondary | ICD-10-CM | POA: Insufficient documentation

## 2013-09-03 DIAGNOSIS — C221 Intrahepatic bile duct carcinoma: Secondary | ICD-10-CM

## 2013-09-03 DIAGNOSIS — C768 Malignant neoplasm of other specified ill-defined sites: Secondary | ICD-10-CM | POA: Insufficient documentation

## 2013-09-03 LAB — CBC WITH DIFFERENTIAL/PLATELET
BASO%: 0.3 % (ref 0.0–2.0)
Basophils Absolute: 0.1 10*3/uL (ref 0.0–0.1)
EOS%: 0.2 % (ref 0.0–7.0)
Eosinophils Absolute: 0 10*3/uL (ref 0.0–0.5)
HEMATOCRIT: 37.5 % (ref 34.8–46.6)
HGB: 12.3 g/dL (ref 11.6–15.9)
LYMPH%: 2.4 % — AB (ref 14.0–49.7)
MCH: 26.8 pg (ref 25.1–34.0)
MCHC: 32.7 g/dL (ref 31.5–36.0)
MCV: 82 fL (ref 79.5–101.0)
MONO#: 1.3 10*3/uL — ABNORMAL HIGH (ref 0.1–0.9)
MONO%: 5.7 % (ref 0.0–14.0)
NEUT#: 20.7 10*3/uL — ABNORMAL HIGH (ref 1.5–6.5)
NEUT%: 91.4 % — ABNORMAL HIGH (ref 38.4–76.8)
PLATELETS: 192 10*3/uL (ref 145–400)
RBC: 4.58 10*6/uL (ref 3.70–5.45)
RDW: 16.6 % — ABNORMAL HIGH (ref 11.2–14.5)
WBC: 22.7 10*3/uL — ABNORMAL HIGH (ref 3.9–10.3)
lymph#: 0.5 10*3/uL — ABNORMAL LOW (ref 0.9–3.3)

## 2013-09-03 LAB — COMPREHENSIVE METABOLIC PANEL (CC13)
ALT: 39 U/L (ref 0–55)
AST: 32 U/L (ref 5–34)
Albumin: 2.3 g/dL — ABNORMAL LOW (ref 3.5–5.0)
Alkaline Phosphatase: 607 U/L — ABNORMAL HIGH (ref 40–150)
Anion Gap: 12 mEq/L — ABNORMAL HIGH (ref 3–11)
BILIRUBIN TOTAL: 0.48 mg/dL (ref 0.20–1.20)
BUN: 10.5 mg/dL (ref 7.0–26.0)
CO2: 21 mEq/L — ABNORMAL LOW (ref 22–29)
CREATININE: 0.5 mg/dL — AB (ref 0.6–1.1)
Calcium: 8.8 mg/dL (ref 8.4–10.4)
Chloride: 92 mEq/L — ABNORMAL LOW (ref 98–109)
Glucose: 108 mg/dl (ref 70–140)
Potassium: 4.5 mEq/L (ref 3.5–5.1)
Sodium: 124 mEq/L — ABNORMAL LOW (ref 136–145)
Total Protein: 6.9 g/dL (ref 6.4–8.3)

## 2013-09-03 LAB — TECHNOLOGIST REVIEW

## 2013-09-03 MED ORDER — IOHEXOL 300 MG/ML  SOLN
100.0000 mL | Freq: Once | INTRAMUSCULAR | Status: AC | PRN
Start: 2013-09-03 — End: 2013-09-03
  Administered 2013-09-03: 100 mL via INTRAVENOUS

## 2013-09-03 MED ORDER — MAGIC MOUTHWASH W/LIDOCAINE: 5.0000 mL | Freq: Three times a day (TID) | ORAL | Status: AC | PRN

## 2013-09-03 NOTE — Progress Notes (Signed)
Weekly Management Note Current Dose: 22.5  Gy  Projected Dose: 30 Gy   Narrative:  The patient presents for routine under treatment assessment.  CBCT/MVCT images/Port film x-rays were reviewed.  The chart was checked. Found some supplements she likes. More neck pain. No odynophagia. 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  Physical Findings: Weight:  . Unchanged. In a wheelchair.  Impression:  The patient is tolerating radiation.  Plan:  Continue treatment as planned. Discussed possible course of odynophagia. Gave script for MMW with lidocaine and isntructionson its use. Discussed use of pain medications if needed.

## 2013-09-03 NOTE — Progress Notes (Signed)
Patient here for weekly assessment of C1-C4 spine.Took pain medication prior to treatment.Has some Right leg pain.Scheduled for ct of chest today per Dr.Sherrill to review effectiveness of treatment thus far.No other concerns voiced today.

## 2013-09-04 ENCOUNTER — Telehealth: Payer: Self-pay | Admitting: Oncology

## 2013-09-04 ENCOUNTER — Ambulatory Visit: Payer: BC Managed Care – PPO | Admitting: Oncology

## 2013-09-04 ENCOUNTER — Ambulatory Visit
Admission: RE | Admit: 2013-09-04 | Discharge: 2013-09-04 | Disposition: A | Discharge status: Home / Self Care | Payer: BC Managed Care – PPO | Source: Ambulatory Visit | Attending: Radiation Oncology | Admitting: Radiation Oncology

## 2013-09-04 ENCOUNTER — Ambulatory Visit: Payer: BC Managed Care – PPO | Admitting: Nutrition

## 2013-09-04 ENCOUNTER — Ambulatory Visit (HOSPITAL_BASED_OUTPATIENT_CLINIC_OR_DEPARTMENT_OTHER): Payer: BC Managed Care – PPO | Admitting: Nurse Practitioner

## 2013-09-04 VITALS — BP 128/76 | HR 131 | Temp 97.6°F | Resp 18 | Ht 63.0 in

## 2013-09-04 DIAGNOSIS — C786 Secondary malignant neoplasm of retroperitoneum and peritoneum: Secondary | ICD-10-CM

## 2013-09-04 DIAGNOSIS — C7952 Secondary malignant neoplasm of bone marrow: Principal | ICD-10-CM

## 2013-09-04 DIAGNOSIS — C7951 Secondary malignant neoplasm of bone: Secondary | ICD-10-CM

## 2013-09-04 DIAGNOSIS — C221 Intrahepatic bile duct carcinoma: Secondary | ICD-10-CM

## 2013-09-04 DIAGNOSIS — I82409 Acute embolism and thrombosis of unspecified deep veins of unspecified lower extremity: Secondary | ICD-10-CM

## 2013-09-04 LAB — CEA: CEA: 42.3 ng/mL — ABNORMAL HIGH (ref 0.0–5.0)

## 2013-09-04 MED ORDER — MORPHINE SULFATE ER 100 MG PO TBCR: 100.0000 mg | EXTENDED_RELEASE_TABLET | Freq: Two times a day (BID) | ORAL | Status: AC

## 2013-09-04 NOTE — Telephone Encounter (Signed)
gv adn printed appt sched and avs for pt for March.... °

## 2013-09-04 NOTE — Progress Notes (Addendum)
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 She is currently completing a course of palliative radiation to the neck. She denies dysphagia. Appetite continues to be poor. Back pain is controlled with MS Contin and MSIR. She notes a "twinge" of neck pain with turning. She had some loose stools yesterday. She attributes this to drinking CT contrast.   Objective: Filed Vitals:   09/04/13 1148  BP: 128/76  Pulse: 131  Temp: 97.6 F (36.4 C)  Resp: 18   No thrush. Lungs clear. Regular cardiac rhythm. Tachycardic. Port-A-Cath site without erythema. Abdomen soft and nontender. Biliary drain site covered with a dressing. No leg edema.   Lab Results: Lab Results  Component Value Date   WBC 22.7* 09/03/2013   HGB 12.3 09/03/2013   HCT 37.5 09/03/2013   MCV 82.0 09/03/2013   PLT 192 09/03/2013   NEUTROABS 20.7* 09/03/2013    Chemistry:    Chemistry      Component Value Date/Time   NA 124* 09/03/2013 1208   NA 133* 08/01/2013 1323   K 4.5 09/03/2013 1208   K 4.3 08/01/2013 1323   CL 96 08/01/2013 1323   CO2 21* 09/03/2013 1208   CO2 25 08/01/2013 1323   BUN 10.5 09/03/2013 1208   BUN 6 08/01/2013 1323   CREATININE 0.5* 09/03/2013 1208   CREATININE 0.36* 08/01/2013 1323      Component Value Date/Time   CALCIUM 8.8 09/03/2013 1208   CALCIUM 8.5 08/01/2013 1323   ALKPHOS 607* 09/03/2013 1208   ALKPHOS 405* 08/01/2013 1323   AST 32 09/03/2013 1208   AST 30 08/01/2013 1323   ALT 39 09/03/2013 1208   ALT 22 08/01/2013 1323   BILITOT 0.48 09/03/2013 1208   BILITOT 0.4 08/01/2013 1323       Studies/Results: Ct Soft Tissue Neck W Contrast  08/19/2013   CLINICAL DATA:  Right neck, upper chest numbness. History of bony metastases related to cholangiocarcinoma.  EXAM: CT NECK WITH CONTRAST  TECHNIQUE: Multidetector CT imaging of the neck was performed using the standard protocol following the bolus administration of intravenous contrast.   CONTRAST:  70mL OMNIPAQUE IOHEXOL 300 MG/ML  SOLN  COMPARISON:  Prior chest CT from 05/15/2013.  FINDINGS: The visualized portions of the brain are unremarkable. Visualized orbits are within normal limits.  The paranasal sinuses and mastoid air cells are clear.  The salivary glands including the parotid glands and submandibular glands are normal.  Streak artifact from dental amalgam limits evaluation of the oral cavity. The oral cavity is grossly unremarkable without evidence of mass lesion or loculated fluid collection. Bilateral calcified tonsilliths are noted. Parapharyngeal fat is preserved. Oropharynx and nasopharynx are within normal limits. The hypopharynx and supraglottic larynx are normal. True vocal cords are symmetric bilaterally. Subglottic airway is normal.  Thyroid gland is within normal limits.  No pathologically enlarged lymph nodes are identified within the neck. No adenopathy identified within the visualized superior mediastinum.  Right-sided Port-A-Cath is noted.  The visualized lungs are clear.  A focal lytic lesion measuring 1.2 x 1.0 cm is seen within the right lateral mass of C1 (series 2, image 24). There is cortical destruction and breakthrough medially. Lytic lucency with cortical disruption seen within the right aspect of the dens (series 2, image 24). Soft tissue mass with osseous destruction and fragmentation with associated collapse and pathologic fracture involving predominantly the right aspect of the C2 vertebral body (series 2, image 31). There is involvement  of the right pedicle and lamina of C2. The left lateral mass of C1 is subluxed laterally relative to the lateral mass of C2. There is probable invasion of the right C2-3 neural foramen. Lucency also seen within the left aspect of C2, best appreciated on coronal sequence (series 602, image 59). Involvement of the right aspect of C3 inferiorly as well (Series 602, image 55). Findings are compatible with osseous metastases. The  vertebral arteries at these levels are opacified.  Additional osseous metastasis are seen inferiorly involving the C7, T2, and T3 vertebral bodies. Small lytic metastasis noted about the C4-5 facets bilaterally. Additional osseous metastatic lesions seen involving the posterolateral third ribs bilaterally.  IMPRESSION: 1. Destructive lytic metastasis involving the right aspect of the C2 vertebral body with extension into the right posterior elements and probable invasion of the right C2-3 neural foramen. There is associated pathologic fracture and collapse of the right lateral mass of C2. 2. Additional osseous metastatic lesions involving the right lateral mass of C1 as well as the dens. 3. Additional osseous metastases as above. Critical Value/emergent results were called by telephone at the time of interpretation on 08/19/2013 at 3:52 PM to Dr. Ned Card , who verbally acknowledged these results.   Electronically Signed   By: Jeannine Boga M.D.   On: 08/19/2013 16:02   Ct Abdomen Pelvis W Contrast  09/03/2013   CLINICAL DATA:  Cholangiocarcinoma with liver and bone metastasis. Restaging exam  EXAM: CT CHEST, ABDOMEN, AND PELVIS WITH CONTRAST  TECHNIQUE: Multidetector CT imaging of the chest, abdomen and pelvis was performed following the standard protocol during bolus administration of intravenous contrast.  CONTRAST:  188mL OMNIPAQUE IOHEXOL 300 MG/ML  SOLN  COMPARISON:  CT ABD/PELVIS W CM dated 06/28/2013; CT CHEST W/CM dated 05/15/2013  FINDINGS: CT CHEST FINDINGS  Port in the right anterior chest wall. No axillary or supraclavicular lymphadenopathy. No mediastinal or hilar lymphadenopathy. No pulmonary embolism. No pericardial fluid.  Review of the lung windows demonstrates small right effusion similar to prior. Interval resolution of the left effusion.  CT ABDOMEN AND PELVIS FINDINGS  Again demonstrated metastatic lesions in the left right hepatic lobe. There is progression of the metastatic burden  within the liver. For example small lesion in the superior right hepatic lobe measures 11 mm (image 41, series 2) compared to 7 mm on prior. Larger lesion in the anterior right hepatic lobe measures 28 x 30 mm compared to 28 x 24 mm on prior. Lesion in the posterior right hepatic lobe measures 25 x 25 mm was barely measurable at 9 mm on prior (image 49, series 2). More inferiorly 27 mm lesion is unchanged from 27 mm on prior. In the left hepatic lobe 25 mm lesion compares to 18 mm on prior (image 52, series 2).  There is a percutaneous biliary tube entering the left hepatic lobe. There is a wall stent in the common bile duct which is new from prior. No significant intrahepatic biliary duct dilatation evident.  The gallbladder continues to remain thick walled. There multiple gallstones within the lumen of the gallbladder. The gallbladder is distended to 15 mm.  The pancreas, spleen, adrenal glands, and kidneys are normal.  The stomach, small bowel, appendix, cecum normal. The colon and rectosigmoid colon are normal.  Smaller free fluid the pelvis. The dense calcification in the posterior cul-de-sac of unknown etiology. Uterus bladder normal. No pelvic lymphadenopathy.  Review of the bone windows demonstrates widespread lytic and sclerotic metastasis. A severe compression deformity  at L3 is again noted. Compared to CT of 05/15/2013 there is new sclerotic lesion in the T3 vertebral body seen on sagittal image 54 measuring 10 mm. Compared to this more remote scan there additional No sclerotic lesions within the lumbar spine. Compared to the more recent scan of 06/28/2013 a sclerotic lesion L4 as progressed  IMPRESSION: 1. No evidence of thoracic metastasis. 2. Unfortunately, there is progression of hepatic metastasis with enlargement of multiple lesions. 3. No evidence of biliary duct dilatation with percutaneous biliary drain and wall stent in place. 4. Mild progression of skeletal metastasis compared to 05/15/2013 and  06/28/2013. 5. Persistent dilatation of the gallbladder with gallbladder wall thickening multiple gallstones.   Electronically Signed   By: Suzy Bouchard M.D.   On: 09/03/2013 14:47    Medications: I have reviewed the patient's current medications.  Assessment/Plan: 1. 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 Staging chest CT 05/15/2013 showed a 1.3 cm soft tissue mass within the medial aspect of the right breast; 1.9 cm left supraclavicular lymph node; multiple enlarged mediastinal lymph nodes; 3 mm left upper lobe pulmonary nodule. Repeat CT abdomen and pelvis 05/15/2013 showed interval increase in the size of previously visualized lesions throughout the liver and multiple additional new lesions; bulky retroperitoneal lymphadenopathy was stable; extensive porta hepatis lymphadenopathy was redemonstrated; 7 mm soft tissue nodule in the subcutaneous fat of the anterior abdominal wall; 2.5 cm calcified lesion within the left hemipelvis; interval development of a pathologic compression fracture of the L3 vertebral body with extension of soft tissue components into the canal which was narrowed approximately 50%; metastatic involvement of the T9 vertebral body with small amount of extension into the vertebral canal; small amount of abnormal enhancing soft tissue at T7 with mild narrowing of the canal; destructive 2.6 cm lesion involving the lateral aspect of the left third rib. Palliative radiation to T9 and L3 on 05/21/2013  Cycle 1 gemcitabine/oxaliplatin on 05/22/2013  Cycle 2 gemcitabine/oxaliplatin on 06/05/2013  Cycle 3 gemcitabine/oxaliplatin on 06/19/2013  CT abdomen 06/28/2013 with improvement in the liver metastases and upper abdominal adenopathy, progression of and L3 compression fracture and a new left lateral rib lesion  Cycle 4  gemcitabine/oxaliplatin 07/08/2013.  Cycle 5 gemcitabine/oxaliplatin 07/22/2013.  Cycle 6 gemcitabine/oxaliplatin 08/05/2013.  Neck CT 08/19/2013 with a destructive lytic metastasis at the right side of C2 with a pathologic fracture, multiple additional bone metastases noted. CT chest/abdomen/pelvis 09/03/2013 with progression of hepatic metastasis with enlargement of multiple lesions and mild progression of skeletal metastasis. No evidence of biliary duct dilatation. Percutaneous biliary drain and wall stent in place. No evidence of thoracic metastasis. 09/03/2013 CEA 42.3. 2. Obstructive jaundice secondary to #1 status post placement of a percutaneous biliary drain on 05/02/2013. Cholangiogram 05/16/2013 showed the existing left internal/external biliary drain was well positioned. The biliary system was decompressed. The internal/external biliary drain was exchanged and a bile duct stent was placed on 07/15/2013. The jaundice has resolved. Removal of internal/external biliary drain 08/01/2013 with placement of an external biliary drain above the metallic stent. The tube was left capped. 3. Pain secondary to bone metastases involving the lumbar spine, retroperitoneal adenopathy. She continues to have pain at the right "hip ". The pain is likely related to the L3 compression fracture with nerve root compression She is taking MS Contin and MSIR.  Status post palliative radiation, one fraction, to the thoracic and lumbar spine metastases on  05/21/2013 4. Anorexia/weight loss secondary to #1 and pain 5. Subcutaneous lesion right scalp. Question lymph node. 6. Pain/tenderness at the left lower tibia. Lytic lesion confirmed on a plain x-ray 05/24/2013. Status post intramedullary nail on 05/28/2013, radiation on 06/12/2013. 7. Admission with sepsis syndrome 06/28/2013. Cultures negative. Likely cholangitis. Completed a course of Augmentin. 8. Left lower extremity edema. Doppler on 07/10/2013 confirmed a left  posterior tibial and popliteal DVT. She is maintained on Lovenox. 9. Sacral and thoracic decubitus ulcers. She is now followed by a home skin care nurse. 10. Hypercalcemia of malignancy status post Zometa 06/28/2013. Improved. 11. Tachycardia. 12. Numbness at the right neck and chest 08/19/2013. Likely secondary to the C2 fracture with neural compression. Currently completing palliative radiation to the neck.   Dispositon-clinical status appears unchanged. She is completing palliative radiation to the neck. Dr. Benay Spice recommends systemic therapy with Xeloda on a 1 week on/1 week off schedule. We reviewed potential toxicities associated with Xeloda including mouth sores, nausea, diarrhea, skin hyperpigmentation, skin rash, hand-foot syndrome. She is agreeable to proceed. We will tentatively plan for her to begin Xeloda on 10/03/2013. We will see her in followup on 09/23/2013. She and her husband understand contact the office in the interim with any problems.  Patient seen with Dr. Benay Spice. CT report and images were reviewed with them. 25 minutes were spent face-to-face at today's visit with the majority of that time involved in counseling/coordination of care.   Ned Card ANP/GNP-BC    This was a shared visit with Ned Card. There is clinical and x-ray evidence of disease progression. I recommend treatment with Xeloda. We reviewed the restaging CT images. We discussed the potential toxicities associated with Xeloda.  Bianca Logan, M.D.

## 2013-09-04 NOTE — Progress Notes (Signed)
Brief follow up with patient and husband.  They are waiting on results of recent scan to determine course of action.  Provided support and encouragement and encouraged them to contact me as needed.  Patient continues strategies for increasing oral intake.  She continues to have weight loss.

## 2013-09-05 ENCOUNTER — Ambulatory Visit
Admission: RE | Admit: 2013-09-05 | Discharge: 2013-09-05 | Disposition: A | Discharge status: Home / Self Care | Payer: BC Managed Care – PPO | Source: Ambulatory Visit | Attending: Radiation Oncology | Admitting: Radiation Oncology

## 2013-09-06 ENCOUNTER — Ambulatory Visit
Admission: RE | Admit: 2013-09-06 | Discharge: 2013-09-06 | Disposition: A | Discharge status: Home / Self Care | Payer: BC Managed Care – PPO | Source: Ambulatory Visit | Attending: Radiation Oncology | Admitting: Radiation Oncology

## 2013-09-06 ENCOUNTER — Other Ambulatory Visit: Payer: Self-pay | Admitting: *Deleted

## 2013-09-06 ENCOUNTER — Encounter: Payer: Self-pay | Admitting: Radiation Oncology

## 2013-09-06 ENCOUNTER — Encounter: Payer: Self-pay | Admitting: Oncology

## 2013-09-06 DIAGNOSIS — C7951 Secondary malignant neoplasm of bone: Secondary | ICD-10-CM

## 2013-09-06 DIAGNOSIS — C7952 Secondary malignant neoplasm of bone marrow: Principal | ICD-10-CM

## 2013-09-06 MED ORDER — CAPECITABINE 500 MG PO TABS: 1000.0000 mg | ORAL_TABLET | Freq: Two times a day (BID) | ORAL | Status: AC

## 2013-09-06 NOTE — Progress Notes (Signed)
Faxed xeloda prescription to [place] [facility] form to [facility].

## 2013-09-08 NOTE — Progress Notes (Signed)
  Radiation Oncology         [phone]) [phone] ________________________________  Name: Bianca Logan MRN: [id number]  Date: 09/06/2013  DOB: 1950-10-13  End of Treatment Note  Diagnosis:   Metastatic cholangiocarcinoma    Indication for treatment:  Palliative       Radiation treatment dates:   08/22/2013-09/06/2013  Site/dose:   C1-C4 spine ' 30 gy in 12 fractions at 2.5 Gy per fraction  Beams/energy:   Opposed laterals with 6 MV photons  Narrative: The patient tolerated radiation treatment relatively well.   She did not experience pharyngitis at the end of her treatment. Her pain was controlled on oral medications.   Plan: The patient has completed radiation treatment. The patient will return to radiation oncology clinic for routine followup in one month. I advised them to call or return sooner if they have any questions or concerns related to their recovery or treatment.  ------------------------------------------------  Thea Silversmith, MD

## 2013-09-09 ENCOUNTER — Encounter: Payer: Self-pay | Admitting: Oncology

## 2013-09-09 ENCOUNTER — Emergency Department (HOSPITAL_COMMUNITY): Payer: BC Managed Care – PPO

## 2013-09-09 ENCOUNTER — Inpatient Hospital Stay (HOSPITAL_COMMUNITY)
Admission: AD | Admit: 2013-09-09 | Discharge: 2013-10-09 | DRG: 435 | Disposition: E | Payer: BC Managed Care – PPO | Attending: Internal Medicine | Admitting: Internal Medicine

## 2013-09-09 ENCOUNTER — Encounter (HOSPITAL_COMMUNITY): Payer: Self-pay | Admitting: Emergency Medicine

## 2013-09-09 ENCOUNTER — Telehealth: Payer: Self-pay | Admitting: *Deleted

## 2013-09-09 DIAGNOSIS — R599 Enlarged lymph nodes, unspecified: Secondary | ICD-10-CM | POA: Diagnosis present

## 2013-09-09 DIAGNOSIS — E2749 Other adrenocortical insufficiency: Secondary | ICD-10-CM | POA: Diagnosis present

## 2013-09-09 DIAGNOSIS — E872 Acidosis, unspecified: Secondary | ICD-10-CM | POA: Diagnosis present

## 2013-09-09 DIAGNOSIS — C7951 Secondary malignant neoplasm of bone: Secondary | ICD-10-CM | POA: Diagnosis present

## 2013-09-09 DIAGNOSIS — C7952 Secondary malignant neoplasm of bone marrow: Secondary | ICD-10-CM

## 2013-09-09 DIAGNOSIS — E876 Hypokalemia: Secondary | ICD-10-CM | POA: Diagnosis not present

## 2013-09-09 DIAGNOSIS — E162 Hypoglycemia, unspecified: Secondary | ICD-10-CM | POA: Diagnosis not present

## 2013-09-09 DIAGNOSIS — M8448XA Pathological fracture, other site, initial encounter for fracture: Secondary | ICD-10-CM | POA: Diagnosis present

## 2013-09-09 DIAGNOSIS — D6959 Other secondary thrombocytopenia: Secondary | ICD-10-CM | POA: Diagnosis present

## 2013-09-09 DIAGNOSIS — J811 Chronic pulmonary edema: Secondary | ICD-10-CM | POA: Diagnosis present

## 2013-09-09 DIAGNOSIS — Z86718 Personal history of other venous thrombosis and embolism: Secondary | ICD-10-CM

## 2013-09-09 DIAGNOSIS — K8309 Other cholangitis: Secondary | ICD-10-CM

## 2013-09-09 DIAGNOSIS — R579 Shock, unspecified: Secondary | ICD-10-CM | POA: Diagnosis present

## 2013-09-09 DIAGNOSIS — R932 Abnormal findings on diagnostic imaging of liver and biliary tract: Secondary | ICD-10-CM

## 2013-09-09 DIAGNOSIS — R627 Adult failure to thrive: Secondary | ICD-10-CM | POA: Diagnosis present

## 2013-09-09 DIAGNOSIS — A419 Sepsis, unspecified organism: Secondary | ICD-10-CM | POA: Diagnosis present

## 2013-09-09 DIAGNOSIS — K1230 Oral mucositis (ulcerative), unspecified: Secondary | ICD-10-CM

## 2013-09-09 DIAGNOSIS — C419 Malignant neoplasm of bone and articular cartilage, unspecified: Secondary | ICD-10-CM

## 2013-09-09 DIAGNOSIS — R6520 Severe sepsis without septic shock: Secondary | ICD-10-CM

## 2013-09-09 DIAGNOSIS — K121 Other forms of stomatitis: Secondary | ICD-10-CM | POA: Diagnosis present

## 2013-09-09 DIAGNOSIS — E43 Unspecified severe protein-calorie malnutrition: Secondary | ICD-10-CM | POA: Diagnosis present

## 2013-09-09 DIAGNOSIS — K810 Acute cholecystitis: Secondary | ICD-10-CM | POA: Diagnosis present

## 2013-09-09 DIAGNOSIS — R110 Nausea: Secondary | ICD-10-CM

## 2013-09-09 DIAGNOSIS — L89109 Pressure ulcer of unspecified part of back, unspecified stage: Secondary | ICD-10-CM | POA: Diagnosis present

## 2013-09-09 DIAGNOSIS — D649 Anemia, unspecified: Secondary | ICD-10-CM | POA: Diagnosis present

## 2013-09-09 DIAGNOSIS — R64 Cachexia: Secondary | ICD-10-CM | POA: Diagnosis present

## 2013-09-09 DIAGNOSIS — C787 Secondary malignant neoplasm of liver and intrahepatic bile duct: Secondary | ICD-10-CM | POA: Diagnosis present

## 2013-09-09 DIAGNOSIS — R6510 Systemic inflammatory response syndrome (SIRS) of non-infectious origin without acute organ dysfunction: Secondary | ICD-10-CM

## 2013-09-09 DIAGNOSIS — R5383 Other fatigue: Secondary | ICD-10-CM

## 2013-09-09 DIAGNOSIS — I959 Hypotension, unspecified: Secondary | ICD-10-CM

## 2013-09-09 DIAGNOSIS — Z515 Encounter for palliative care: Secondary | ICD-10-CM

## 2013-09-09 DIAGNOSIS — R188 Other ascites: Secondary | ICD-10-CM | POA: Diagnosis present

## 2013-09-09 DIAGNOSIS — I498 Other specified cardiac arrhythmias: Secondary | ICD-10-CM | POA: Diagnosis present

## 2013-09-09 DIAGNOSIS — C221 Intrahepatic bile duct carcinoma: Principal | ICD-10-CM | POA: Diagnosis present

## 2013-09-09 DIAGNOSIS — M79609 Pain in unspecified limb: Secondary | ICD-10-CM | POA: Diagnosis present

## 2013-09-09 DIAGNOSIS — R040 Epistaxis: Secondary | ICD-10-CM | POA: Diagnosis not present

## 2013-09-09 DIAGNOSIS — G893 Neoplasm related pain (acute) (chronic): Secondary | ICD-10-CM | POA: Diagnosis present

## 2013-09-09 DIAGNOSIS — J029 Acute pharyngitis, unspecified: Secondary | ICD-10-CM | POA: Diagnosis not present

## 2013-09-09 DIAGNOSIS — E860 Dehydration: Secondary | ICD-10-CM

## 2013-09-09 DIAGNOSIS — J90 Pleural effusion, not elsewhere classified: Secondary | ICD-10-CM | POA: Diagnosis present

## 2013-09-09 DIAGNOSIS — Z79899 Other long term (current) drug therapy: Secondary | ICD-10-CM

## 2013-09-09 DIAGNOSIS — K219 Gastro-esophageal reflux disease without esophagitis: Secondary | ICD-10-CM | POA: Diagnosis present

## 2013-09-09 DIAGNOSIS — E8779 Other fluid overload: Secondary | ICD-10-CM | POA: Diagnosis present

## 2013-09-09 DIAGNOSIS — R6521 Severe sepsis with septic shock: Secondary | ICD-10-CM

## 2013-09-09 DIAGNOSIS — L8990 Pressure ulcer of unspecified site, unspecified stage: Secondary | ICD-10-CM | POA: Diagnosis present

## 2013-09-09 DIAGNOSIS — E871 Hypo-osmolality and hyponatremia: Secondary | ICD-10-CM

## 2013-09-09 DIAGNOSIS — Z87891 Personal history of nicotine dependence: Secondary | ICD-10-CM

## 2013-09-09 DIAGNOSIS — I82509 Chronic embolism and thrombosis of unspecified deep veins of unspecified lower extremity: Secondary | ICD-10-CM | POA: Diagnosis present

## 2013-09-09 DIAGNOSIS — R5381 Other malaise: Secondary | ICD-10-CM | POA: Diagnosis present

## 2013-09-09 DIAGNOSIS — Z923 Personal history of irradiation: Secondary | ICD-10-CM

## 2013-09-09 DIAGNOSIS — IMO0002 Reserved for concepts with insufficient information to code with codable children: Secondary | ICD-10-CM

## 2013-09-09 DIAGNOSIS — Z7901 Long term (current) use of anticoagulants: Secondary | ICD-10-CM

## 2013-09-09 LAB — CBC WITH DIFFERENTIAL/PLATELET
BASOS ABS: 0 10*3/uL (ref 0.0–0.1)
Basophils Relative: 0 % (ref 0–1)
EOS PCT: 1 % (ref 0–5)
Eosinophils Absolute: 0.1 10*3/uL (ref 0.0–0.7)
HCT: 33.6 % — ABNORMAL LOW (ref 36.0–46.0)
Hemoglobin: 11.7 g/dL — ABNORMAL LOW (ref 12.0–15.0)
LYMPHS ABS: 1 10*3/uL (ref 0.7–4.0)
Lymphocytes Relative: 10 % — ABNORMAL LOW (ref 12–46)
MCH: 27.1 pg (ref 26.0–34.0)
MCHC: 34.8 g/dL (ref 30.0–36.0)
MCV: 78 fL (ref 78.0–100.0)
MONO ABS: 0.5 10*3/uL (ref 0.1–1.0)
Monocytes Relative: 5 % (ref 3–12)
NEUTROS PCT: 84 % — AB (ref 43–77)
Neutro Abs: 8.8 10*3/uL — ABNORMAL HIGH (ref 1.7–7.7)
PLATELETS: 120 10*3/uL — AB (ref 150–400)
RBC: 4.31 MIL/uL (ref 3.87–5.11)
RDW: 16 % — AB (ref 11.5–15.5)
WBC: 10.4 10*3/uL (ref 4.0–10.5)

## 2013-09-09 LAB — COMPREHENSIVE METABOLIC PANEL
ALT: 39 U/L — ABNORMAL HIGH (ref 0–35)
AST: 35 U/L (ref 0–37)
Albumin: 1.8 g/dL — ABNORMAL LOW (ref 3.5–5.2)
Alkaline Phosphatase: 578 U/L — ABNORMAL HIGH (ref 39–117)
BUN: 16 mg/dL (ref 6–23)
CALCIUM: 8.2 mg/dL — AB (ref 8.4–10.5)
CHLORIDE: 86 meq/L — AB (ref 96–112)
CO2: 18 mEq/L — ABNORMAL LOW (ref 19–32)
CREATININE: 0.53 mg/dL (ref 0.50–1.10)
GFR calc Af Amer: >90 mL/min (ref >90)
GFR calc non Af Amer: >90 mL/min (ref >90)
Glucose, Bld: 80 mg/dL (ref 70–99)
Potassium: 4.7 mEq/L (ref 3.7–5.3)
Sodium: 121 mEq/L — CL (ref 137–147)
TOTAL PROTEIN: 5.5 g/dL — AB (ref 6.0–8.3)
Total Bilirubin: 0.6 mg/dL (ref 0.3–1.2)

## 2013-09-09 LAB — TROPONIN I

## 2013-09-09 LAB — POC OCCULT BLOOD, ED: Fecal Occult Bld: NEGATIVE

## 2013-09-09 LAB — LACTIC ACID, PLASMA: Lactic Acid, Venous: 3.6 mmol/L — ABNORMAL HIGH (ref 0.5–2.2)

## 2013-09-09 MED ORDER — FENTANYL CITRATE 0.05 MG/ML IJ SOLN
50.0000 ug | Freq: Once | INTRAMUSCULAR | Status: AC
  Administered 2013-09-10: 50 ug via INTRAVENOUS
  Filled 2013-09-09: qty 2

## 2013-09-09 MED ORDER — SODIUM CHLORIDE 0.9 % IV BOLUS (SEPSIS)
1000.0000 mL | Freq: Once | INTRAVENOUS | Status: AC
  Administered 2013-09-10: 1000 mL via INTRAVENOUS

## 2013-09-09 MED ORDER — PIPERACILLIN-TAZOBACTAM 3.375 G IVPB 30 MIN
3.3750 g | Freq: Once | INTRAVENOUS | Status: AC
  Administered 2013-09-10: 3.375 g via INTRAVENOUS
  Filled 2013-09-09: qty 50

## 2013-09-09 MED ORDER — SODIUM CHLORIDE 0.9 % IV BOLUS (SEPSIS)
1000.0000 mL | Freq: Once | INTRAVENOUS | Status: AC
  Administered 2013-09-09: 1000 mL via INTRAVENOUS

## 2013-09-09 MED ORDER — SODIUM CHLORIDE 0.9 % IV SOLN
1.0000 g | Freq: Once | INTRAVENOUS | Status: AC
  Administered 2013-09-10: 1 g via INTRAVENOUS
  Filled 2013-09-09: qty 10

## 2013-09-09 NOTE — Telephone Encounter (Signed)
Called and informed patient's husband that patient needs to start Xeloda tomorrow 09/10/13.  Per Dr. Benay Spice.   Patient's husband verbalized understanding.

## 2013-09-09 NOTE — Telephone Encounter (Signed)
[place] on behalf of patient.  The Xeloda is ready for pick-up.  Patient's husband was informed.

## 2013-09-09 NOTE — Progress Notes (Signed)
[facility] approved xeloda from 09/06/13-09/07/15 °

## 2013-09-09 NOTE — ED Provider Notes (Signed)
CSN: [id number]     Arrival date & time 09/15/2013  2055 History   First MD Initiated Contact with Patient 10/01/2013 2058     Chief Complaint  Patient presents with  . Weakness     (Consider location/radiation/quality/duration/timing/severity/associated sxs/prior Treatment) HPI Comments: 63 yo female with stage IV cholangiocarcinoma, DVT, on lovenox, Dr Learta Codding oncology, Finished 12 days of radiation neck on Friday presents with fatigue, lethargy worsening the past week, decreased po intake.  No bleeding.  No fever or neck stiffness. No current abx.  Sxs constant.  The history is provided by the patient and the spouse.    Past Medical History  Diagnosis Date  . 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 GERD (gastroesophageal reflux disease)   . Cancer 05/02/13    cholangiocarcinoma  . Bone cancer 05/15/13    Bone mets  . Contact lens/glasses fitting     wears contacts or glasses  . S/P radiation therapy 06/12/13    Left femur 8 Gy single fraction  . S/P radiation therapy 05/21/2013     T6-T10 and L2-L4 received 8 Gy in a single fraction for pain contro  . Hypercalcemia 07/08/2013   Past Surgical History  Procedure Laterality Date  . Cyst removed from left breast  01/1999  . Ercp N/A 05/02/2013    Procedure: ENDOSCOPIC RETROGRADE CHOLANGIOPANCREATOGRAPHY (ERCP);  Surgeon: Arta Silence, MD;  Location: [facility];  Service: Endoscopy;  Laterality: N/A;  . Biliary stent placement N/A 05/02/2013    Procedure: BILIARY STENT PLACEMENT;  Surgeon: Arta Silence, MD;  Location: [facility];  Service: Endoscopy;  Laterality: N/A;  . Eus N/A 05/02/2013    Procedure: FULL UPPER ENDOSCOPIC ULTRASOUND (EUS) RADIAL;  Surgeon: Arta Silence, MD;  Location: [facility];  Service: Endoscopy;  Laterality: N/A;  . Breast surgery  2000    lt br cyst  . Tibia im nail insertion Left 05/28/2013    Procedure: INTRAMEDULLARY (IM) NAIL LEFT TIBIAL;  Surgeon: Hessie Dibble, MD;  Location: [facility];  Service: Orthopedics;  Laterality: Left;   Family History  Problem Relation Age of Onset  . Cancer Mother     Undetermined  . Heart attack Father   . Cancer Father     prostate  . Anuerysm Sister   . Obesity Brother    History  Substance Use Topics  . Smoking status: Former Smoker -- 1.50 packs/day for 12 years    Types: Cigarettes    Start date: 07/11/1984    Quit date: 04/26/1985  . Smokeless tobacco: Never Used  . Alcohol Use: 2.4 oz/week    2 Glasses of wine, 2 Cans of beer per week     Comment: social    OB History   Grav Para Term Preterm Abortions TAB SAB Ect Mult Living                 Review of Systems  Constitutional: Positive for appetite change. Negative for fever and chills.  HENT: Negative for congestion.   Eyes: Negative for visual disturbance.  Respiratory: Positive for shortness of breath. Negative for cough.   Cardiovascular: Negative for chest pain.  Gastrointestinal: Positive for nausea and abdominal pain (epig). Negative for vomiting and blood in stool.  Genitourinary: Negative for dysuria and flank pain.  Musculoskeletal: Positive for arthralgias. Negative for back pain, neck pain and neck stiffness.  Skin: Negative for rash.  Neurological: Positive for light-headedness. Negative for headaches.      Allergies  Review of  patient's allergies indicates no known allergies.  Home Medications   Current Outpatient Rx  Name  Route  Sig  Dispense  Refill  . Alum & Mag Hydroxide-Simeth (MAGIC MOUTHWASH W/LIDOCAINE) SOLN   Oral   Take 5 mLs by mouth 3 (three) times daily as needed for mouth pain.   480 mL   1     80 cc each of benadryl, nystatin, maalox. Mix 1:1  ...   . enoxaparin (LOVENOX) 100 MG/ML injection   Subcutaneous   Inject 0.9 mLs (90 mg total) into the skin daily. Inject 90 mg to abdomen daily   30 Syringe   3   . lidocaine-prilocaine (EMLA) cream   Topical   Apply 1 application topically daily as needed  (for pain on port).         . morphine (MS CONTIN) 100 MG 12 hr tablet   Oral   Take 1 tablet (100 mg total) by mouth every 12 (twelve) hours.   60 tablet   0   . morphine (MSIR) 30 MG tablet   Oral   Take 1 tablet (30 mg total) by mouth every 4 (four) hours as needed for moderate pain or severe pain.   100 tablet   0   . omeprazole (PRILOSEC) 20 MG capsule   Oral   Take 1 capsule (20 mg total) by mouth 2 (two) times daily before a meal.   180 capsule   1   . ondansetron (ZOFRAN) 4 MG tablet   Oral   Take 4 mg by mouth every 6 (six) hours as needed for nausea or vomiting.         . potassium chloride SA (K-DUR,KLOR-CON) 20 MEQ tablet   Oral   Take 1 tablet (20 mEq total) by mouth daily.   90 tablet   1   . sennosides-docusate sodium (SENOKOT-S) 8.6-50 MG tablet   Oral   Take 1 tablet by mouth 2 (two) times daily.         . Sodium Chloride Flush (NORMAL SALINE FLUSH) 0.9 % SOLN      5 mLs daily.          . capecitabine (XELODA) 500 MG tablet   Oral   Take 2 tablets (1,000 mg total) by mouth 2 (two) times daily after a meal. Take days 1-7, days 15-21=14 days/month   56 tablet   1     Script given to managed care department   . polyethylene glycol (MIRALAX / GLYCOLAX) packet   Oral   Take 17 g by mouth daily as needed for mild constipation or moderate constipation.          . prochlorperazine (COMPAZINE) 10 MG tablet   Oral   Take 10 mg by mouth every 6 (six) hours as needed for nausea or vomiting.          BP 88/51  Pulse 133  Temp(Src) 98.3 F (36.8 C) (Oral)  Resp 22  SpO2 100% Physical Exam  Nursing note and vitals reviewed. Constitutional: She appears well-developed and well-nourished.  HENT:  Head: Normocephalic and atraumatic.  Moderate dry mm Neck supple  Eyes: Conjunctivae are normal. Right eye exhibits no discharge. Left eye exhibits no discharge.  Neck: Normal range of motion. Neck supple. No tracheal deviation present.   Cardiovascular: Regular rhythm.  Tachycardia present.   Pulmonary/Chest: Effort normal. She has rales (few bilateral bases).  Abdominal: Soft. She exhibits no distension. There is tenderness (mild epig). There is no  guarding.  Musculoskeletal: She exhibits no edema.  Neurological: She is alert. GCS eye subscore is 4. GCS verbal subscore is 4. GCS motor subscore is 6.  Moves all ext equal with 4+ strength Fatigue appearance, mild confusion Alert/ oriented Perrl, eomfi  Skin: Skin is warm. No rash noted.  Psychiatric: She has a normal mood and affect.    ED Course  Procedures (including critical care time)   EMERGENCY DEPARTMENT [place] CARDIAC EXAM "Study: Limited Ultrasound of the heart and pericardium"  INDICATIONS:Hypotension, Tachycardia and Dyspnea Multiple views of the heart and pericardium were obtained in real-time with a multi-frequency probe.  PERFORMED YE:[id number]  IMAGES ARCHIVED?: Yes  FINDINGS: No pericardial effusion, Hyperdynamic contractility and Tamponade physiology absent  VIEWS USED: Subcostal 4 chamber, Parasternal long axis and Parasternal short axis  INTERPRETATION: Cardiac activity present, Pericardial effusioin absent and Increased contractility  CRITICAL CARE Performed by: Mariea Clonts Total critical care time: 40 min  Critical care time was exclusive of separately billable procedures and treating other patients.  Critical care was necessary to treat or prevent imminent or life-threatening deterioration.  Critical care was time spent personally by me on the following activities: development of treatment plan with patient and/or surrogate as well as nursing, discussions with consultants, evaluation of patient's response to treatment, examination of patient, obtaining history from patient or surrogate, ordering and performing treatments and interventions, ordering and review of laboratory studies, ordering and review of radiographic studies, pulse oximetry  and re-evaluation of patient's condition.   Labs Review Labs Reviewed  COMPREHENSIVE METABOLIC PANEL - Abnormal; Notable for the following:    Sodium 121 (*)    Chloride 86 (*)    CO2 18 (*)    Calcium 8.2 (*)    Total Protein 5.5 (*)    Albumin 1.8 (*)    ALT 39 (*)    Alkaline Phosphatase 578 (*)    All other components within normal limits  LACTIC ACID, PLASMA - Abnormal; Notable for the following:    Lactic Acid, Venous 3.6 (*)    All other components within normal limits  CBC WITH DIFFERENTIAL - Abnormal; Notable for the following:    Hemoglobin 11.7 (*)    HCT 33.6 (*)    RDW 16.0 (*)    Platelets 120 (*)    Neutrophils Relative % 84 (*)    Lymphocytes Relative 10 (*)    Neutro Abs 8.8 (*)    All other components within normal limits  TROPONIN I  URINALYSIS, ROUTINE W REFLEX MICROSCOPIC  OCCULT BLOOD X 1 CARD TO LAB, STOOL  POC OCCULT BLOOD, ED   Imaging Review Dg Chest Port 1 View  09/23/2013   CLINICAL DATA:  Weakness.  Hypoxia.  Cholangiocarcinoma.  EXAM: PORTABLE CHEST - 1 VIEW  COMPARISON:  09/03/2013 CT.  06/27/2013 chest x-ray  FINDINGS: Small right-sided pleural effusion and right base atelectasis suspected.  Right central line tip distal superior vena cava level.  Sclerotic bony expansion left second rib unchanged. Question metastatic lesion versus result of prior trauma.  Heart size within normal limits.  No pneumothorax.  IMPRESSION: Small right-sided pleural effusion and right base atelectasis suspected.  Right central line tip distal superior vena cava level.  Sclerotic bony expansion left second rib unchanged. Question metastatic lesion versus result of prior trauma.   Electronically Signed   By: Chauncey Cruel M.D.   On: 10/06/2013 22:05     EKG Interpretation   Date/Time:  Monday September 09 2013 21:21:42 EST  Ventricular Rate:  133 PR Interval:  106 QRS Duration: 79 QT Interval:  287 QTC Calculation: 427 R Axis:   102 Text Interpretation:  Sinus  tachycardia Confirmed by Ravonda Brecheen  MD, Laney Louderback  ([id number]) on 09/19/2013 9:42:08 PM      MDM   Final diagnoses:  Hypotension  Dehydration  Nausea  Stage IV cholangiocarcinoma  Hyponatremia   Hypotension bp in 37D systolic on arrival and tachycardia, clinically dehydrated with CA hx. Bedside US hyperdynamic heart, no significant pericardial effusion. EKG no acute ischemic changes.   Fluid bolus with 2 IVs, using port and one peripheral. Repeat fluid bolus times 4 L.  BP improved to 42A systolic.   Low grade temp 100, broad abx/ cultures for possible sepsis, urine pending. Multiple rechecks, mild improvement.   CT chest to look for PE/ pneumonia, CT abdo with pain/ look for source of fever. Pt is on lovenox at home. Filed Vitals:   10/02/2013 2257 10/02/2013 2353 09/10/13 0000 09/10/13 0026  BP: 88/51 95/50    Pulse: 133 132    Temp:   100 F (37.8 C)   TempSrc:   Rectal   Resp: 22 20    Weight:    103 lb (46.72 kg)  SpO2: 100% 97%      Signed out to admit to likely step down. CT pending.     Mariea Clonts, MD 09/10/13 [phone]

## 2013-09-09 NOTE — ED Notes (Signed)
Bed: WA20 Expected date: 09/08/2013 Expected time: 8:35 PM Means of arrival: Ambulance Comments: Weakness/cancer pt

## 2013-09-09 NOTE — ED Notes (Signed)
Per EMS pt reports attempting to used bathroom and feeling weak. Per pt family pt has not been having adequate food intake. Pt is cancer pt and last had chemo two weeks ago and has radiology apt this Friday. Pt is incont, alert and oriented.

## 2013-09-10 ENCOUNTER — Inpatient Hospital Stay (HOSPITAL_COMMUNITY): Payer: BC Managed Care – PPO

## 2013-09-10 ENCOUNTER — Encounter (HOSPITAL_COMMUNITY): Payer: Self-pay

## 2013-09-10 DIAGNOSIS — I959 Hypotension, unspecified: Secondary | ICD-10-CM

## 2013-09-10 DIAGNOSIS — R579 Shock, unspecified: Secondary | ICD-10-CM | POA: Diagnosis present

## 2013-09-10 DIAGNOSIS — A419 Sepsis, unspecified organism: Secondary | ICD-10-CM

## 2013-09-10 LAB — AMYLASE: Amylase: 149 U/L — ABNORMAL HIGH (ref 0–105)

## 2013-09-10 LAB — PROCALCITONIN: PROCALCITONIN: 3.65 ng/mL

## 2013-09-10 LAB — BASIC METABOLIC PANEL
BUN: 12 mg/dL (ref 6–23)
CALCIUM: 7.6 mg/dL — AB (ref 8.4–10.5)
CO2: 16 mEq/L — ABNORMAL LOW (ref 19–32)
Chloride: 96 mEq/L (ref 96–112)
Creatinine, Ser: 0.42 mg/dL — ABNORMAL LOW (ref 0.50–1.10)
GFR calc Af Amer: >90 mL/min (ref >90)
GFR calc non Af Amer: >90 mL/min (ref >90)
GLUCOSE: 65 mg/dL — AB (ref 70–99)
Potassium: 4.2 mEq/L (ref 3.7–5.3)
Sodium: 126 mEq/L — ABNORMAL LOW (ref 137–147)

## 2013-09-10 LAB — PHOSPHORUS: Phosphorus: 3.6 mg/dL (ref 2.3–4.6)

## 2013-09-10 LAB — MRSA PCR SCREENING: MRSA by PCR: NEGATIVE

## 2013-09-10 LAB — LIPASE, BLOOD: Lipase: 115 U/L — ABNORMAL HIGH (ref 11–59)

## 2013-09-10 LAB — URINALYSIS, ROUTINE W REFLEX MICROSCOPIC
Bilirubin Urine: NEGATIVE
Glucose, UA: NEGATIVE mg/dL
Hgb urine dipstick: NEGATIVE
Ketones, ur: NEGATIVE mg/dL
LEUKOCYTES UA: NEGATIVE
NITRITE: NEGATIVE
PH: 6 (ref 5.0–8.0)
Protein, ur: NEGATIVE mg/dL
SPECIFIC GRAVITY, URINE: 1.012 (ref 1.005–1.030)
UROBILINOGEN UA: 1 mg/dL (ref 0.0–1.0)

## 2013-09-10 LAB — TROPONIN I
Troponin I: <0.3 ng/mL (ref <0.30)
Troponin I: <0.3 ng/mL (ref <0.30)

## 2013-09-10 LAB — LACTIC ACID, PLASMA: Lactic Acid, Venous: 1.7 mmol/L (ref 0.5–2.2)

## 2013-09-10 LAB — MAGNESIUM: Magnesium: 1.6 mg/dL (ref 1.5–2.5)

## 2013-09-10 LAB — OSMOLALITY, URINE: Osmolality, Ur: 322 mOsm/kg — ABNORMAL LOW (ref 390–1090)

## 2013-09-10 LAB — I-STAT CG4 LACTIC ACID, ED: LACTIC ACID, VENOUS: 2.01 mmol/L (ref 0.5–2.2)

## 2013-09-10 LAB — CORTISOL: CORTISOL PLASMA: 9.1 ug/dL

## 2013-09-10 MED ORDER — MORPHINE SULFATE ER 100 MG PO TBCR
100.0000 mg | EXTENDED_RELEASE_TABLET | Freq: Two times a day (BID) | ORAL | Status: DC
  Administered 2013-09-10 (×2): 100 mg via ORAL
  Filled 2013-09-10 (×2): qty 1

## 2013-09-10 MED ORDER — SODIUM CHLORIDE 0.9 % IV SOLN: 250.0000 mL | INTRAVENOUS | Status: DC | PRN

## 2013-09-10 MED ORDER — PIPERACILLIN-TAZOBACTAM 3.375 G IVPB
3.3750 g | Freq: Three times a day (TID) | INTRAVENOUS | Status: DC
  Administered 2013-09-10 – 2013-09-15 (×17): 3.375 g via INTRAVENOUS
  Filled 2013-09-10 (×21): qty 50

## 2013-09-10 MED ORDER — HYDROMORPHONE HCL PF 1 MG/ML IJ SOLN
1.0000 mg | INTRAMUSCULAR | Status: DC | PRN
  Administered 2013-09-10 (×2): 1 mg via INTRAVENOUS
  Administered 2013-09-10: 2 mg via INTRAVENOUS
  Administered 2013-09-10 – 2013-09-11 (×7): 1 mg via INTRAVENOUS
  Filled 2013-09-10 (×3): qty 1
  Filled 2013-09-10: qty 2
  Filled 2013-09-10 (×3): qty 1
  Filled 2013-09-10: qty 2
  Filled 2013-09-10 (×2): qty 1

## 2013-09-10 MED ORDER — TECHNETIUM TC 99M MEBROFENIN IV KIT
4.9000 | PACK | Freq: Once | INTRAVENOUS | Status: AC | PRN
  Administered 2013-09-10: 4.9 via INTRAVENOUS

## 2013-09-10 MED ORDER — IOHEXOL 300 MG/ML  SOLN
50.0000 mL | Freq: Once | INTRAMUSCULAR | Status: AC | PRN
  Administered 2013-09-10: 50 mL via ORAL

## 2013-09-10 MED ORDER — ENOXAPARIN SODIUM 80 MG/0.8ML ~~LOC~~ SOLN
70.0000 mg | SUBCUTANEOUS | Status: DC
  Administered 2013-09-10 – 2013-09-13 (×4): 70 mg via SUBCUTANEOUS
  Filled 2013-09-10 (×5): qty 0.8

## 2013-09-10 MED ORDER — FENTANYL CITRATE 0.05 MG/ML IJ SOLN
INTRAMUSCULAR | Status: AC
  Filled 2013-09-10: qty 6

## 2013-09-10 MED ORDER — PROCHLORPERAZINE MALEATE 10 MG PO TABS
10.0000 mg | ORAL_TABLET | Freq: Four times a day (QID) | ORAL | Status: DC | PRN
  Filled 2013-09-10: qty 1

## 2013-09-10 MED ORDER — VANCOMYCIN HCL IN DEXTROSE 750-5 MG/150ML-% IV SOLN
750.0000 mg | Freq: Once | INTRAVENOUS | Status: AC
  Administered 2013-09-10: 750 mg via INTRAVENOUS
  Filled 2013-09-10: qty 150

## 2013-09-10 MED ORDER — ENOXAPARIN SODIUM 100 MG/ML ~~LOC~~ SOLN: 90.0000 mg | SUBCUTANEOUS | Status: DC

## 2013-09-10 MED ORDER — VANCOMYCIN HCL 500 MG IV SOLR
500.0000 mg | Freq: Two times a day (BID) | INTRAVENOUS | Status: DC
  Administered 2013-09-10 – 2013-09-12 (×4): 500 mg via INTRAVENOUS
  Filled 2013-09-10 (×4): qty 500

## 2013-09-10 MED ORDER — PHENYLEPHRINE HCL 10 MG/ML IJ SOLN
30.0000 ug/min | INTRAVENOUS | Status: DC
  Administered 2013-09-10 (×2): 100 ug/min via INTRAVENOUS
  Administered 2013-09-10: 180 ug/min via INTRAVENOUS
  Filled 2013-09-10 (×3): qty 2

## 2013-09-10 MED ORDER — PHENYLEPHRINE HCL 10 MG/ML IJ SOLN
30.0000 ug/min | INTRAVENOUS | Status: DC
  Administered 2013-09-10: 180 ug/min via INTRAVENOUS
  Administered 2013-09-11 (×3): 200 ug/min via INTRAVENOUS
  Administered 2013-09-11: 180 ug/min via INTRAVENOUS
  Administered 2013-09-11: 200 ug/min via INTRAVENOUS
  Administered 2013-09-12: 80 ug/min via INTRAVENOUS
  Filled 2013-09-10 (×8): qty 4

## 2013-09-10 MED ORDER — IOHEXOL 350 MG/ML SOLN
80.0000 mL | Freq: Once | INTRAVENOUS | Status: AC | PRN
  Administered 2013-09-10: 80 mL via INTRAVENOUS

## 2013-09-10 MED ORDER — SODIUM CHLORIDE 0.9 % IV BOLUS (SEPSIS)
1000.0000 mL | INTRAVENOUS | Status: AC
  Administered 2013-09-10: 1000 mL via INTRAVENOUS

## 2013-09-10 MED ORDER — MIDAZOLAM HCL 2 MG/2ML IJ SOLN
INTRAMUSCULAR | Status: AC
  Filled 2013-09-10: qty 6

## 2013-09-10 MED ORDER — FENTANYL CITRATE 0.05 MG/ML IJ SOLN
25.0000 ug | INTRAMUSCULAR | Status: DC | PRN
  Administered 2013-09-10 (×4): 100 ug via INTRAVENOUS
  Administered 2013-09-11: 25 ug via INTRAVENOUS
  Administered 2013-09-11 (×2): 100 ug via INTRAVENOUS
  Administered 2013-09-11: 75 ug via INTRAVENOUS
  Filled 2013-09-10 (×7): qty 2

## 2013-09-10 MED ORDER — SODIUM CHLORIDE 0.9 % IV SOLN
INTRAVENOUS | Status: DC
  Administered 2013-09-10 – 2013-09-11 (×2): via INTRAVENOUS
  Administered 2013-09-11: 1000 mL via INTRAVENOUS
  Administered 2013-09-12 – 2013-09-14 (×5): via INTRAVENOUS

## 2013-09-10 MED ORDER — HYDROMORPHONE HCL PF 1 MG/ML IJ SOLN
1.0000 mg | INTRAMUSCULAR | Status: DC | PRN
  Administered 2013-09-10 (×2): 1 mg via INTRAVENOUS
  Filled 2013-09-10 (×2): qty 1

## 2013-09-10 MED ORDER — PHENYLEPHRINE HCL 10 MG/ML IJ SOLN
30.0000 ug/min | INTRAMUSCULAR | Status: DC
  Administered 2013-09-10: 30 ug/min via INTRAVENOUS
  Administered 2013-09-10: 80 ug/min via INTRAVENOUS
  Filled 2013-09-10 (×3): qty 1

## 2013-09-10 MED ORDER — SENNA-DOCUSATE SODIUM 8.6-50 MG PO TABS
1.0000 | ORAL_TABLET | Freq: Two times a day (BID) | ORAL | Status: DC
  Filled 2013-09-10 (×2): qty 1

## 2013-09-10 MED ORDER — FENTANYL CITRATE 0.05 MG/ML IJ SOLN
50.0000 ug | Freq: Once | INTRAMUSCULAR | Status: AC
  Administered 2013-09-10: 50 ug via INTRAVENOUS
  Filled 2013-09-10: qty 2

## 2013-09-10 MED ORDER — BOOST / RESOURCE BREEZE PO LIQD
1.0000 | Freq: Two times a day (BID) | ORAL | Status: DC
  Administered 2013-09-10 – 2013-09-14 (×3): 1 via ORAL

## 2013-09-10 MED ORDER — JUVEN PO PACK
1.0000 | PACK | Freq: Two times a day (BID) | ORAL | Status: DC
  Administered 2013-09-11 – 2013-09-14 (×4): 1 via ORAL
  Filled 2013-09-10 (×14): qty 1

## 2013-09-10 MED ORDER — HYDROMORPHONE HCL PF 1 MG/ML IJ SOLN
0.5000 mg | Freq: Once | INTRAMUSCULAR | Status: AC
  Administered 2013-09-10: 0.5 mg via INTRAVENOUS
  Filled 2013-09-10: qty 1

## 2013-09-10 MED ORDER — HYDROMORPHONE HCL PF 1 MG/ML IJ SOLN
0.5000 mg | Freq: Once | INTRAMUSCULAR | Status: DC
  Administered 2013-09-10: 0.5 mg via INTRAVENOUS
  Filled 2013-09-10: qty 1

## 2013-09-10 NOTE — Progress Notes (Signed)
INITIAL NUTRITION ASSESSMENT  DOCUMENTATION CODES Per approved criteria  -Not Applicable   INTERVENTION: - Diet advancement per MD - Resource Breeze BID - Juven 1 packet BID - Will continue to monitor   NUTRITION DIAGNOSIS: Inadequate oral intake related to clear liquid diet as evidenced by diet order.   Goal: Advance diet as tolerated to regular diet  Monitor:  Weights, labs, diet advancement  Reason for Assessment: Malnutrition screening tool   63 y.o. female  Admitting Dx: Weakness, shock   ASSESSMENT: Pt with stage IV cholangiocarcinoma, malignant obstructive jaundice, and DVT. Finished 12 days of radiation on Friday. Presents with fatigue, lethargy worsening the past week, decreased po intake. Has been followed by [facility]. Pt with severe hypotension per MD notes and severe sepsis/septic shock.   Met with pt and husband who report pt's intake has gone down to 30% of usual in the past week. Pt has been consuming things like yogurt, peanut butter and jelly, fruit, baked vegetables, and a juice drink with added protein. Had some pork chops with baked potatoes 2 nights ago. Admits to having nausea in the past week but did not take anything for it. Noted pt's weight up 8 pounds in the past month however both pt and husband attribute this to fluid gain as pt's intake has been so poor. Pt not interested in trying any of the clear liquids at this time but is interested in trying [employer] later on today, provided samples at bedside. Did not complete nutrition focused physical exam at this time as pt appeared uncomfortable and in pain, RN aware.   Sodium low Alk phos elevated Amylase and lipase elevated ALT elevated  Total bilirubin WNL  Height: Ht Readings from Last 1 Encounters:  09/10/13 5' 3"  (1.6 m)    Weight: Wt Readings from Last 1 Encounters:  09/10/13 119 lb 7.8 oz (54.2 kg)    Ideal Body Weight: 115 lb   % Ideal Body Weight: 103%  Wt  Readings from Last 10 Encounters:  09/10/13 119 lb 7.8 oz (54.2 kg)  08/27/13 103 lb (46.72 kg)  08/21/13 110 lb (49.896 kg)  08/20/13 111 lb (50.349 kg)  08/19/13 111 lb 4.8 oz (50.485 kg)  08/05/13 115 lb 9.6 oz (52.436 kg)  07/22/13 117 lb (53.071 kg)  06/28/13 128 lb 12 oz (58.4 kg)  06/19/13 126 lb 1.6 oz (57.199 kg)  06/05/13 128 lb 3.2 oz (58.151 kg)    Usual Body Weight: 111 lb   % Usual Body Weight: 107%  BMI:  Body mass index is 21.17 kg/(m^2).  Estimated Nutritional Needs: Kcal: 9622-2979 Protein: 75-85g Fluid: 1.6-1.8L/day  Skin: Skin tear on sacrum, jaundice, stage III back pressure ulcer   Diet Order: Clear Liquid  EDUCATION NEEDS: -No education needs identified at this time   Intake/Output Summary (Last 24 hours) at 09/10/13 0942 Last data filed at 09/10/13 0900  Gross per 24 hour  Intake 1411.75 ml  Output    302 ml  Net 1109.75 ml    Last BM: 3/3  Labs:   Recent Labs Lab 09/03/13 1208 09/08/2013 2209 09/10/13 0635  NA 124* 121* 126*  K 4.5 4.7 4.2  CL  --  86* 96  CO2 21* 18* 16*  BUN 10.5 16 12   CREATININE 0.5* 0.53 0.42*  CALCIUM 8.8 8.2* 7.6*  MG  --   --  1.6  PHOS  --   --  3.6  GLUCOSE 108 80 65*  CBG (last 3)  No results found for this basename: GLUCAP,  in the last 72 hours  Scheduled Meds: . enoxaparin (LOVENOX) injection  70 mg Subcutaneous Q24H  . morphine  100 mg Oral Q12H  . piperacillin-tazobactam (ZOSYN)  IV  3.375 g Intravenous Q8H  . vancomycin  500 mg Intravenous Q12H    Continuous Infusions: . sodium chloride 125 mL/hr at 09/10/13 0900  . phenylephrine (NEO-SYNEPHRINE) Adult infusion 50 mcg/min (09/10/13 0901)    Past Medical History  Diagnosis Date  . 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 GERD (gastroesophageal reflux disease)   . Cancer 05/02/13    cholangiocarcinoma  . Bone cancer 05/15/13    Bone mets  . Contact lens/glasses fitting     wears contacts or glasses  . S/P radiation  therapy 06/12/13    Left femur 8 Gy single fraction  . S/P radiation therapy 05/21/2013     T6-T10 and L2-L4 received 8 Gy in a single fraction for pain contro  . Hypercalcemia 07/08/2013    Past Surgical History  Procedure Laterality Date  . Cyst removed from left breast  01/1999  . Ercp N/A 05/02/2013    Procedure: ENDOSCOPIC RETROGRADE CHOLANGIOPANCREATOGRAPHY (ERCP);  Surgeon: Arta Silence, MD;  Location: [facility];  Service: Endoscopy;  Laterality: N/A;  . Biliary stent placement N/A 05/02/2013    Procedure: BILIARY STENT PLACEMENT;  Surgeon: Arta Silence, MD;  Location: [facility];  Service: Endoscopy;  Laterality: N/A;  . Eus N/A 05/02/2013    Procedure: FULL UPPER ENDOSCOPIC ULTRASOUND (EUS) RADIAL;  Surgeon: Arta Silence, MD;  Location: [facility];  Service: Endoscopy;  Laterality: N/A;  . Breast surgery  2000    lt br cyst  . Tibia im nail insertion Left 05/28/2013    Procedure: INTRAMEDULLARY (IM) NAIL LEFT TIBIAL;  Surgeon: Hessie Dibble, MD;  Location: [facility];  Service: Orthopedics;  Laterality: Left;    Mikey College MS, [place], [place] Pager [phone] After Hours Pager

## 2013-09-10 NOTE — ED Notes (Signed)
Bed: WA06 Expected date:  Expected time:  Means of arrival:  Comments: Hold for 20

## 2013-09-10 NOTE — Consult Note (Signed)
HIDA scan c/w cholecysitis.  Placement of a perc chole tube d/w the pt, but at the present time she wishes to pursue conservative management and is more interested in pain control than undergoing another procedure.  Will consider her options and decide with her family.  Note, if chole tube is not pursued, we may remove the pt's biliary drain (potentially at the pt's bed-side) during this admission.    D/W Dr. Benay Spice who is agreement with this POC.

## 2013-09-10 NOTE — ED Notes (Signed)
Report called to floor. Waiting for 90mins until bed is put in room.

## 2013-09-10 NOTE — Progress Notes (Signed)
eLink Physician-Brief Progress Note Patient Name: Bianca Logan DOB: Nov 05, 1950 MRN: [id number]  Date of Service  09/10/2013   HPI/Events of Note   Pain  eICU Interventions  Fentanyl 25-100   Intervention Category Intermediate Interventions: Other:  Ash Mcelwain 09/10/2013, 5:56 PM

## 2013-09-10 NOTE — H&P (Signed)
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 She is s/p bile duct stent 07/15/13 and placement of external biliary drain above metal stent 1/22. Just Completed palliative XRT of C spine for bone mets. Now on every other week Xeloda. Admitted to ER at [facility] w / 1 week h/o worsening PO intake, weakness and fatigue. On arrival to ER her BP was in the 70s. Received several liters of crystalloid and although felt better BP remained in 80-90s after 4 liters. PCCM was asked to eval       SIGNIFICANT EVENTS / STUDIES:  3/3 CT chest/abd/pelvis: 1. Chronically obstructed gallbladder with new gallbladder wall thickening and pericholecystic collections, concerning for acute cholecystitis. 2. Worsening volume overload with new ascites, pulmonary edema, and larger pulmonary effusions. 3. Negative for pulmonary embolism. 4. Pelvic peritoneal inflammation or tumor spread. 5. Mild distal colitis. 6. Widespread cholangiocarcinoma, recently staged 09/03/2013.    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 She is s/p bile duct stent 07/15/13 and placement of external biliary drain above metal stent 1/22. Just Completed palliative XRT of C spine for bone mets. Now on every other week Xeloda. Admitted to ER at [facility] w / 1 week h/o worsening PO intake, weakness and fatigue. [name] an orthostatic event on 3/2 when she was being assisted up from a chair to the bathroom, and  suddenly got SOB, light headed and had transient MS change. 911 was called. On arrival to ER her BP was in the 70s. Received several liters of crystalloid and although felt better BP remained in 80-90s after 4 liters. PCCM was asked to eval      PAST MEDICAL HISTORY :  Past Medical History  Diagnosis Date  . 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 GERD (gastroesophageal reflux disease)   . Cancer 05/02/13    cholangiocarcinoma  . Bone cancer 05/15/13    Bone mets  . Contact lens/glasses fitting     wears contacts or glasses  . S/P radiation therapy 06/12/13    Left femur 8 Gy single fraction  . S/P radiation therapy 05/21/2013     T6-T10 and L2-L4 received 8 Gy in a single fraction for pain contro  . Hypercalcemia 07/08/2013   Past Surgical History  Procedure Laterality Date  . Cyst removed from left breast  01/1999  . Ercp N/A 05/02/2013    Procedure: ENDOSCOPIC RETROGRADE CHOLANGIOPANCREATOGRAPHY (ERCP);  Surgeon: Arta Silence, MD;  Location: [facility];  Service: Endoscopy;  Laterality: N/A;  . Biliary stent placement N/A 05/02/2013    Procedure: BILIARY STENT PLACEMENT;  Surgeon: Arta Silence, MD;  Location: [facility];  Service: Endoscopy;  Laterality: N/A;  . Eus N/A 05/02/2013    Procedure: FULL UPPER ENDOSCOPIC ULTRASOUND (EUS) RADIAL;  Surgeon: Arta Silence, MD;  Location: [facility];  Service: Endoscopy;  Laterality: N/A;  . Breast surgery  2000    lt br cyst  . Tibia im nail insertion Left 05/28/2013  Procedure: INTRAMEDULLARY (IM) NAIL LEFT TIBIAL;  Surgeon: Hessie Dibble, MD;  Location: [place];  Service: Orthopedics;  Laterality: Left;   Prior to Admission medications   Medication Sig Start Date End Date Taking? Authorizing Provider  Alum & Mag Hydroxide-Simeth (MAGIC MOUTHWASH W/LIDOCAINE) SOLN Take 5 mLs by mouth 3 (three) times daily as needed for mouth pain. 09/03/13  Yes Thea Silversmith, MD  enoxaparin (LOVENOX) 100 MG/ML  injection Inject 0.9 mLs (90 mg total) into the skin daily. Inject 90 mg to abdomen daily 07/16/13  Yes Ladell Pier, MD  lidocaine-prilocaine (EMLA) cream Apply 1 application topically daily as needed (for pain on port).   Yes Historical Provider, MD  morphine (MS CONTIN) 100 MG 12 hr tablet Take 1 tablet (100 mg total) by mouth every 12 (twelve) hours. 09/04/13  Yes Owens Shark, NP  morphine (MSIR) 30 MG tablet Take 1 tablet (30 mg total) by mouth every 4 (four) hours as needed for moderate pain or severe pain. 08/21/13  Yes Ladell Pier, MD  omeprazole (PRILOSEC) 20 MG capsule Take 1 capsule (20 mg total) by mouth 2 (two) times daily before a meal. 07/22/13  Yes Ladell Pier, MD  ondansetron (ZOFRAN) 4 MG tablet Take 4 mg by mouth every 6 (six) hours as needed for nausea or vomiting. 06/20/13  Yes Ladell Pier, MD  potassium chloride SA (K-DUR,KLOR-CON) 20 MEQ tablet Take 1 tablet (20 mEq total) by mouth daily. 07/22/13  Yes Ladell Pier, MD  sennosides-docusate sodium (SENOKOT-S) 8.6-50 MG tablet Take 1 tablet by mouth 2 (two) times daily.   Yes Historical Provider, MD  Sodium Chloride Flush (NORMAL SALINE FLUSH) 0.9 % SOLN 5 mLs daily.  05/03/13  Yes Historical Provider, MD  capecitabine (XELODA) 500 MG tablet Take 2 tablets (1,000 mg total) by mouth 2 (two) times daily after a meal. Take days 1-7, days 15-21=14 days/month 09/06/13   Ladell Pier, MD  polyethylene glycol [facility] / Floria Raveling) packet Take 17 g by mouth daily as needed for mild constipation or moderate constipation.     Historical Provider, MD  prochlorperazine (COMPAZINE) 10 MG tablet Take 10 mg by mouth every 6 (six) hours as needed for nausea or vomiting.    Historical Provider, MD   No Known Allergies  FAMILY HISTORY:  Family History  Problem Relation Age of Onset  . Cancer Mother     Undetermined  . Heart attack Father   . Cancer Father     prostate  . Anuerysm Sister   . Obesity Brother    SOCIAL  HISTORY:  reports that she quit smoking about 28 years ago. Her smoking use included Cigarettes. She started smoking about 29 years ago. She has a 18 pack-year smoking history. She has never used smokeless tobacco. She reports that she drinks about 2.4 ounces of alcohol per week. She reports that she does not use illicit drugs.  ROS Patient unable to provide due to amount of pain.   SUBJECTIVE:    VITAL SIGNS: Temp:  [98.3 F (36.8 C)-100 F (37.8 C)] 100 F (37.8 C) (03/03 0000) Pulse Rate:  [109-133] 132 (03/02 2353) Resp:  [16-24] 21 (03/03 0200) BP: (76-99)/(42-62) 86/62 mmHg (03/03 0200) SpO2:  [86 %-100 %] 97 % (03/02 2353) FiO2 (%):  [2 %] 2 % (03/02 2257) Weight:  [46.72 kg (103 lb)] 46.72 kg (103 lb) (03/03 0026) HEMODYNAMICS:   VENTILATOR SETTINGS: Vent Mode:  [-]  FiO2 (%):  [  2 %] 2 % INTAKE / OUTPUT: Intake/Output     03/02 0701 - 03/03 0700   Urine (mL/kg/hr) 300   Total Output 300   Net -300         PHYSICAL EXAMINATION: General:  Chronically ill appearing white female, currently in acute distress.  Neuro:  Awake, non-focal, no def  HEENT:  [place]. No JVD MM dry  Cardiovascular:  Tachy rrr  Lungs:  Decreased  Abdomen:  Perc drain w/ dressing intact. Abd soft  Musculoskeletal:  Intact  Skin:  Sacral decub dressed   LABS:  CBC  Recent Labs Lab 09/03/13 1208 09/10/2013 2257  WBC 22.7* 10.4  HGB 12.3 11.7*  HCT 37.5 33.6*  PLT 192 120*   Coag's No results found for this basename: APTT, INR,  in the last 168 hours BMET  Recent Labs Lab 09/03/13 1208 10/01/2013 2209  NA 124* 121*  K 4.5 4.7  CL  --  86*  CO2 21* 18*  BUN 10.5 16  CREATININE 0.5* 0.53  GLUCOSE 108 80   Electrolytes  Recent Labs Lab 09/03/13 1208 09/30/2013 2209  CALCIUM 8.8 8.2*   Sepsis Markers  Recent Labs Lab 09/12/2013 2209 09/10/13 0347  LATICACIDVEN 3.6* 2.01   ABG No results found for this basename: PHART, PCO2ART, PO2ART,  in the last 168 hours Liver  Enzymes  Recent Labs Lab 09/03/13 1208 09/15/2013 2209  AST 32 35  ALT 39 39*  ALKPHOS 607* 578*  BILITOT 0.48 0.6  ALBUMIN 2.3* 1.8*   Cardiac Enzymes  Recent Labs Lab 09/19/2013 2209  TROPONINI <0.30   Glucose No results found for this basename: GLUCAP,  in the last 168 hours  Imaging Ct Angio Chest W/cm &/or Wo Cm  09/10/2013   CLINICAL DATA:  Weakness.  Shortness of breath.  Cancer.  EXAM: CT ANGIOGRAPHY CHEST  CT ABDOMEN AND PELVIS WITH CONTRAST  TECHNIQUE: Multidetector CT imaging of the chest was performed using the standard protocol during bolus administration of intravenous contrast. Multiplanar CT image reconstructions and MIPs were obtained to evaluate the vascular anatomy. Multidetector CT imaging of the abdomen and pelvis was performed using the standard protocol during bolus administration of intravenous contrast.  CONTRAST:  85mL OMNIPAQUE IOHEXOL 300 MG/ML SOLN, 4mL OMNIPAQUE IOHEXOL 350 MG/ML SOLN  COMPARISON:  None.  FINDINGS: CTA CHEST FINDINGS  THORACIC INLET/BODY WALL:  Right IJ porta catheter, tip at the level of the superior cavoatrial junction. No acute abnormality.  MEDIASTINUM:  No cardiomegaly. Small volume, simple appearing pericardial fluid. No evidence of pulmonary arterial filling defect. No acute systemic arterial abnormality. No lymphadenopathy.  LUNG WINDOWS:  Small to moderate right pleural effusion, water density and layering. There is a small left pleural effusion. There is right more than left dependent atelectasis. Mild pulmonary edema, best seen in the apical and basilar regions where there is interlobular septal thickening.  OSSEOUS:  Diffuse axial and proximal appendicular skeletal metastases with remote pathologic fractures of the manubrium and T9 body. The recently characterize metastatic disease shows no interval progression. No acute fracture identified.  CT ABDOMEN and PELVIS FINDINGS  ABDOMEN/PELVIS:  Liver: Numerous ill-defined, delayed or  hypoenhancing masses are present throughout the liver, distribution and size essentially stable from prior when accounting for differences in contrast timing. There is no new abnormality suggestive of a abscess. The larger lesions measure approximately 2.5-3 cm, located in segment 4 A, segment 8, and subcapsular segment 6.  Biliary: Left approach biliary drain, with pigtail fully formed  in the hepatic hilum. There is diffuse pneumobilia. A metallic stent in the distal CBD continues to extend to the mid duodenum. Diffuse dilation of the biliary tree is stable from prior. Chronic marked distention of the gallbladder with multiple stones and diffuse wall thickening. The wall thickening has increased from prior, and there is pericholecystic fluid accumulation.  Pancreas: Chronic main duct prominence, possibly related to the stent.  Spleen: Unremarkable.  Adrenals: Unremarkable.  Kidneys and ureters: Symmetric enhancement.  No urinary obstruction.  Bladder: Unremarkable.  Reproductive: Unremarkable.  Bowel: Mild circumferential thickening of the descending and sigmoid colon. No bowel obstruction. Normal appendix.  Retroperitoneum: Diffuse retroperitoneal edema which is increased from prior.  Peritoneum: New small volume ascites. There is new, relatively smooth peritoneal thickening in the posterior pelvic recesses. Calcified loose body in the low pelvis.  Vascular: Narrowing of the infrarenal IVC secondary to displaced lumbar spine fracture.  OSSEOUS: Diffuse osseous metastatic disease. There are remote pathologic fractures of the L1 and L3 bodies. The L3 fracture is displaced, with large ventral fracture fragment deforming the aorta and infrarenal IVC. No acute fracture suspected.  Review of the MIP images confirms the above findings.  IMPRESSION: 1. Chronically obstructed gallbladder with new gallbladder wall thickening and pericholecystic collections, concerning for acute cholecystitis. 2. Worsening volume overload  with new ascites, pulmonary edema, and larger pulmonary effusions. 3. Negative for pulmonary embolism. 4. Pelvic peritoneal inflammation or tumor spread. 5. Mild distal colitis. 6. Widespread cholangiocarcinoma, recently staged 09/03/2013.   Electronically Signed   By: Jorje Guild M.D.   On: 09/10/2013 03:29   Dg Chest Port 1 View  09/18/2013   CLINICAL DATA:  Weakness.  Hypoxia.  Cholangiocarcinoma.  EXAM: PORTABLE CHEST - 1 VIEW  COMPARISON:  09/03/2013 CT.  06/27/2013 chest x-ray  FINDINGS: Small right-sided pleural effusion and right base atelectasis suspected.  Right central line tip distal superior vena cava level.  Sclerotic bony expansion left second rib unchanged. Question metastatic lesion versus result of prior trauma.  Heart size within normal limits.  No pneumothorax.  IMPRESSION: Small right-sided pleural effusion and right base atelectasis suspected.  Right central line tip distal superior vena cava level.  Sclerotic bony expansion left second rib unchanged. Question metastatic lesion versus result of prior trauma.   Electronically Signed   By: Chauncey Cruel M.D.   On: 10/06/2013 22:05     CXR:   ASSESSMENT / PLAN:  PULMONARY A: Right > left pleural effusion  No sig dyspnea  P:   Wean FIO2 Supplemental O2 as needed.  Could consider sampling pleural fluid at some point in future if a clear indication to do so. 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 She is s/p bile duct stent 07/15/13 and placement of external biliary drain above metal  stent 1/22 P:   Clear liquids   Appreciate IR's assistance. Nothing clinically to suggest acute chole or malfunctioning biliary drain   HEMATOLOGIC A:   Mild anemia  H/o DVT-->On LMWH P:  Cont LMWH  Trend cbc   INFECTIOUS A:  Severe sepsis/septic shock.  >consider cholecystitis superimposed on chronically obstructed GB from her Cholangiocarcinoma, but her exam is benign and CT scan is stable. Certainly at risk for bacterial translocation  P:   Pan culture Empiric abx > vanco + pip/tazo IR planning to eval biliary drain  ENDOCRINE A:  No acute  P:   Trend cbg Ck cortisol > pending  NEUROLOGIC A:  Chronic cancer pain.  She is in terrible pain. All chronic. This includes her neck, arm spine and sacrum P:   Supportive care  Will escalate PRN dilaudid and also continue her long acting morphine   TODAY'S SUMMARY:  Admitted w/ essentially FTT and severe hypotension. Not sure if this is sepsis or if it's dehydration. Likely a mix of both. Will admit her to ICU. Have had good talk w/ patient and husband at bedside. We will be aggressive w/ IVFs and pressors. But she does request No CPR, ventilator or defibrillation. She will need IR and Onc consult while here.   I have personally obtained a history, examined the patient, evaluated laboratory and imaging results, formulated the assessment and plan and placed orders.  CRITICAL CARE: The patient is critically ill with multiple organ systems failure and requires high complexity decision making for assessment and support, frequent evaluation and titration of therapies, application of advanced monitoring technologies and extensive interpretation of multiple databases. Critical Care Time devoted to patient care services described in this note is 60 minutes.    Baltazar Apo, MD, PhD 09/10/2013, 11:22 AM [facility] [phone] or if no answer [phone]

## 2013-09-10 NOTE — Progress Notes (Signed)
CARE MANAGEMENT NOTE 09/10/2013  Patient:  Bianca Logan, Bianca Logan   Account Number:  [id number]  Date Initiated:  09/10/2013  Documentation initiated by:  DAVIS,RHONDA  Subjective/Objective Assessment:   stage IV w/ mets to liver, abd/retroperitoneal adenopathy, and bone (spine and rib).     Action/Plan:   presents with shock picture/ plan is to return to home. May require hospice and palliative care referrals.   Anticipated DC Date:  09/13/2013   Anticipated DC Plan:  HOME/SELF CARE  In-house referral  NA      DC Planning Services  NA      [facility] Choice  NA   Choice offered to / List presented to:  NA   DME arranged  NA      DME agency  NA  NA     [place] arranged  NA      [place] agency  NA   Status of service:  In process, will continue to follow Medicare Important Message given?  NA - LOS <3 / Initial given by admissions (If response is "NO", the following Medicare IM given date fields will be blank) Date Medicare IM given:   Date Additional Medicare IM given:    Discharge Disposition:    Per UR Regulation:  Reviewed for med. necessity/level of care/duration of stay  If discussed at [place] of Stay Meetings, dates discussed:    Comments:  [id number]/Rhonda Eldridge Dace, BSN, [place] [phone] Chart Reviewed for discharge and hospital needs. Discharge needs at time of review:  None present will follow for needs. Review of patient progress due on [id number].

## 2013-09-10 NOTE — ED Notes (Signed)
Bianca Logan and I was in the room when we got urine.

## 2013-09-10 NOTE — Progress Notes (Signed)
ANTIBIOTIC CONSULT NOTE - INITIAL  Pharmacy Consult for Zosyn/Vancomycin Indication: rule out sepsis  No Known Allergies  Patient Measurements: Weight: 103 lb (46.72 kg)  Vital Signs: Temp: 100 F (37.8 C) (03/03 0000) Temp src: Rectal (03/03 0000) BP: 97/63 mmHg (03/03 0500) Pulse Rate: 132 (03/02 2353) Intake/Output from previous day: 03/02 0701 - 03/03 0700 In: -  Out: 300 [Urine:300] Intake/Output from this shift: Total I/O In: -  Out: 300 [Urine:300]  Labs:  Recent Labs  10/04/2013 2209 09/21/2013 2257  WBC  --  10.4  HGB  --  11.7*  PLT  --  120*  CREATININE 0.53  --    The CrCl is unknown because both a height and weight (above a minimum accepted value) are required for this calculation. No results found for this basename: Letta Median, VANCORANDOM, [place], [place], [place], [place], TOBRAPEAK, TOBRARND, AMIKACINPEAK, AMIKACINTROU, AMIKACIN,  in the last 72 hours   Microbiology: Recent Results (from the past 720 hour(s))  TECHNOLOGIST REVIEW     Status: None   Collection Time    08/19/13  9:56 AM      Result Value Ref Range Status   Technologist Review Metas and Myelocytes present   Final  TECHNOLOGIST REVIEW     Status: None   Collection Time    09/03/13 12:08 PM      Result Value Ref Range Status   Technologist Review     Final   Value: Metas and Myelocytes present, mod toxic granulation    Medical History: Past Medical History  Diagnosis Date  . 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 GERD (gastroesophageal reflux disease)   . Cancer 05/02/13    cholangiocarcinoma  . Bone cancer 05/15/13    Bone mets  . Contact lens/glasses fitting     wears contacts or glasses  . S/P radiation therapy 06/12/13    Left femur 8 Gy single fraction  . S/P radiation therapy 05/21/2013     T6-T10 and L2-L4 received 8 Gy in a single fraction for pain contro  . Hypercalcemia 07/08/2013    Medications:  Scheduled:  . enoxaparin  90 mg  Subcutaneous Q24H  . morphine  100 mg Oral Q12H   Infusions:  . sodium chloride    . sodium chloride    . phenylephrine (NEO-SYNEPHRINE) Adult infusion    . piperacillin-tazobactam (ZOSYN)  IV    . sodium chloride 1,000 mL (09/10/13 0621)  . sodium chloride    . vancomycin     Assessment: 63 yo F with stage IV cholangiocarcinoma, hx DVT on lovenox admitted c/o weakness, fatigue and decreased po intake.  Zosyn and Vancomycin for r/o Sepsis.  Goal of Therapy:  Vancomycin trough level 15-20 mcg/ml  Plan:   Zosyn 3.375 Gm IV q8h EI infusion  Vancomycin 750mg  x1 then 500mg  IV q12h  F/U SCr/levels/cultures as needed.  Dorrene German 09/10/2013,6:26 AM

## 2013-09-10 NOTE — Progress Notes (Signed)
[facility]  Patient Status: Active (receiving services up to time of hospitalization)  [facility] is providing the following services: RN  If patient discharges after hours, please call [phone].   Bianca Logan 09/10/2013, 2:28 PM

## 2013-09-10 NOTE — ED Provider Notes (Signed)
5:01 AM Case discussed w/ [facility] who states pt is note a surgical candidate for cholecystectomy, possible IR procedure for drainage. Discussed w/ CC who recommends 2 more L of IVF and call them back.   CC came and evaluated the pt. They will admit.   Clinical Impression 1. Hypotension   2. Dehydration   3. Nausea   4. Stage IV cholangiocarcinoma   5. Hyponatremia   6. Sepsis      Blanchard Kelch, MD 09/10/13 2007

## 2013-09-10 NOTE — Consult Note (Signed)
HPI: Bianca Logan is an 63 y.o. female with complicated history including metastatic cholangiocarcinoma. She has had previous biliary drains placed with subsequent stent placement. Most recently, her stent was noted to be patent and int/ext biliary drain was exchanged for external drain only, placed above the stent, but left capped. She denies any issues with her drain since that procedure. She had a follow up scan on 2/24 showing progression of hepatic mets but no evidence of biliary dilatation with patent bilary stent. GB dilatation, wall thickening, and stone seen at that time. She has been undergoing palliative radiation for the past 12 days and yesterday, she developed significant weakness and lethargy and dyspnea. She has had some generalized pain, but denies specific abd pain. She has been eating fairly well also and denies post prandial N/V. She presented to the ER with a 'shock' type picture. A new CT scan shows evidence of worsening volume overload with new ascites and pulmonary edema. GB wall thickening and pericholecystic fluid collections present, but essentially unchanged from prior scans. She has additional findings of metastatic spread, with known disease in her spine, ribs, and retroperitoneum.  Pt has been admitted. IR is asked to review case/imaging for possibility of acute cholecystitis that may need percutaneous drainage.  Past Medical History:  Past Medical History  Diagnosis Date  . 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 GERD (gastroesophageal reflux disease)   . Cancer 05/02/13    cholangiocarcinoma  . Bone cancer 05/15/13    Bone mets  . Contact lens/glasses fitting     wears contacts or glasses  . S/P radiation therapy 06/12/13    Left femur 8 Gy single fraction  . S/P radiation therapy 05/21/2013     T6-T10 and L2-L4 received 8 Gy in a single fraction for pain contro  . Hypercalcemia 07/08/2013    Past Surgical History:  Past Surgical History  Procedure  Laterality Date  . Cyst removed from left breast  01/1999  . Ercp N/A 05/02/2013    Procedure: ENDOSCOPIC RETROGRADE CHOLANGIOPANCREATOGRAPHY (ERCP);  Surgeon: Arta Silence, MD;  Location: [facility];  Service: Endoscopy;  Laterality: N/A;  . Biliary stent placement N/A 05/02/2013    Procedure: BILIARY STENT PLACEMENT;  Surgeon: Arta Silence, MD;  Location: [facility];  Service: Endoscopy;  Laterality: N/A;  . Eus N/A 05/02/2013    Procedure: FULL UPPER ENDOSCOPIC ULTRASOUND (EUS) RADIAL;  Surgeon: Arta Silence, MD;  Location: [facility];  Service: Endoscopy;  Laterality: N/A;  . Breast surgery  2000    lt br cyst  . Tibia im nail insertion Left 05/28/2013    Procedure: INTRAMEDULLARY (IM) NAIL LEFT TIBIAL;  Surgeon: Hessie Dibble, MD;  Location: [facility];  Service: Orthopedics;  Laterality: Left;    Family History:  Family History  Problem Relation Age of Onset  . Cancer Mother     Undetermined  . Heart attack Father   . Cancer Father     prostate  . Anuerysm Sister   . Obesity Brother     Social History:  reports that she quit smoking about 28 years ago. Her smoking use included Cigarettes. She started smoking about 29 years ago. She has a 18 pack-year smoking history. She has never used smokeless tobacco. She reports that she drinks about 2.4 ounces of alcohol per week. She reports that she does not use illicit drugs.  Allergies: No Known Allergies  Medications:   Medication List    ASK your doctor about these medications  capecitabine 500 MG tablet  Commonly known as:  XELODA  Take 2 tablets (1,000 mg total) by mouth 2 (two) times daily after a meal. Take days 1-7, days 15-21=14 days/month     enoxaparin 100 MG/ML injection  Commonly known as:  LOVENOX  Inject 0.9 mLs (90 mg total) into the skin daily. Inject 90 mg to abdomen daily     lidocaine-prilocaine cream  Commonly known as:  EMLA  Apply 1 application topically daily as  needed (for pain on port).     magic mouthwash w/lidocaine Soln  Take 5 mLs by mouth 3 (three) times daily as needed for mouth pain.     morphine 30 MG tablet  Commonly known as:  MSIR  Take 1 tablet (30 mg total) by mouth every 4 (four) hours as needed for moderate pain or severe pain.     morphine 100 MG 12 hr tablet  Commonly known as:  MS CONTIN  Take 1 tablet (100 mg total) by mouth every 12 (twelve) hours.     Normal Saline Flush 0.9 % Soln  5 mLs daily.     omeprazole 20 MG capsule  Commonly known as:  PRILOSEC  Take 1 capsule (20 mg total) by mouth 2 (two) times daily before a meal.     ondansetron 4 MG tablet  Commonly known as:  ZOFRAN  Take 4 mg by mouth every 6 (six) hours as needed for nausea or vomiting.     polyethylene glycol packet  Commonly known as:  MIRALAX / GLYCOLAX  Take 17 g by mouth daily as needed for mild constipation or moderate constipation.     potassium chloride SA 20 MEQ tablet  Commonly known as:  K-DUR,KLOR-CON  Take 1 tablet (20 mEq total) by mouth daily.     prochlorperazine 10 MG tablet  Commonly known as:  COMPAZINE  Take 10 mg by mouth every 6 (six) hours as needed for nausea or vomiting.     sennosides-docusate sodium 8.6-50 MG tablet  Commonly known as:  SENOKOT-S  Take 1 tablet by mouth 2 (two) times daily.        Please HPI for pertinent positives, otherwise complete 10 system ROS negative.  Physical Exam: BP 101/64  Pulse 121  Temp(Src) 98.5 F (36.9 C) (Oral)  Resp 26  Ht _0  (1.6 m)  Wt 119 lb 7.8 oz (54.2 kg)  BMI 21.17 kg/m2  SpO2 97% Body mass index is 21.17 kg/(m^2).   General Appearance:  Awake and conversant but very weak. Nontoxic appearing.  Head:  Normocephalic, without obvious abnormality, atraumatic  ENT: Unremarkable  Neck: Supple, symmetrical, trachea midline  Lungs:   Diminished breath sounds bilat  Chest Wall:  No tenderness or deformity  Heart:  Tachy, no murmurs  Abdomen:   Soft,  non-tender, non distended. Drain in mid abd is her external biliary drain, capped, no signs of leakage  Neurologic: Normal affect, no gross deficits.   Results for orders placed during the hospital encounter of 09/22/2013 (from the past 48 hour(s))  COMPREHENSIVE METABOLIC PANEL     Status: Abnormal   Collection Time    10/02/2013 10:09 PM      Result Value Ref Range   Sodium 121 (*) 137 - 147 mEq/L   Comment: CRITICAL RESULT CALLED TO, READ BACK BY AND VERIFIED WITH:     LVARNER RN AT 2300 ON [id number] BY DLONG   Potassium 4.7  3.7 - 5.3 mEq/L   Chloride 86 (*)  96 - 112 mEq/L   CO2 18 (*) 19 - 32 mEq/L   Glucose, Bld 80  70 - 99 mg/dL   BUN 16  6 - 23 mg/dL   Creatinine, Ser 0.53  0.50 - 1.10 mg/dL   Calcium 8.2 (*) 8.4 - 10.5 mg/dL   Total Protein 5.5 (*) 6.0 - 8.3 g/dL   Albumin 1.8 (*) 3.5 - 5.2 g/dL   AST 35  0 - 37 U/L   ALT 39 (*) 0 - 35 U/L   Alkaline Phosphatase 578 (*) 39 - 117 U/L   Total Bilirubin 0.6  0.3 - 1.2 mg/dL   GFR calc non Af Amer >90  >90 mL/min   GFR calc Af Amer >90  >90 mL/min   Comment: (NOTE)     The eGFR has been calculated using the CKD EPI equation.     This calculation has not been validated in all clinical situations.     eGFR's persistently <90 mL/min signify possible Chronic Kidney     Disease.  LACTIC ACID, PLASMA     Status: Abnormal   Collection Time    10/03/2013 10:09 PM      Result Value Ref Range   Lactic Acid, Venous 3.6 (*) 0.5 - 2.2 mmol/L  TROPONIN I     Status: None   Collection Time    09/08/2013 10:09 PM      Result Value Ref Range   Troponin I <0.30  <0.30 ng/mL   Comment:            Due to the release kinetics of cTnI,     a negative result within the first hours     of the onset of symptoms does not rule out     myocardial infarction with certainty.     If myocardial infarction is still suspected,     repeat the test at appropriate intervals.  POC OCCULT BLOOD, ED     Status: None   Collection Time    09/11/2013 10:22 PM       Result Value Ref Range   Fecal Occult Bld NEGATIVE  NEGATIVE  CBC WITH DIFFERENTIAL     Status: Abnormal   Collection Time    09/15/2013 10:57 PM      Result Value Ref Range   WBC 10.4  4.0 - 10.5 K/uL   RBC 4.31  3.87 - 5.11 MIL/uL   Hemoglobin 11.7 (*) 12.0 - 15.0 g/dL   HCT 33.6 (*) 36.0 - 46.0 %   MCV 78.0  78.0 - 100.0 fL   MCH 27.1  26.0 - 34.0 pg   MCHC 34.8  30.0 - 36.0 g/dL   RDW 16.0 (*) 11.5 - 15.5 %   Platelets 120 (*) 150 - 400 K/uL   Comment: SPECIMEN CHECKED FOR CLOTS     REPEATED TO VERIFY   Neutrophils Relative % 84 (*) 43 - 77 %   Lymphocytes Relative 10 (*) 12 - 46 %   Monocytes Relative 5  3 - 12 %   Eosinophils Relative 1  0 - 5 %   Basophils Relative 0  0 - 1 %   Neutro Abs 8.8 (*) 1.7 - 7.7 K/uL   Lymphs Abs 1.0  0.7 - 4.0 K/uL   Monocytes Absolute 0.5  0.1 - 1.0 K/uL   Eosinophils Absolute 0.1  0.0 - 0.7 K/uL   Basophils Absolute 0.0  0.0 - 0.1 K/uL   RBC Morphology TEARDROP CELLS  Comment: ELLIPTOCYTES     POLYCHROMASIA PRESENT   WBC Morphology TOXIC GRANULATION     Comment: MILD LEFT SHIFT (1-5% METAS, OCC MYELO, OCC BANDS)   Smear Review PLATELET COUNT CONFIRMED BY SMEAR    URINALYSIS, ROUTINE W REFLEX MICROSCOPIC     Status: Abnormal   Collection Time    09/10/13 12:23 AM      Result Value Ref Range   Color, Urine YELLOW  YELLOW   APPearance CLOUDY (*) CLEAR   Specific Gravity, Urine 1.012  1.005 - 1.030   pH 6.0  5.0 - 8.0   Glucose, UA NEGATIVE  NEGATIVE mg/dL   Hgb urine dipstick NEGATIVE  NEGATIVE   Bilirubin Urine NEGATIVE  NEGATIVE   Ketones, ur NEGATIVE  NEGATIVE mg/dL   Protein, ur NEGATIVE  NEGATIVE mg/dL   Urobilinogen, UA 1.0  0.0 - 1.0 mg/dL   Nitrite NEGATIVE  NEGATIVE   Leukocytes, UA NEGATIVE  NEGATIVE   Comment: MICROSCOPIC NOT DONE ON URINES WITH NEGATIVE PROTEIN, BLOOD, LEUKOCYTES, NITRITE, OR GLUCOSE <1000 mg/dL.  I-STAT CG4 LACTIC ACID, ED     Status: None   Collection Time    09/10/13  3:47 AM      Result Value Ref  Range   Lactic Acid, Venous 2.01  0.5 - 2.2 mmol/L  LIPASE, BLOOD     Status: Abnormal   Collection Time    09/10/13  6:35 AM      Result Value Ref Range   Lipase 115 (*) 11 - 59 U/L  AMYLASE     Status: Abnormal   Collection Time    09/10/13  6:35 AM      Result Value Ref Range   Amylase 149 (*) 0 - 105 U/L  PHOSPHORUS     Status: None   Collection Time    09/10/13  6:35 AM      Result Value Ref Range   Phosphorus 3.6  2.3 - 4.6 mg/dL  MAGNESIUM     Status: None   Collection Time    09/10/13  6:35 AM      Result Value Ref Range   Magnesium 1.6  1.5 - 2.5 mg/dL  TROPONIN I     Status: None   Collection Time    09/10/13  6:35 AM      Result Value Ref Range   Troponin I <0.30  <0.30 ng/mL   Comment:            Due to the release kinetics of cTnI,     a negative result within the first hours     of the onset of symptoms does not rule out     myocardial infarction with certainty.     If myocardial infarction is still suspected,     repeat the test at appropriate intervals.  LACTIC ACID, PLASMA     Status: None   Collection Time    09/10/13  6:35 AM      Result Value Ref Range   Lactic Acid, Venous 1.7  0.5 - 2.2 mmol/L  PROCALCITONIN     Status: None   Collection Time    09/10/13  6:35 AM      Result Value Ref Range   Procalcitonin 3.65     Comment:            Interpretation:     PCT > 2 ng/mL:     Systemic infection (sepsis) is likely,     unless other causes are known.     (  NOTE)             ICU PCT Algorithm               Non ICU PCT Algorithm        ----------------------------     ------------------------------             PCT < 0.25 ng/mL                 PCT < 0.1 ng/mL         Stopping of antibiotics            Stopping of antibiotics           strongly encouraged.               strongly encouraged.        ----------------------------     ------------------------------           PCT level decrease by               PCT < 0.25 ng/mL           >= 80% from peak  PCT           OR PCT 0.25 - 0.5 ng/mL          Stopping of antibiotics                                                 encouraged.         Stopping of antibiotics               encouraged.        ----------------------------     ------------------------------           PCT level decrease by              PCT >= 0.25 ng/mL           < 80% from peak PCT            AND PCT >= 0.5 ng/mL            Continuing antibiotics                                                  encouraged.           Continuing antibiotics                encouraged.        ----------------------------     ------------------------------         PCT level increase compared          PCT > 0.5 ng/mL             with peak PCT AND              PCT >= 0.5 ng/mL             Escalation of antibiotics                                              strongly encouraged.  Escalation of antibiotics            strongly encouraged.  BASIC METABOLIC PANEL     Status: Abnormal   Collection Time    09/10/13  6:35 AM      Result Value Ref Range   Sodium 126 (*) 137 - 147 mEq/L   Potassium 4.2  3.7 - 5.3 mEq/L   Chloride 96  96 - 112 mEq/L   Comment: DELTA CHECK NOTED     REPEATED TO VERIFY   CO2 16 (*) 19 - 32 mEq/L   Glucose, Bld 65 (*) 70 - 99 mg/dL   BUN 12  6 - 23 mg/dL   Creatinine, Ser 0.42 (*) 0.50 - 1.10 mg/dL   Calcium 7.6 (*) 8.4 - 10.5 mg/dL   GFR calc non Af Amer >90  >90 mL/min   GFR calc Af Amer >90  >90 mL/min   Comment: (NOTE)     The eGFR has been calculated using the CKD EPI equation.     This calculation has not been validated in all clinical situations.     eGFR's persistently <90 mL/min signify possible Chronic Kidney     Disease.  MRSA PCR SCREENING     Status: None   Collection Time    09/10/13  8:19 AM      Result Value Ref Range   MRSA by PCR NEGATIVE  NEGATIVE   Comment:            The GeneXpert MRSA Assay (FDA     approved for NASAL specimens     only), is one component of a      comprehensive MRSA colonization     surveillance program. It is not     intended to diagnose MRSA     infection nor to guide or     monitor treatment for     MRSA infections.   Ct Angio Chest W/cm &/or Wo Cm  09/10/2013   CLINICAL DATA:  Weakness.  Shortness of breath.  Cancer.  EXAM: CT ANGIOGRAPHY CHEST  CT ABDOMEN AND PELVIS WITH CONTRAST  TECHNIQUE: Multidetector CT imaging of the chest was performed using the standard protocol during bolus administration of intravenous contrast. Multiplanar CT image reconstructions and MIPs were obtained to evaluate the vascular anatomy. Multidetector CT imaging of the abdomen and pelvis was performed using the standard protocol during bolus administration of intravenous contrast.  CONTRAST:  16m OMNIPAQUE IOHEXOL 300 MG/ML SOLN, 861mOMNIPAQUE IOHEXOL 350 MG/ML SOLN  COMPARISON:  None.  FINDINGS: CTA CHEST FINDINGS  THORACIC INLET/BODY WALL:  Right IJ porta catheter, tip at the level of the superior cavoatrial junction. No acute abnormality.  MEDIASTINUM:  No cardiomegaly. Small volume, simple appearing pericardial fluid. No evidence of pulmonary arterial filling defect. No acute systemic arterial abnormality. No lymphadenopathy.  LUNG WINDOWS:  Small to moderate right pleural effusion, water density and layering. There is a small left pleural effusion. There is right more than left dependent atelectasis. Mild pulmonary edema, best seen in the apical and basilar regions where there is interlobular septal thickening.  OSSEOUS:  Diffuse axial and proximal appendicular skeletal metastases with remote pathologic fractures of the manubrium and T9 body. The recently characterize metastatic disease shows no interval progression. No acute fracture identified.  CT ABDOMEN and PELVIS FINDINGS  ABDOMEN/PELVIS:  Liver: Numerous ill-defined, delayed or hypoenhancing masses are present throughout the liver, distribution and size essentially stable from prior when accounting for  differences in contrast timing. There is  no new abnormality suggestive of a abscess. The larger lesions measure approximately 2.5-3 cm, located in segment 4 A, segment 8, and subcapsular segment 6.  Biliary: Left approach biliary drain, with pigtail fully formed in the hepatic hilum. There is diffuse pneumobilia. A metallic stent in the distal CBD continues to extend to the mid duodenum. Diffuse dilation of the biliary tree is stable from prior. Chronic marked distention of the gallbladder with multiple stones and diffuse wall thickening. The wall thickening has increased from prior, and there is pericholecystic fluid accumulation.  Pancreas: Chronic main duct prominence, possibly related to the stent.  Spleen: Unremarkable.  Adrenals: Unremarkable.  Kidneys and ureters: Symmetric enhancement.  No urinary obstruction.  Bladder: Unremarkable.  Reproductive: Unremarkable.  Bowel: Mild circumferential thickening of the descending and sigmoid colon. No bowel obstruction. Normal appendix.  Retroperitoneum: Diffuse retroperitoneal edema which is increased from prior.  Peritoneum: New small volume ascites. There is new, relatively smooth peritoneal thickening in the posterior pelvic recesses. Calcified loose body in the low pelvis.  Vascular: Narrowing of the infrarenal IVC secondary to displaced lumbar spine fracture.  OSSEOUS: Diffuse osseous metastatic disease. There are remote pathologic fractures of the L1 and L3 bodies. The L3 fracture is displaced, with large ventral fracture fragment deforming the aorta and infrarenal IVC. No acute fracture suspected.  Review of the MIP images confirms the above findings.  IMPRESSION: 1. Chronically obstructed gallbladder with new gallbladder wall thickening and pericholecystic collections, concerning for acute cholecystitis. 2. Worsening volume overload with new ascites, pulmonary edema, and larger pulmonary effusions. 3. Negative for pulmonary embolism. 4. Pelvic peritoneal  inflammation or tumor spread. 5. Mild distal colitis. 6. Widespread cholangiocarcinoma, recently staged 09/03/2013.   Electronically Signed   By: Jorje Guild M.D.   On: 09/10/2013 03:29   Ct Abdomen Pelvis W Contrast  09/10/2013   CLINICAL DATA:  Weakness.  Shortness of breath.  Cancer.  EXAM: CT ANGIOGRAPHY CHEST  CT ABDOMEN AND PELVIS WITH CONTRAST  TECHNIQUE: Multidetector CT imaging of the chest was performed using the standard protocol during bolus administration of intravenous contrast. Multiplanar CT image reconstructions and MIPs were obtained to evaluate the vascular anatomy. Multidetector CT imaging of the abdomen and pelvis was performed using the standard protocol during bolus administration of intravenous contrast.  CONTRAST:  43m OMNIPAQUE IOHEXOL 300 MG/ML SOLN, 834mOMNIPAQUE IOHEXOL 350 MG/ML SOLN  COMPARISON:  None.  FINDINGS: CTA CHEST FINDINGS  THORACIC INLET/BODY WALL:  Right IJ porta catheter, tip at the level of the superior cavoatrial junction. No acute abnormality.  MEDIASTINUM:  No cardiomegaly. Small volume, simple appearing pericardial fluid. No evidence of pulmonary arterial filling defect. No acute systemic arterial abnormality. No lymphadenopathy.  LUNG WINDOWS:  Small to moderate right pleural effusion, water density and layering. There is a small left pleural effusion. There is right more than left dependent atelectasis. Mild pulmonary edema, best seen in the apical and basilar regions where there is interlobular septal thickening.  OSSEOUS:  Diffuse axial and proximal appendicular skeletal metastases with remote pathologic fractures of the manubrium and T9 body. The recently characterize metastatic disease shows no interval progression. No acute fracture identified.  CT ABDOMEN and PELVIS FINDINGS  ABDOMEN/PELVIS:  Liver: Numerous ill-defined, delayed or hypoenhancing masses are present throughout the liver, distribution and size essentially stable from prior when accounting  for differences in contrast timing. There is no new abnormality suggestive of a abscess. The larger lesions measure approximately 2.5-3 cm, located in segment 4 A, segment  8, and subcapsular segment 6.  Biliary: Left approach biliary drain, with pigtail fully formed in the hepatic hilum. There is diffuse pneumobilia. A metallic stent in the distal CBD continues to extend to the mid duodenum. Diffuse dilation of the biliary tree is stable from prior. Chronic marked distention of the gallbladder with multiple stones and diffuse wall thickening. The wall thickening has increased from prior, and there is pericholecystic fluid accumulation.  Pancreas: Chronic main duct prominence, possibly related to the stent.  Spleen: Unremarkable.  Adrenals: Unremarkable.  Kidneys and ureters: Symmetric enhancement.  No urinary obstruction.  Bladder: Unremarkable.  Reproductive: Unremarkable.  Bowel: Mild circumferential thickening of the descending and sigmoid colon. No bowel obstruction. Normal appendix.  Retroperitoneum: Diffuse retroperitoneal edema which is increased from prior.  Peritoneum: New small volume ascites. There is new, relatively smooth peritoneal thickening in the posterior pelvic recesses. Calcified loose body in the low pelvis.  Vascular: Narrowing of the infrarenal IVC secondary to displaced lumbar spine fracture.  OSSEOUS: Diffuse osseous metastatic disease. There are remote pathologic fractures of the L1 and L3 bodies. The L3 fracture is displaced, with large ventral fracture fragment deforming the aorta and infrarenal IVC. No acute fracture suspected.  Review of the MIP images confirms the above findings.  IMPRESSION: 1. Chronically obstructed gallbladder with new gallbladder wall thickening and pericholecystic collections, concerning for acute cholecystitis. 2. Worsening volume overload with new ascites, pulmonary edema, and larger pulmonary effusions. 3. Negative for pulmonary embolism. 4. Pelvic peritoneal  inflammation or tumor spread. 5. Mild distal colitis. 6. Widespread cholangiocarcinoma, recently staged 09/03/2013.   Electronically Signed   By: Jorje Guild M.D.   On: 09/10/2013 03:29   Dg Chest Port 1 View  10/01/2013   CLINICAL DATA:  Weakness.  Hypoxia.  Cholangiocarcinoma.  EXAM: PORTABLE CHEST - 1 VIEW  COMPARISON:  09/03/2013 CT.  06/27/2013 chest x-ray  FINDINGS: Small right-sided pleural effusion and right base atelectasis suspected.  Right central line tip distal superior vena cava level.  Sclerotic bony expansion left second rib unchanged. Question metastatic lesion versus result of prior trauma.  Heart size within normal limits.  No pneumothorax.  IMPRESSION: Small right-sided pleural effusion and right base atelectasis suspected.  Right central line tip distal superior vena cava level.  Sclerotic bony expansion left second rib unchanged. Question metastatic lesion versus result of prior trauma.   Electronically Signed   By: Chauncey Cruel M.D.   On: 09/27/2013 22:05    Assessment/Plan Shock Metastatic cholangiocarcinoma with widespread disease. Has biliary stent and capped external drain.  She does not present as if she has acute cholecystitis, no abd pain or tenderness, nl WBC, afebrile. GB wall thickening with surrounding fluid has been present on past several scans. It does look more pronounced on most recent scan but new onset of ascites can give that appearance. Will try to get NM HIDA to see if gallbladder visualizes Will further d/w Dr. Pascal Lux.   Ascencion Dike PA-C 09/10/2013, 10:29 AM

## 2013-09-11 DIAGNOSIS — Z7901 Long term (current) use of anticoagulants: Secondary | ICD-10-CM

## 2013-09-11 DIAGNOSIS — K8309 Other cholangitis: Secondary | ICD-10-CM

## 2013-09-11 DIAGNOSIS — Z86718 Personal history of other venous thrombosis and embolism: Secondary | ICD-10-CM

## 2013-09-11 DIAGNOSIS — A414 Sepsis due to anaerobes: Secondary | ICD-10-CM

## 2013-09-11 LAB — BASIC METABOLIC PANEL
BUN: 10 mg/dL (ref 6–23)
CHLORIDE: 93 meq/L — AB (ref 96–112)
CO2: 13 meq/L — AB (ref 19–32)
CREATININE: 0.47 mg/dL — AB (ref 0.50–1.10)
Calcium: 7.1 mg/dL — ABNORMAL LOW (ref 8.4–10.5)
GFR calc Af Amer: >90 mL/min (ref >90)
GFR calc non Af Amer: >90 mL/min (ref >90)
Glucose, Bld: 53 mg/dL — ABNORMAL LOW (ref 70–99)
Potassium: 3.9 mEq/L (ref 3.7–5.3)
Sodium: 123 mEq/L — ABNORMAL LOW (ref 137–147)

## 2013-09-11 LAB — URINE CULTURE
COLONY COUNT: NO GROWTH
Culture: NO GROWTH

## 2013-09-11 LAB — GLUCOSE, CAPILLARY: Glucose-Capillary: 76 mg/dL (ref 70–99)

## 2013-09-11 LAB — VANCOMYCIN, TROUGH: Vancomycin Tr: 12.7 ug/mL (ref 10.0–20.0)

## 2013-09-11 LAB — CBC
HEMATOCRIT: 32.9 % — AB (ref 36.0–46.0)
HEMOGLOBIN: 11.3 g/dL — AB (ref 12.0–15.0)
MCH: 26.7 pg (ref 26.0–34.0)
MCHC: 34.3 g/dL (ref 30.0–36.0)
MCV: 77.8 fL — AB (ref 78.0–100.0)
Platelets: 124 10*3/uL — ABNORMAL LOW (ref 150–400)
RBC: 4.23 MIL/uL (ref 3.87–5.11)
RDW: 16.3 % — AB (ref 11.5–15.5)
WBC: 13.5 10*3/uL — ABNORMAL HIGH (ref 4.0–10.5)

## 2013-09-11 MED ORDER — SODIUM CHLORIDE 0.9 % IV SOLN
1.0000 mg/h | INTRAVENOUS | Status: DC
  Administered 2013-09-11: 1 mg/h via INTRAVENOUS
  Administered 2013-09-13: 0.5 mg/h via INTRAVENOUS
  Filled 2013-09-11 (×3): qty 5

## 2013-09-11 MED ORDER — HYDROMORPHONE HCL PF 1 MG/ML IJ SOLN
1.0000 mg | INTRAMUSCULAR | Status: DC | PRN
  Administered 2013-09-11 – 2013-09-14 (×4): 1 mg via INTRAVENOUS
  Administered 2013-09-15: 0.5 mg via INTRAVENOUS
  Administered 2013-09-15: 1 mg via INTRAVENOUS
  Administered 2013-09-15 (×2): 0.5 mg via INTRAVENOUS
  Filled 2013-09-11: qty 1

## 2013-09-11 MED ORDER — SODIUM CHLORIDE 0.9 % IV BOLUS (SEPSIS)
250.0000 mL | Freq: Once | INTRAVENOUS | Status: AC
  Administered 2013-09-11: 250 mL via INTRAVENOUS

## 2013-09-11 MED ORDER — SODIUM CHLORIDE 0.9 % IV BOLUS (SEPSIS)
500.0000 mL | Freq: Once | INTRAVENOUS | Status: AC
  Administered 2013-09-11: 500 mL via INTRAVENOUS

## 2013-09-11 MED ORDER — HYDROCORTISONE NA SUCCINATE PF 100 MG IJ SOLR
50.0000 mg | Freq: Four times a day (QID) | INTRAMUSCULAR | Status: DC
  Administered 2013-09-11 – 2013-09-15 (×19): 50 mg via INTRAVENOUS
  Filled 2013-09-11: qty 2
  Filled 2013-09-11 (×2): qty 1
  Filled 2013-09-11 (×2): qty 2
  Filled 2013-09-11 (×16): qty 1
  Filled 2013-09-11: qty 2
  Filled 2013-09-11: qty 1
  Filled 2013-09-11 (×2): qty 2
  Filled 2013-09-11: qty 1

## 2013-09-11 NOTE — Progress Notes (Signed)
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 She is s/p bile duct stent 07/15/13 and placement of external biliary drain above metal stent 1/22. Just Completed palliative XRT of C spine for bone mets. Now on every other week Xeloda. Admitted to ER at [facility] w / 1 week h/o worsening PO intake, weakness and fatigue. On arrival to ER her BP was in the 70s. Received several liters of crystalloid and although felt better BP remained in 80-90s after 4 liters. PCCM was asked to eval      SIGNIFICANT EVENTS / STUDIES:  3/3 CT chest/abd/pelvis: 1. Chronically obstructed gallbladder with new gallbladder wall thickening and pericholecystic collections, concerning for acute cholecystitis. 2. Worsening volume overload with new ascites, pulmonary edema, and larger pulmonary effusions. 3. Negative for pulmonary embolism. 4. Pelvic peritoneal inflammation or tumor spread. 5. Mild distal colitis. 6. Widespread cholangiocarcinoma, recently staged 09/03/2013.  3/3 hida scan : Findings worrisome for cystic duct obstruction / acute  cholecystitis.  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 She is s/p bile duct stent 07/15/13 and placement of external biliary drain above metal stent 1/22. Just Completed palliative XRT of C spine for bone mets. Now on every other week Xeloda. Admitted to ER at [facility] w / 1 week h/o worsening PO intake, weakness and fatigue. [name] an  orthostatic event on 3/2 when she was being assisted up from a chair to the bathroom, and suddenly got SOB, light headed and had transient MS change. 911 was called. On arrival to ER her BP was in the 70s. Received several liters of crystalloid and although felt better BP remained in 80-90s after 4 liters. PCCM was asked to eval      SUBJECTIVE:  Started on dilaudid drip per Oncology. On neo drip at 200. Hr 133. BP 86/45.  VITAL SIGNS: Temp:  [97.3 F (36.3 C)-98.4 F (36.9 C)] 97.3 F (36.3 C) (03/04 0400) Pulse Rate:  [25-140] 74 (03/04 0515) Resp:  [21-49] 31 (03/04 0815) BP: (50-131)/(28-78) 86/61 mmHg (03/04 0815) SpO2:  [85 %-100 %] 91 % (03/04 0800) Weight:  [55.8 kg (123 lb 0.3 oz)] 55.8 kg (123 lb 0.3 oz) (03/04 0400) HEMODYNAMICS:   VENTILATOR SETTINGS:   INTAKE / OUTPUT: Intake/Output     03/03 0701 - 03/04 0700 03/04 0701 - 03/05 0700   P.O. 240    I.V. (mL/kg) 5184.3 (92.9) 200 (3.6)   IV Piggyback 1630    Total Intake(mL/kg) 7054.3 (126.4) 200 (3.6)   Urine (mL/kg/hr) 400 (0.3)    Stool 2 (0)    Total Output 402     Net +6652.3 +200        Urine Occurrence 1 x    Stool Occurrence 1 x      PHYSICAL EXAMINATION: General:  Chronically ill appearing white female, NAD, pain better  Neuro:  Awake, non-focal, no def  HEENT:  [place]. No JVD MM dry  Cardiovascular:  Tachy  rrr  Lungs:  Decreased  Abdomen:  Perc drain w/ dressing intact. Abd soft  Musculoskeletal:  Intact  Skin:  Sacral decub dressed   LABS:  CBC  Recent Labs Lab 10/01/2013 2257 09/11/13 0347  WBC 10.4 13.5*  HGB 11.7* 11.3*  HCT 33.6* 32.9*  PLT 120* 124*   Coag's No results found for this basename: APTT, INR,  in the last 168 hours BMET  Recent Labs Lab 09/11/2013 2209 09/10/13 0635 09/11/13 0347  NA 121* 126* 123*  K 4.7 4.2 3.9  CL 86* 96 93*  CO2 18* 16* 13*  BUN 16 12 10   CREATININE 0.53 0.42* 0.47*  GLUCOSE 80 65* 53*   Electrolytes  Recent Labs Lab 09/19/2013 2209  09/10/13 0635 09/11/13 0347  CALCIUM 8.2* 7.6* 7.1*  MG  --  1.6  --   PHOS  --  3.6  --    Sepsis Markers  Recent Labs Lab 09/19/2013 2209 09/10/13 0347 09/10/13 0635  LATICACIDVEN 3.6* 2.01 1.7  PROCALCITON  --   --  3.65   ABG No results found for this basename: PHART, PCO2ART, PO2ART,  in the last 168 hours Liver Enzymes  Recent Labs Lab 09/12/2013 2209  AST 35  ALT 39*  ALKPHOS 578*  BILITOT 0.6  ALBUMIN 1.8*   Cardiac Enzymes  Recent Labs Lab 09/10/13 0635 09/10/13 1040 09/10/13 1830  TROPONINI <0.30 <0.30 <0.30   Glucose No results found for this basename: GLUCAP,  in the last 168 hours  Imaging Ct Angio Chest W/cm &/or Wo Cm  09/10/2013   CLINICAL DATA:  Weakness.  Shortness of breath.  Cancer.  EXAM: CT ANGIOGRAPHY CHEST  CT ABDOMEN AND PELVIS WITH CONTRAST  TECHNIQUE: Multidetector CT imaging of the chest was performed using the standard protocol during bolus administration of intravenous contrast. Multiplanar CT image reconstructions and MIPs were obtained to evaluate the vascular anatomy. Multidetector CT imaging of the abdomen and pelvis was performed using the standard protocol during bolus administration of intravenous contrast.  CONTRAST:  74mL OMNIPAQUE IOHEXOL 300 MG/ML SOLN, 28mL OMNIPAQUE IOHEXOL 350 MG/ML SOLN  COMPARISON:  None.  FINDINGS: CTA CHEST FINDINGS  THORACIC INLET/BODY WALL:  Right IJ porta catheter, tip at the level of the superior cavoatrial junction. No acute abnormality.  MEDIASTINUM:  No cardiomegaly. Small volume, simple appearing pericardial fluid. No evidence of pulmonary arterial filling defect. No acute systemic arterial abnormality. No lymphadenopathy.  LUNG WINDOWS:  Small to moderate right pleural effusion, water density and layering. There is a small left pleural effusion. There is right more than left dependent atelectasis. Mild pulmonary edema, best seen in the apical and basilar regions where there is interlobular septal  thickening.  OSSEOUS:  Diffuse axial and proximal appendicular skeletal metastases with remote pathologic fractures of the manubrium and T9 body. The recently characterize metastatic disease shows no interval progression. No acute fracture identified.  CT ABDOMEN and PELVIS FINDINGS  ABDOMEN/PELVIS:  Liver: Numerous ill-defined, delayed or hypoenhancing masses are present throughout the liver, distribution and size essentially stable from prior when accounting for differences in contrast timing. There is no new abnormality suggestive of a abscess. The larger lesions measure approximately 2.5-3 cm, located in segment 4 A, segment 8, and subcapsular segment 6.  Biliary: Left approach biliary drain, with pigtail fully formed in the hepatic hilum. There is diffuse pneumobilia. A metallic stent in the distal CBD continues to extend to the mid duodenum. Diffuse dilation of the biliary tree is stable from prior.  Chronic marked distention of the gallbladder with multiple stones and diffuse wall thickening. The wall thickening has increased from prior, and there is pericholecystic fluid accumulation.  Pancreas: Chronic main duct prominence, possibly related to the stent.  Spleen: Unremarkable.  Adrenals: Unremarkable.  Kidneys and ureters: Symmetric enhancement.  No urinary obstruction.  Bladder: Unremarkable.  Reproductive: Unremarkable.  Bowel: Mild circumferential thickening of the descending and sigmoid colon. No bowel obstruction. Normal appendix.  Retroperitoneum: Diffuse retroperitoneal edema which is increased from prior.  Peritoneum: New small volume ascites. There is new, relatively smooth peritoneal thickening in the posterior pelvic recesses. Calcified loose body in the low pelvis.  Vascular: Narrowing of the infrarenal IVC secondary to displaced lumbar spine fracture.  OSSEOUS: Diffuse osseous metastatic disease. There are remote pathologic fractures of the L1 and L3 bodies. The L3 fracture is displaced, with  large ventral fracture fragment deforming the aorta and infrarenal IVC. No acute fracture suspected.  Review of the MIP images confirms the above findings.  IMPRESSION: 1. Chronically obstructed gallbladder with new gallbladder wall thickening and pericholecystic collections, concerning for acute cholecystitis. 2. Worsening volume overload with new ascites, pulmonary edema, and larger pulmonary effusions. 3. Negative for pulmonary embolism. 4. Pelvic peritoneal inflammation or tumor spread. 5. Mild distal colitis. 6. Widespread cholangiocarcinoma, recently staged 09/03/2013.   Electronically Signed   By: Jorje Guild M.D.   On: 09/10/2013 03:29   Nm Hepatobiliary  09/10/2013   CLINICAL DATA:  History of metastatic cholangiocarcinoma, post percutaneous biliary drainage catheter placement and subsequent internal biliary stent placement. Patient now presents with weakness and shock with recent CT imaging demonstrating persistent gallbladder wall thickening and pericholecystic fluid, likely progressed since recent prior examinations. Please perform HIDA scan for further evaluation.  EXAM: NUCLEAR MEDICINE HEPATOBILIARY IMAGING  TECHNIQUE: Sequential images of the abdomen were obtained out to 60 minutes following intravenous administration of radiopharmaceutical.  COMPARISON:  CT ABD/PELVIS W CM dated 09/10/2013; CT ABD/PELVIS W CM dated 09/03/2013; CT ABD/PELVIS W CM dated 06/28/2013  RADIOPHARMACEUTICALS:  4.9 mCi Tc-28m Choletec  FINDINGS: There is relative homogeneous distribution of injected radiotracer throughout the hepatic parenchyma.  There is early excretion of injected radiotracer through the internal biliary stent was opacification of the proximal small bowel, initially seen on the 20 min anterior projection planar image.  Despite imaging for a total of 2 hr, the gallbladder was never definitively identified.  IMPRESSION: 1. Findings worrisome for cystic duct obstruction / acute cholecystitis. 2. Internal  biliary stent appears widely patent.  PLAN: Potential placement of percutaneous cholecystostomy tube was discussed with the patient, however given the patient's advanced metastatic disease, she wishes to consider delay cholecystostomy tube placement as she is more interested in pain management rather then additional interventions at the present time.  Above findings discussed with Dr. Benay Spice at the time of procedure completion.   Electronically Signed   By: Sandi Mariscal M.D.   On: 09/10/2013 15:40   Ct Abdomen Pelvis W Contrast  09/10/2013   CLINICAL DATA:  Weakness.  Shortness of breath.  Cancer.  EXAM: CT ANGIOGRAPHY CHEST  CT ABDOMEN AND PELVIS WITH CONTRAST  TECHNIQUE: Multidetector CT imaging of the chest was performed using the standard protocol during bolus administration of intravenous contrast. Multiplanar CT image reconstructions and MIPs were obtained to evaluate the vascular anatomy. Multidetector CT imaging of the abdomen and pelvis was performed using the standard protocol during bolus administration of intravenous contrast.  CONTRAST:  65mL OMNIPAQUE IOHEXOL 300 MG/ML SOLN, 64mL OMNIPAQUE  IOHEXOL 350 MG/ML SOLN  COMPARISON:  None.  FINDINGS: CTA CHEST FINDINGS  THORACIC INLET/BODY WALL:  Right IJ porta catheter, tip at the level of the superior cavoatrial junction. No acute abnormality.  MEDIASTINUM:  No cardiomegaly. Small volume, simple appearing pericardial fluid. No evidence of pulmonary arterial filling defect. No acute systemic arterial abnormality. No lymphadenopathy.  LUNG WINDOWS:  Small to moderate right pleural effusion, water density and layering. There is a small left pleural effusion. There is right more than left dependent atelectasis. Mild pulmonary edema, best seen in the apical and basilar regions where there is interlobular septal thickening.  OSSEOUS:  Diffuse axial and proximal appendicular skeletal metastases with remote pathologic fractures of the manubrium and T9 body. The  recently characterize metastatic disease shows no interval progression. No acute fracture identified.  CT ABDOMEN and PELVIS FINDINGS  ABDOMEN/PELVIS:  Liver: Numerous ill-defined, delayed or hypoenhancing masses are present throughout the liver, distribution and size essentially stable from prior when accounting for differences in contrast timing. There is no new abnormality suggestive of a abscess. The larger lesions measure approximately 2.5-3 cm, located in segment 4 A, segment 8, and subcapsular segment 6.  Biliary: Left approach biliary drain, with pigtail fully formed in the hepatic hilum. There is diffuse pneumobilia. A metallic stent in the distal CBD continues to extend to the mid duodenum. Diffuse dilation of the biliary tree is stable from prior. Chronic marked distention of the gallbladder with multiple stones and diffuse wall thickening. The wall thickening has increased from prior, and there is pericholecystic fluid accumulation.  Pancreas: Chronic main duct prominence, possibly related to the stent.  Spleen: Unremarkable.  Adrenals: Unremarkable.  Kidneys and ureters: Symmetric enhancement.  No urinary obstruction.  Bladder: Unremarkable.  Reproductive: Unremarkable.  Bowel: Mild circumferential thickening of the descending and sigmoid colon. No bowel obstruction. Normal appendix.  Retroperitoneum: Diffuse retroperitoneal edema which is increased from prior.  Peritoneum: New small volume ascites. There is new, relatively smooth peritoneal thickening in the posterior pelvic recesses. Calcified loose body in the low pelvis.  Vascular: Narrowing of the infrarenal IVC secondary to displaced lumbar spine fracture.  OSSEOUS: Diffuse osseous metastatic disease. There are remote pathologic fractures of the L1 and L3 bodies. The L3 fracture is displaced, with large ventral fracture fragment deforming the aorta and infrarenal IVC. No acute fracture suspected.  Review of the MIP images confirms the above  findings.  IMPRESSION: 1. Chronically obstructed gallbladder with new gallbladder wall thickening and pericholecystic collections, concerning for acute cholecystitis. 2. Worsening volume overload with new ascites, pulmonary edema, and larger pulmonary effusions. 3. Negative for pulmonary embolism. 4. Pelvic peritoneal inflammation or tumor spread. 5. Mild distal colitis. 6. Widespread cholangiocarcinoma, recently staged 09/03/2013.   Electronically Signed   By: Jorje Guild M.D.   On: 09/10/2013 03:29   Dg Chest Port 1 View  09/28/2013   CLINICAL DATA:  Weakness.  Hypoxia.  Cholangiocarcinoma.  EXAM: PORTABLE CHEST - 1 VIEW  COMPARISON:  09/03/2013 CT.  06/27/2013 chest x-ray  FINDINGS: Small right-sided pleural effusion and right base atelectasis suspected.  Right central line tip distal superior vena cava level.  Sclerotic bony expansion left second rib unchanged. Question metastatic lesion versus result of prior trauma.  Heart size within normal limits.  No pneumothorax.  IMPRESSION: Small right-sided pleural effusion and right base atelectasis suspected.  Right central line tip distal superior vena cava level.  Sclerotic bony expansion left second rib unchanged. Question metastatic lesion versus result of prior  trauma.   Electronically Signed   By: Chauncey Cruel M.D.   On: 09/08/2013 22:05     CXR:   ASSESSMENT / PLAN:  PULMONARY A: Right > left pleural effusion  No sig dyspnea  P:   Wean FIO2 Supplemental O2 as needed.  Could consider sampling pleural fluid at some point in future if a clear indication to do so. At this point I would defer  CARDIOVASCULAR A:  Severe sepsis/septic vs hypovolemic shock       Sinus tachy P:  IV hydration  Ck cortisol > 9.1 neo synephrine for SBP >100  3/4 > added solucortef 50 q 6 h (this may help with refractory bp issue) try to avoid levo with tachycardia present. Plan for now will be to continue IV abx and neo to see if she responds without invasive  procedures.  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 She is s/p bile duct stent 07/15/13 and placement of external biliary drain above metal stent 1/22 P:   Clear liquids   Appreciate IR's assistance. Nothing clinically to suggest acute chole or malfunctioning biliary drain   HEMATOLOGIC/ ONC A:   Mild anemia H/o DVT-->On LMWH P:  Cont LMWH  Trend cbc  Believe we will need to seriously address goals of care with patient with assistance and participation of Dr Benay Spice. We are aggressively treating pain but also using life support measures. She would like to defer cholecystostomy she doesn't turn around with Abx then we may need to acknowledge that the narcotics she is requiring are driving her hypotension.   INFECTIOUS A:  Severe sepsis/septic shock.  >consider cholecystitis superimposed on chronically obstructed GB from her Cholangiocarcinoma. Hida scan supports this dx P:   Pan culture Empiric abx > vanco + pip/tazo Hida > Findings worrisome for cystic duct obstruction / acute cholecystitis. At this time pt wants to defer a perc drain   ENDOCRINE A:  No acute  P:   Trend cbg Ck cortisol > pending  NEUROLOGIC A:  Chronic cancer pain.  She is in terrible pain. All chronic. This includes her neck, arm spine and sacrum P:   Supportive care  Placed on dilaudid drip per oncology.   GLOBAL / GOALS OF CARE:  Believe we will need to seriously address goals of care with patient with assistance and participation of  Dr Benay Spice. We are aggressively treating pain but also using life support measures. She would like to defer cholecystostomy she doesn't turn around with Abx then we may need to acknowledge that the narcotics she is requiring are driving her hypotension. I explained this to pt and husband today 3/4 and they understand that withdrawal of the pressors is a possible outcome here.   TODAY'S SUMMARY:  3/4 add solucortef, pain control, maxed out on neo, ns 500 cc bolus. Pt wants to defer perc drain chole, let abx work and focus on pain control.   35 minutes CC time  Richardson Landry Minor  ACNP Maryanna Shape PCCM Pager [phone] till 3 pm If no answer page [phone] 09/11/2013, 8:50 AM   Baltazar Apo, MD, PhD 09/11/2013, 10:51 AM [facility] [phone] or if no answer [phone]

## 2013-09-11 NOTE — Clinical Documentation Improvement (Signed)
PLEASE NOTE IF PRESENT ON ADMISSION  Noted 09/10/13 H&P.Marland KitchenMarland Kitchen"Skin:  Sacral decub dressed .. 09/10/13 Nutr Eval..Marland Kitchen"Skin: Skin tear on sacrum, jaundice, stage III back pressure ulcer."... TX: 09/11/2013 per Doc Flowsheet " Sacral Foam Dressing Prophylactic Protocol initiated & due to be changed 09/13/13" .  For accurate Dx specificity & severity can noted " Sacral decub" be further specified with location/ stage & POA for cond being mon'd, eval'd & tx'd. Thank you  Possible Clinical Conditions?  Stage  I  Pressure Ulcer   (reddening of the skin) Stage  II Pressure Ulcer  (blister open or unopened) Stage  III Pressure Ulcer (through all layers skin) Stage IV Pressure Ulcer   (through skin & underlying  muscle, tendons, and bones) Other Condition Cannot Clinically Determine   Supporting Information: Signs & Symptoms: See above note Skin Assessment: See above note Wound Nurse Assessment ([place]): none noted Treatment: See above note  Thank You, Ermelinda Das, RN, BSN, CCDS Certified Clinical Documentation Specialist Pager: [facility]

## 2013-09-11 NOTE — Progress Notes (Signed)
IP PROGRESS NOTE  Subjective:   Bianca Logan is well-known to me with a history of metastatic cholangiocarcinoma. She was admitted yesterday with sepsis syndrome. Imaging studies suggest possible cholecystitis. She complains of pain in the right leg and back. Both legs are weaker.  Objective: Vital signs in last 24 hours: Blood pressure 90/65, pulse 74, temperature 97.5 F (36.4 C), temperature source Oral, resp. rate 39, height 5\' 3"  (1.6 m), weight 123 lb 0.3 oz (55.8 kg), SpO2 100.00%.  Intake/Output from previous day: 03/03 0701 - 03/04 0700 In: 7054.3 [P.O.:240; I.V.:5184.3; IV Piggyback:1630] Out: 402 [Urine:400; Stool:2]  Physical Exam:  Lungs: Increased respiratory rate, clear anteriorly Cardiac: Regular rate and rhythm, tachycardia Abdomen: Tender in the right mid abdomen, no mass, no hepatomegaly, hypoactive bowel sounds Extremities: No leg edema. The extremities are cool. Neurologic: Alert and oriented, the foot strength appears intact bilaterally, she can flex and extend both knees.  Portacath/PICC-without erythema  Lab Results:  Recent Labs  10/06/2013 2257 09/11/13 0347  WBC 10.4 13.5*  HGB 11.7* 11.3*  HCT 33.6* 32.9*  PLT 120* 124*    BMET  Recent Labs  09/10/13 0635 09/11/13 0347  NA 126* 123*  K 4.2 3.9  CL 96 93*  CO2 16* 13*  GLUCOSE 65* 53*  BUN 12 10  CREATININE 0.42* 0.47*  CALCIUM 7.6* 7.1*    Studies/Results: Ct Angio Chest W/cm &/or Wo Cm  09/10/2013   CLINICAL DATA:  Weakness.  Shortness of breath.  Cancer.  EXAM: CT ANGIOGRAPHY CHEST  CT ABDOMEN AND PELVIS WITH CONTRAST  TECHNIQUE: Multidetector CT imaging of the chest was performed using the standard protocol during bolus administration of intravenous contrast. Multiplanar CT image reconstructions and MIPs were obtained to evaluate the vascular anatomy. Multidetector CT imaging of the abdomen and pelvis was performed using the standard protocol during bolus administration of intravenous  contrast.  CONTRAST:  38mL OMNIPAQUE IOHEXOL 300 MG/ML SOLN, 53mL OMNIPAQUE IOHEXOL 350 MG/ML SOLN  COMPARISON:  None.  FINDINGS: CTA CHEST FINDINGS  THORACIC INLET/BODY WALL:  Right IJ porta catheter, tip at the level of the superior cavoatrial junction. No acute abnormality.  MEDIASTINUM:  No cardiomegaly. Small volume, simple appearing pericardial fluid. No evidence of pulmonary arterial filling defect. No acute systemic arterial abnormality. No lymphadenopathy.  LUNG WINDOWS:  Small to moderate right pleural effusion, water density and layering. There is a small left pleural effusion. There is right more than left dependent atelectasis. Mild pulmonary edema, best seen in the apical and basilar regions where there is interlobular septal thickening.  OSSEOUS:  Diffuse axial and proximal appendicular skeletal metastases with remote pathologic fractures of the manubrium and T9 body. The recently characterize metastatic disease shows no interval progression. No acute fracture identified.  CT ABDOMEN and PELVIS FINDINGS  ABDOMEN/PELVIS:  Liver: Numerous ill-defined, delayed or hypoenhancing masses are present throughout the liver, distribution and size essentially stable from prior when accounting for differences in contrast timing. There is no new abnormality suggestive of a abscess. The larger lesions measure approximately 2.5-3 cm, located in segment 4 A, segment 8, and subcapsular segment 6.  Biliary: Left approach biliary drain, with pigtail fully formed in the hepatic hilum. There is diffuse pneumobilia. A metallic stent in the distal CBD continues to extend to the mid duodenum. Diffuse dilation of the biliary tree is stable from prior. Chronic marked distention of the gallbladder with multiple stones and diffuse wall thickening. The wall thickening has increased from prior, and there is pericholecystic  fluid accumulation.  Pancreas: Chronic main duct prominence, possibly related to the stent.  Spleen:  Unremarkable.  Adrenals: Unremarkable.  Kidneys and ureters: Symmetric enhancement.  No urinary obstruction.  Bladder: Unremarkable.  Reproductive: Unremarkable.  Bowel: Mild circumferential thickening of the descending and sigmoid colon. No bowel obstruction. Normal appendix.  Retroperitoneum: Diffuse retroperitoneal edema which is increased from prior.  Peritoneum: New small volume ascites. There is new, relatively smooth peritoneal thickening in the posterior pelvic recesses. Calcified loose body in the low pelvis.  Vascular: Narrowing of the infrarenal IVC secondary to displaced lumbar spine fracture.  OSSEOUS: Diffuse osseous metastatic disease. There are remote pathologic fractures of the L1 and L3 bodies. The L3 fracture is displaced, with large ventral fracture fragment deforming the aorta and infrarenal IVC. No acute fracture suspected.  Review of the MIP images confirms the above findings.  IMPRESSION: 1. Chronically obstructed gallbladder with new gallbladder wall thickening and pericholecystic collections, concerning for acute cholecystitis. 2. Worsening volume overload with new ascites, pulmonary edema, and larger pulmonary effusions. 3. Negative for pulmonary embolism. 4. Pelvic peritoneal inflammation or tumor spread. 5. Mild distal colitis. 6. Widespread cholangiocarcinoma, recently staged 09/03/2013.   Electronically Signed   By: Jorje Guild M.D.   On: 09/10/2013 03:29   Nm Hepatobiliary  09/10/2013   CLINICAL DATA:  History of metastatic cholangiocarcinoma, post percutaneous biliary drainage catheter placement and subsequent internal biliary stent placement. Patient now presents with weakness and shock with recent CT imaging demonstrating persistent gallbladder wall thickening and pericholecystic fluid, likely progressed since recent prior examinations. Please perform HIDA scan for further evaluation.  EXAM: NUCLEAR MEDICINE HEPATOBILIARY IMAGING  TECHNIQUE: Sequential images of the abdomen  were obtained out to 60 minutes following intravenous administration of radiopharmaceutical.  COMPARISON:  CT ABD/PELVIS W CM dated 09/10/2013; CT ABD/PELVIS W CM dated 09/03/2013; CT ABD/PELVIS W CM dated 06/28/2013  RADIOPHARMACEUTICALS:  4.9 mCi Tc-38m Choletec  FINDINGS: There is relative homogeneous distribution of injected radiotracer throughout the hepatic parenchyma.  There is early excretion of injected radiotracer through the internal biliary stent was opacification of the proximal small bowel, initially seen on the 20 min anterior projection planar image.  Despite imaging for a total of 2 hr, the gallbladder was never definitively identified.  IMPRESSION: 1. Findings worrisome for cystic duct obstruction / acute cholecystitis. 2. Internal biliary stent appears widely patent.  PLAN: Potential placement of percutaneous cholecystostomy tube was discussed with the patient, however given the patient's advanced metastatic disease, she wishes to consider delay cholecystostomy tube placement as she is more interested in pain management rather then additional interventions at the present time.  Above findings discussed with Dr. Benay Spice at the time of procedure completion.   Electronically Signed   By: Sandi Mariscal M.D.   On: 09/10/2013 15:40   Ct Abdomen Pelvis W Contrast  09/10/2013   CLINICAL DATA:  Weakness.  Shortness of breath.  Cancer.  EXAM: CT ANGIOGRAPHY CHEST  CT ABDOMEN AND PELVIS WITH CONTRAST  TECHNIQUE: Multidetector CT imaging of the chest was performed using the standard protocol during bolus administration of intravenous contrast. Multiplanar CT image reconstructions and MIPs were obtained to evaluate the vascular anatomy. Multidetector CT imaging of the abdomen and pelvis was performed using the standard protocol during bolus administration of intravenous contrast.  CONTRAST:  40mL OMNIPAQUE IOHEXOL 300 MG/ML SOLN, 38mL OMNIPAQUE IOHEXOL 350 MG/ML SOLN  COMPARISON:  None.  FINDINGS: CTA CHEST  FINDINGS  THORACIC INLET/BODY WALL:  Right IJ porta catheter, tip  at the level of the superior cavoatrial junction. No acute abnormality.  MEDIASTINUM:  No cardiomegaly. Small volume, simple appearing pericardial fluid. No evidence of pulmonary arterial filling defect. No acute systemic arterial abnormality. No lymphadenopathy.  LUNG WINDOWS:  Small to moderate right pleural effusion, water density and layering. There is a small left pleural effusion. There is right more than left dependent atelectasis. Mild pulmonary edema, best seen in the apical and basilar regions where there is interlobular septal thickening.  OSSEOUS:  Diffuse axial and proximal appendicular skeletal metastases with remote pathologic fractures of the manubrium and T9 body. The recently characterize metastatic disease shows no interval progression. No acute fracture identified.  CT ABDOMEN and PELVIS FINDINGS  ABDOMEN/PELVIS:  Liver: Numerous ill-defined, delayed or hypoenhancing masses are present throughout the liver, distribution and size essentially stable from prior when accounting for differences in contrast timing. There is no new abnormality suggestive of a abscess. The larger lesions measure approximately 2.5-3 cm, located in segment 4 A, segment 8, and subcapsular segment 6.  Biliary: Left approach biliary drain, with pigtail fully formed in the hepatic hilum. There is diffuse pneumobilia. A metallic stent in the distal CBD continues to extend to the mid duodenum. Diffuse dilation of the biliary tree is stable from prior. Chronic marked distention of the gallbladder with multiple stones and diffuse wall thickening. The wall thickening has increased from prior, and there is pericholecystic fluid accumulation.  Pancreas: Chronic main duct prominence, possibly related to the stent.  Spleen: Unremarkable.  Adrenals: Unremarkable.  Kidneys and ureters: Symmetric enhancement.  No urinary obstruction.  Bladder: Unremarkable.  Reproductive:  Unremarkable.  Bowel: Mild circumferential thickening of the descending and sigmoid colon. No bowel obstruction. Normal appendix.  Retroperitoneum: Diffuse retroperitoneal edema which is increased from prior.  Peritoneum: New small volume ascites. There is new, relatively smooth peritoneal thickening in the posterior pelvic recesses. Calcified loose body in the low pelvis.  Vascular: Narrowing of the infrarenal IVC secondary to displaced lumbar spine fracture.  OSSEOUS: Diffuse osseous metastatic disease. There are remote pathologic fractures of the L1 and L3 bodies. The L3 fracture is displaced, with large ventral fracture fragment deforming the aorta and infrarenal IVC. No acute fracture suspected.  Review of the MIP images confirms the above findings.  IMPRESSION: 1. Chronically obstructed gallbladder with new gallbladder wall thickening and pericholecystic collections, concerning for acute cholecystitis. 2. Worsening volume overload with new ascites, pulmonary edema, and larger pulmonary effusions. 3. Negative for pulmonary embolism. 4. Pelvic peritoneal inflammation or tumor spread. 5. Mild distal colitis. 6. Widespread cholangiocarcinoma, recently staged 09/03/2013.   Electronically Signed   By: Jorje Guild M.D.   On: 09/10/2013 03:29   Dg Chest Port 1 View  2013-10-04   CLINICAL DATA:  Weakness.  Hypoxia.  Cholangiocarcinoma.  EXAM: PORTABLE CHEST - 1 VIEW  COMPARISON:  09/03/2013 CT.  06/27/2013 chest x-ray  FINDINGS: Small right-sided pleural effusion and right base atelectasis suspected.  Right central line tip distal superior vena cava level.  Sclerotic bony expansion left second rib unchanged. Question metastatic lesion versus result of prior trauma.  Heart size within normal limits.  No pneumothorax.  IMPRESSION: Small right-sided pleural effusion and right base atelectasis suspected.  Right central line tip distal superior vena cava level.  Sclerotic bony expansion left second rib unchanged.  Question metastatic lesion versus result of prior trauma.   Electronically Signed   By: Chauncey Cruel M.D.   On: Oct 04, 2013 22:05    Medications: I have  reviewed the patient's current medications.  Assessment/Plan: 1. Metastatic cholangiocarcinoma with extensive bone and liver metastases, scheduled to begin palliative capecitabine this week  2. History of obstructive jaundice secondary to bile duct tumor, external biliary drain remains in place with a bile duct stent placed 07/15/2013.  3. CT/nuclear medicine evidence of cholecystitis 09/10/2013  4. Pain secondary to extensive bone metastases including a destructive mass at L3 causing radicular pain in the right leg. She completed palliative radiation to the neck 09/06/2013  5. Admission with sepsis syndrome 06/28/2013, cultures negative, likely cholangitis  6. Left lower extremity DVT 07/10/2013-maintained on Lovenox  7. History of sacral and thoracic decubitus ulcers  8. History of hypercalcemia of malignancy, status post Zometa 06/28/2013  9. Admission 09/10/2013 with sepsis syndrome-now maintained on broad-spectrum intravenous antibiotics, fluids, and pressor support per critical care medicine   Ms. Lefevers is critically ill with sepsis syndrome in the setting of advanced metastatic cholangiocarcinoma. I discussed the situation with her husband. He understands she may not survive the current hospital admission. They have decided on no CPR or intubation.  She will be a candidate for home hospice care if she survives the current sepsis.  Recommendations: 1. Continue antibiotics and blood pressure support per critical care medicine with hope for clinical improvement over the next 24 hours 2. Begin Dilaudid drip for pain  I will continue following her with the critical care service.    LOS: 2 days   [place]  09/11/2013, 1:58 PM

## 2013-09-12 DIAGNOSIS — R6510 Systemic inflammatory response syndrome (SIRS) of non-infectious origin without acute organ dysfunction: Secondary | ICD-10-CM

## 2013-09-12 DIAGNOSIS — R932 Abnormal findings on diagnostic imaging of liver and biliary tract: Secondary | ICD-10-CM

## 2013-09-12 LAB — CBC
HCT: 28.9 % — ABNORMAL LOW (ref 36.0–46.0)
HEMOGLOBIN: 10.1 g/dL — AB (ref 12.0–15.0)
MCH: 27 pg (ref 26.0–34.0)
MCHC: 34.9 g/dL (ref 30.0–36.0)
MCV: 77.3 fL — ABNORMAL LOW (ref 78.0–100.0)
Platelets: 70 10*3/uL — ABNORMAL LOW (ref 150–400)
RBC: 3.74 MIL/uL — ABNORMAL LOW (ref 3.87–5.11)
RDW: 16.6 % — AB (ref 11.5–15.5)
WBC: 7.8 10*3/uL (ref 4.0–10.5)

## 2013-09-12 LAB — BASIC METABOLIC PANEL
BUN: 15 mg/dL (ref 6–23)
BUN: 14 mg/dL (ref 6–23)
CHLORIDE: 96 meq/L (ref 96–112)
CO2: 14 mEq/L — ABNORMAL LOW (ref 19–32)
CO2: 14 mEq/L — ABNORMAL LOW (ref 19–32)
CREATININE: 0.47 mg/dL — AB (ref 0.50–1.10)
Calcium: 7.3 mg/dL — ABNORMAL LOW (ref 8.4–10.5)
Calcium: 7.6 mg/dL — ABNORMAL LOW (ref 8.4–10.5)
Chloride: 95 mEq/L — ABNORMAL LOW (ref 96–112)
Creatinine, Ser: 0.47 mg/dL — ABNORMAL LOW (ref 0.50–1.10)
GFR calc Af Amer: >90 mL/min (ref >90)
GFR calc Af Amer: >90 mL/min (ref >90)
Glucose, Bld: 108 mg/dL — ABNORMAL HIGH (ref 70–99)
Glucose, Bld: 79 mg/dL (ref 70–99)
POTASSIUM: 3.3 meq/L — AB (ref 3.7–5.3)
Potassium: 4.1 mEq/L (ref 3.7–5.3)
SODIUM: 124 meq/L — AB (ref 137–147)
Sodium: 125 mEq/L — ABNORMAL LOW (ref 137–147)

## 2013-09-12 MED ORDER — VANCOMYCIN HCL IN DEXTROSE 750-5 MG/150ML-% IV SOLN
750.0000 mg | Freq: Two times a day (BID) | INTRAVENOUS | Status: DC
Start: 2013-09-12 — End: 2013-09-15
  Administered 2013-09-12 – 2013-09-14 (×5): 750 mg via INTRAVENOUS
  Filled 2013-09-12 (×6): qty 150

## 2013-09-12 MED ORDER — POTASSIUM CHLORIDE 10 MEQ/100ML IV SOLN
10.0000 meq | INTRAVENOUS | Status: AC
  Administered 2013-09-12 (×3): 10 meq via INTRAVENOUS
  Filled 2013-09-12 (×3): qty 100

## 2013-09-12 NOTE — Progress Notes (Signed)
ANTIBIOTIC CONSULT NOTE - FOLLOW UP  Pharmacy Consult for Vancomycin Indication: rule out sepsis  No Known Allergies  Patient Measurements: Height: 5\' 3"  (160 cm) Weight: 123 lb 0.3 oz (55.8 kg) IBW/kg (Calculated) : 52.4 Adjusted Body Weight:   Vital Signs: Temp: 97.1 F (36.2 C) (03/05 0000) Temp src: Axillary (03/05 0000) BP: 122/82 mmHg (03/05 0145) Pulse Rate: 111 (03/05 0145) Intake/Output from previous day: 03/04 0701 - 03/05 0700 In: 4479 [P.O.:360; I.V.:3769; IV Piggyback:350] Out: 400 [Urine:400] Intake/Output from this shift: Total I/O In: 1497 [I.V.:1347; IV Piggyback:150] Out: -   Labs:  Recent Labs  10/04/13 2209 10-04-13 2257 09/10/13 0635 09/11/13 0347  WBC  --  10.4  --  13.5*  HGB  --  11.7*  --  11.3*  PLT  --  120*  --  124*  CREATININE 0.53  --  0.42* 0.47*   Estimated Creatinine Clearance: 61.9 ml/min (by C-G formula based on Cr of 0.47).  Recent Labs  09/11/13 2300  VANCOTROUGH 12.7     Microbiology: Recent Results (from the past 720 hour(s))  TECHNOLOGIST REVIEW     Status: None   Collection Time    08/19/13  9:56 AM      Result Value Ref Range Status   Technologist Review Metas and Myelocytes present   Final  TECHNOLOGIST REVIEW     Status: None   Collection Time    09/03/13 12:08 PM      Result Value Ref Range Status   Technologist Review     Final   Value: Metas and Myelocytes present, mod toxic granulation  URINE CULTURE     Status: None   Collection Time    09/10/13 12:23 AM      Result Value Ref Range Status   Specimen Description URINE, CATHETERIZED   Final   Special Requests NONE   Final   Culture  Setup Time     Final   Value: 09/10/2013 09:00     Performed at [place]     Final   Value: NO GROWTH     Performed at [facility]   Culture     Final   Value: NO GROWTH     Performed at [facility]   Report Status 09/11/2013 FINAL   Final  CULTURE, BLOOD (ROUTINE X 2)      Status: None   Collection Time    09/10/13 12:44 AM      Result Value Ref Range Status   Specimen Description BLOOD RIGHT ANTECUBITAL   Final   Special Requests BOTTLES DRAWN AEROBIC AND ANAEROBIC 3CC   Final   Culture  Setup Time     Final   Value: 09/10/2013 02:38     Performed at [facility]   Culture     Final   Value:        BLOOD CULTURE RECEIVED NO GROWTH TO DATE CULTURE WILL BE HELD FOR 5 DAYS BEFORE ISSUING A FINAL NEGATIVE REPORT     Performed at [facility]   Report Status PENDING   Incomplete  CULTURE, BLOOD (ROUTINE X 2)     Status: None   Collection Time    09/10/13 12:45 AM      Result Value Ref Range Status   Specimen Description BLOOD LEFT HAND   Final   Special Requests BOTTLES DRAWN AEROBIC ONLY [place]   Final   Culture  Setup Time  Final   Value: 09/10/2013 02:39     Performed at [facility]   Culture     Final   Value:        BLOOD CULTURE RECEIVED NO GROWTH TO DATE CULTURE WILL BE HELD FOR 5 DAYS BEFORE ISSUING A FINAL NEGATIVE REPORT     Performed at [facility]   Report Status PENDING   Incomplete  MRSA PCR SCREENING     Status: None   Collection Time    09/10/13  8:19 AM      Result Value Ref Range Status   MRSA by PCR NEGATIVE  NEGATIVE Final   Comment:            The GeneXpert MRSA Assay (FDA     approved for NASAL specimens     only), is one component of a     comprehensive MRSA colonization     surveillance program. It is not     intended to diagnose MRSA     infection nor to guide or     monitor treatment for     MRSA infections.    Anti-infectives   Start     Dose/Rate Route Frequency Ordered Stop   09/12/13 1000  vancomycin (VANCOCIN) IVPB 750 mg/150 ml premix     750 mg 150 mL/hr over 60 Minutes Intravenous Every 12 hours 09/12/13 0304     09/10/13 1200  vancomycin (VANCOCIN) 500 mg in sodium chloride 0.9 % 100 mL IVPB  Status:  Discontinued     500 mg 100 mL/hr over 60 Minutes Intravenous Every  12 hours 09/10/13 0626 09/12/13 0305   09/10/13 0800  piperacillin-tazobactam (ZOSYN) IVPB 3.375 g     3.375 g 12.5 mL/hr over 240 Minutes Intravenous Every 8 hours 09/10/13 0626     09/10/13 0045  vancomycin (VANCOCIN) IVPB 750 mg/150 ml premix     750 mg 150 mL/hr over 60 Minutes Intravenous  Once 09/10/13 0021 09/10/13 0211   09/10/13 0000  piperacillin-tazobactam (ZOSYN) IVPB 3.375 g     3.375 g 100 mL/hr over 30 Minutes Intravenous  Once Oct 07, 2013 2359 09/10/13 0120      Assessment: Patient with low vancomycin level.    Goal of Therapy:  Vancomycin trough level 15-20 mcg/ml  Plan:  Measure antibiotic drug levels at steady state Follow up culture results Change vancomycin to 750mg  iv q12hr  Nani Skillern Crowford 09/12/2013,3:08 AM

## 2013-09-12 NOTE — Progress Notes (Signed)
Pt was in bed asleep when I arrived; pt's husband friend, daughter and friend were bedside. Made introductions; offered pastoral care. Family was very receptive and appreciative for visit.  Ernest Haber Chaplain

## 2013-09-12 NOTE — Progress Notes (Signed)
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 She is s/p bile duct stent 07/15/13 and placement of external biliary drain above metal stent 1/22. Just Completed palliative XRT of C spine for bone mets. Now on every other week Xeloda. Admitted to ER at [facility] w / 1 week h/o worsening PO intake, weakness and fatigue. On arrival to ER her BP was in the 70s. Received several liters of crystalloid and although felt better BP remained in 80-90s after 4 liters. PCCM was asked to eval      SIGNIFICANT EVENTS / STUDIES:  3/3 CT chest/abd/pelvis: 1. Chronically obstructed gallbladder with new gallbladder wall thickening and pericholecystic collections, concerning for acute cholecystitis. 2. Worsening volume overload with new ascites, pulmonary edema, and larger pulmonary effusions. 3. Negative for pulmonary embolism. 4. Pelvic peritoneal inflammation or tumor spread. 5. Mild distal colitis. 6. Widespread cholangiocarcinoma, recently staged 09/03/2013.  3/3 hida scan : Findings worrisome for cystic duct obstruction / acute  Cholecystitis. 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 She is s/p bile duct stent 07/15/13 and placement of external biliary drain above metal stent 1/22. Just Completed palliative XRT of C spine for bone mets. Now on every other week Xeloda. Admitted to ER at [facility] w / 1 week h/o worsening PO intake, weakness and fatigue. This  cumulated into what sounded like per her husband an orthostatic event on 3/2 when she was being assisted up from a chair to the bathroom, and suddenly got SOB, light headed and had transient MS change. 911 was called. On arrival to ER her BP was in the 70s. Received several liters of crystalloid and although felt better BP remained in 80-90s after 4 liters. PCCM was asked to eval      SUBJECTIVE:  Started on dilaudid drip per Oncology. Dilaudid drip at 0.5mg /hr, Neo-off. Remains tachy at 130bpm.  VITAL SIGNS: Temp:  [97.1 F (36.2 C)-97.7 F (36.5 C)] 97.3 F (36.3 C) (03/05 0400) Pulse Rate:  [75-134] 126 (03/05 0830) Resp:  [20-48] 31 (03/05 0830) BP: (61-141)/(16-93) 88/66 mmHg (03/05 0830) SpO2:  [80 %-100 %] 97 % (03/05 0830) Weight:  [61 kg (134 lb 7.7 oz)] 61 kg (134 lb 7.7 oz) (03/05 0315) HEMODYNAMICS:   VENTILATOR SETTINGS:   INTAKE / OUTPUT: Intake/Output     03/04 0701 - 03/05 0700 03/05 0701 - 03/06 0700   P.O. 360    I.V. (mL/kg) 4505 (73.9) 250 (4.1)   IV Piggyback 350 50   Total Intake(mL/kg) 5215 (85.5) 300 (4.9)   Urine (mL/kg/hr) 400 (0.3) 700 (6.1)   Stool     Total Output 400 700   Net +4815 -400          PHYSICAL EXAMINATION: General:  Chronically ill appearing white female, NAD, pain better  Neuro:  Awake, non-focal, no def  HEENT:  [place]. No JVD MM dry  Cardiovascular:  Tachy rrr  Lungs:  Decreased  Abdomen:  Perc drain w/ dressing intact. Abd soft  Musculoskeletal:  Intact  Skin:  Sacral decub dressed   LABS:  CBC  Recent Labs Lab 09/10/2013 2257 09/11/13 0347 09/12/13 0500  WBC 10.4 13.5* 7.8  HGB 11.7* 11.3* 10.1*  HCT 33.6* 32.9* 28.9*  PLT 120* 124* 70*   Coag's No results found for this basename: APTT, INR,  in the last 168 hours BMET  Recent Labs Lab 09/10/13 0635 09/11/13 0347 09/12/13 0500  NA 126* 123* 125*  K 4.2 3.9 3.3*  CL 96 93* 96  CO2 16* 13* 14*  BUN 12 10 15   CREATININE 0.42* 0.47* 0.47*  GLUCOSE 65* 53*  108*   Electrolytes  Recent Labs Lab 09/10/13 0635 09/11/13 0347 09/12/13 0500  CALCIUM 7.6* 7.1* 7.3*  MG 1.6  --   --   PHOS 3.6  --   --    Sepsis Markers  Recent Labs Lab 10/08/2013 2209 09/10/13 0347 09/10/13 0635  LATICACIDVEN 3.6* 2.01 1.7  PROCALCITON  --   --  3.65   ABG No results found for this basename: PHART, PCO2ART, PO2ART,  in the last 168 hours Liver Enzymes  Recent Labs Lab 10/04/2013 2209  AST 35  ALT 39*  ALKPHOS 578*  BILITOT 0.6  ALBUMIN 1.8*   Cardiac Enzymes  Recent Labs Lab 09/10/13 0635 09/10/13 1040 09/10/13 1830  TROPONINI <0.30 <0.30 <0.30   Glucose  Recent Labs Lab 09/11/13 1141  GLUCAP 76    Imaging Nm Hepatobiliary  09/10/2013   CLINICAL DATA:  History of metastatic cholangiocarcinoma, post percutaneous biliary drainage catheter placement and subsequent internal biliary stent placement. Patient now presents with weakness and shock with recent CT imaging demonstrating persistent gallbladder wall thickening and pericholecystic fluid, likely progressed since recent prior examinations. Please perform HIDA scan for further evaluation.  EXAM: NUCLEAR MEDICINE HEPATOBILIARY IMAGING  TECHNIQUE: Sequential images of the abdomen were obtained out to 60 minutes following intravenous administration of radiopharmaceutical.  COMPARISON:  CT ABD/PELVIS W CM dated 09/10/2013; CT ABD/PELVIS W CM dated 09/03/2013; CT ABD/PELVIS W CM dated 06/28/2013  RADIOPHARMACEUTICALS:  4.9 mCi Tc-35m Choletec  FINDINGS: There is relative homogeneous distribution of injected radiotracer throughout the hepatic parenchyma.  There is early excretion of injected radiotracer through the internal biliary stent was opacification of the proximal small bowel, initially seen on the 20 min anterior projection planar image.  Despite imaging for a total of 2 hr, the gallbladder was never definitively identified.  IMPRESSION: 1. Findings worrisome for cystic duct obstruction / acute  cholecystitis. 2. Internal biliary stent appears widely patent.  PLAN: Potential placement of percutaneous cholecystostomy tube was discussed with the patient, however given the patient's advanced metastatic disease, she wishes to consider delay cholecystostomy tube placement as she is more interested in pain management rather then additional interventions at the present time.  Above findings discussed with Dr. Benay Spice at the time of procedure completion.   Electronically Signed   By: Sandi Mariscal M.D.   On: 09/10/2013 15:40     CXR:   ASSESSMENT / PLAN:  PULMONARY A: Right > left pleural effusion  No sig dyspnea  P:   Wean FIO2 Supplemental O2 as needed.  Could consider sampling pleural fluid at some point in future if a clear indication to do so. At this point I would defer  CARDIOVASCULAR A:  Severe sepsis/septic vs hypovolemic shock       Sinus tachy P:  IV hydration  Ck  cortisol > 9.1 neo synephrine for SBP >100  solucortef 50mg  q 6 hours.  Plan for now will be to continue IV abx to see if she responds without invasive procedures.  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 She is s/p bile duct stent 07/15/13 and placement of external biliary drain above metal stent 1/22 P:   Clear liquids   Appreciate IR's assistance. Nothing clinically to suggest acute chole or malfunctioning biliary drain   HEMATOLOGIC/ ONC A:   Mild anemia H/o DVT-->On LMWH P:  Cont LMWH  Trend cbc     INFECTIOUS A:  Severe sepsis/septic shock.  >consider cholecystitis superimposed on chronically obstructed GB from her Cholangiocarcinoma. Hida scan supports this dx P:   F/u cultures Continue vanco + pip/tazo Hida > Findings worrisome for cystic duct obstruction / acute cholecystitis. At this time pt wants to defer a perc drain   ENDOCRINE A:  No acute  P:   Trend cbg Ck cortisol > pending  NEUROLOGIC A:  Chronic cancer pain.  She is in terrible pain. All chronic. This includes her neck, arm spine and sacrum P:   Supportive care  Placed on dilaudid drip per oncology.   GLOBAL / GOALS OF CARE:  Believe we will need to seriously address goals of care with patient with assistance and participation of Dr Benay Spice. We are aggressively treating pain but also using life support measures. She would like to defer cholecystostomy she doesn't turn around with Abx then we may need to acknowledge that the narcotics she is requiring are driving her hypotension. I explained this to pt and husband today 3/4 and they understand that withdrawal of the pressors is a possible outcome here.   TODAY'S SUMMARY:  4/4 she is able to maintain adequate blood pressure without neo at this time. MAPs are in the 70's on 0.5 mg/hr of dilaudid gtt, it was off for some time then restarted so will continue to monitor. Plan to discuss goals of care in near future.    Georgann Housekeeper, ACNP [facility] Pulmonology/Critical Care Pager [phone] or [phone]  Baltazar Apo, MD, PhD 09/12/2013, 11:22 AM [facility] [phone] or if no answer [phone]

## 2013-09-12 NOTE — Progress Notes (Signed)
IP PROGRESS NOTE  Subjective:  She is more alert this morning. The Dilaudid drip was stopped. She reports bilateral leg weakness. Her family is at the bedside.   Objective: Vital signs in last 24 hours: Blood pressure 115/77, pulse 125, temperature 97 F (36.1 C), temperature source Axillary, resp. rate 19, height 5\' 3"  (1.6 m), weight 134 lb 7.7 oz (61 kg), SpO2 96.00%.  Intake/Output from previous day: 03/04 0701 - 03/05 0700 In: 5215 [P.O.:360; I.V.:4505; IV Piggyback:350] Out: 400 [Urine:400]  Physical Exam: HEENT: No thrush Lungs: Increased respiratory rate, clear anteriorly Cardiac: Regular rate and rhythm, tachycardia Abdomen: Tender in the right mid abdomen, no mass, no hepatomegaly, hypoactive bowel sounds Extremities: Trace low leg edema bilaterally Neurologic: Alert and oriented, the foot strength appears intact bilaterally, she can flex and extend both knees. Weakness with hip flexion bilaterally  Portacath/PICC-without erythema  Lab Results:  Recent Labs  09/11/13 0347 09/12/13 0500  WBC 13.5* 7.8  HGB 11.3* 10.1*  HCT 32.9* 28.9*  PLT 124* 70*    BMET  Recent Labs  09/11/13 0347 09/12/13 0500  NA 123* 125*  K 3.9 3.3*  CL 93* 96  CO2 13* 14*  GLUCOSE 53* 108*  BUN 10 15  CREATININE 0.47* 0.47*  CALCIUM 7.1* 7.3*    Studies/Results: Nm Hepatobiliary  09/10/2013   CLINICAL DATA:  History of metastatic cholangiocarcinoma, post percutaneous biliary drainage catheter placement and subsequent internal biliary stent placement. Patient now presents with weakness and shock with recent CT imaging demonstrating persistent gallbladder wall thickening and pericholecystic fluid, likely progressed since recent prior examinations. Please perform HIDA scan for further evaluation.  EXAM: NUCLEAR MEDICINE HEPATOBILIARY IMAGING  TECHNIQUE: Sequential images of the abdomen were obtained out to 60 minutes following intravenous administration of radiopharmaceutical.   COMPARISON:  CT ABD/PELVIS W CM dated 09/10/2013; CT ABD/PELVIS W CM dated 09/03/2013; CT ABD/PELVIS W CM dated 06/28/2013  RADIOPHARMACEUTICALS:  4.9 mCi Tc-81m Choletec  FINDINGS: There is relative homogeneous distribution of injected radiotracer throughout the hepatic parenchyma.  There is early excretion of injected radiotracer through the internal biliary stent was opacification of the proximal small bowel, initially seen on the 20 min anterior projection planar image.  Despite imaging for a total of 2 hr, the gallbladder was never definitively identified.  IMPRESSION: 1. Findings worrisome for cystic duct obstruction / acute cholecystitis. 2. Internal biliary stent appears widely patent.  PLAN: Potential placement of percutaneous cholecystostomy tube was discussed with the patient, however given the patient's advanced metastatic disease, she wishes to consider delay cholecystostomy tube placement as she is more interested in pain management rather then additional interventions at the present time.  Above findings discussed with Dr. Benay Spice at the time of procedure completion.   Electronically Signed   By: Sandi Mariscal M.D.   On: 09/10/2013 15:40    Medications: I have reviewed the patient's current medications.  Assessment/Plan: 1. Metastatic cholangiocarcinoma with extensive bone and liver metastases, was scheduled to begin palliative capecitabine this week  2. History of obstructive jaundice secondary to bile duct tumor, external biliary drain remains in place with a bile duct stent placed 07/15/2013.  3. CT/nuclear medicine evidence of cholecystitis 09/10/2013  4. Pain secondary to extensive bone metastases including a destructive mass at L3 causing radicular pain in the right leg. She completed palliative radiation to the neck 09/06/2013  5. Admission with sepsis syndrome 06/28/2013, cultures negative, likely cholangitis, now off of pressors.  6. Left lower extremity DVT 07/10/2013-maintained  on  Lovenox  7. History of sacral and thoracic decubitus ulcers  8. History of hypercalcemia of malignancy, status post Zometa 06/28/2013  9. Admission 09/10/2013 with sepsis syndrome-now maintained on broad-spectrum intravenous antibiotics   Bianca Logan is more alert this morning. She remains critically ill, but she is maintaining an adequate blood pressure off of pressors. Bianca Logan and her family would like to avoid a cholecystotomy tube if possible. They understand her poor overall prognosis. I discussed the prognosis and goals of care with her husband. He understands her prognosis. We discussed home hospice care and [facility].   Recommendations: 1. Continue antibiotics and fluids per critical care medicine 2. continue Dilaudid as needed for pain 3. Foley catheter for comfort 4. If she recovers from the acute septic episode the plan will be home hospice care. I will return on 09/13/2013 to discuss hospice care with Bianca Logan and her family.     LOS: 3 days   Tharun Cappella  09/12/2013, 2:16 PM

## 2013-09-13 ENCOUNTER — Encounter (HOSPITAL_COMMUNITY): Payer: Self-pay | Admitting: Radiology

## 2013-09-13 DIAGNOSIS — C23 Malignant neoplasm of gallbladder: Secondary | ICD-10-CM

## 2013-09-13 DIAGNOSIS — D696 Thrombocytopenia, unspecified: Secondary | ICD-10-CM

## 2013-09-13 LAB — BASIC METABOLIC PANEL
BUN: 15 mg/dL (ref 6–23)
CALCIUM: 7.6 mg/dL — AB (ref 8.4–10.5)
CO2: 14 mEq/L — ABNORMAL LOW (ref 19–32)
Chloride: 98 mEq/L (ref 96–112)
Creatinine, Ser: 0.44 mg/dL — ABNORMAL LOW (ref 0.50–1.10)
GFR calc Af Amer: >90 mL/min (ref >90)
Glucose, Bld: 65 mg/dL — ABNORMAL LOW (ref 70–99)
Potassium: 3.8 mEq/L (ref 3.7–5.3)
SODIUM: 127 meq/L — AB (ref 137–147)

## 2013-09-13 LAB — CBC
HCT: 27.3 % — ABNORMAL LOW (ref 36.0–46.0)
Hemoglobin: 9.6 g/dL — ABNORMAL LOW (ref 12.0–15.0)
MCH: 27.1 pg (ref 26.0–34.0)
MCHC: 35.2 g/dL (ref 30.0–36.0)
MCV: 77.1 fL — ABNORMAL LOW (ref 78.0–100.0)
PLATELETS: 50 10*3/uL — AB (ref 150–400)
RBC: 3.54 MIL/uL — ABNORMAL LOW (ref 3.87–5.11)
RDW: 16.9 % — AB (ref 11.5–15.5)
WBC: 4.5 10*3/uL (ref 4.0–10.5)

## 2013-09-13 LAB — PROTIME-INR
INR: 1.14 (ref 0.00–1.49)
PROTHROMBIN TIME: 14.4 s (ref 11.6–15.2)

## 2013-09-13 MED ORDER — PHENOL 1.4 % MT LIQD
1.0000 | OROMUCOSAL | Status: DC | PRN
  Filled 2013-09-13: qty 177

## 2013-09-13 MED ORDER — MAGIC MOUTHWASH
10.0000 mL | Freq: Three times a day (TID) | ORAL | Status: DC
  Administered 2013-09-13 – 2013-09-14 (×5): 10 mL via ORAL
  Administered 2013-09-15: 5 mL via ORAL
  Administered 2013-09-15 (×2): 10 mL via ORAL
  Filled 2013-09-13 (×12): qty 10

## 2013-09-13 MED ORDER — ENOXAPARIN SODIUM 80 MG/0.8ML ~~LOC~~ SOLN
70.0000 mg | SUBCUTANEOUS | Status: DC
  Filled 2013-09-13: qty 0.8

## 2013-09-13 NOTE — Progress Notes (Addendum)
PULMONARY / CRITICAL CARE MEDICINE  Name: Bianca Logan MRN: [id number] DOB: [birth year]    ADMISSION DATE:  10/01/2013  REFERRING MD :  EDP PRIMARY SERVICE: PCCM   CHIEF COMPLAINT:  Shock  BRIEF PATIENT DESCRIPTION:  63 yo with metastatic cholangiocarcinoma s/p bile duct stent placed 1/5 and external biliary drain replaced 1/22 admitted 3/2 with sepsis.     SIGNIFICANT EVENTS / STUDIES:  3/3  CT abdomen >>> New gallbladder wall thickening and pericholecystic collections, scites, pulmonary edema,  large pulmonary effusions, pelvic peritoneal inflammation or tumor spread, mild distal colitis, widespread cholangiocarcinoma 3/3   HIDA scan >>> Findings worrisome for cystic duct obstruction / acute cholecystitis. 3/7   IR >>> Percutaneous biliary drain placement   LINES / TUBES: Right Mediport >>  CULTURES: 3/2  Blood >>> 3/2  Urine >>> neg  ANTIBIOTICS: Vancomycin 3/2 >>> Zosyn 3/2 >>>  INTERVAL HISTORY: Percutaneous biliary drain placement planned for this am.  Fair pain control while on Dilaudid gtt, but breakthrough pain with nursing care.  Family is concerned with depressed mental status.  RN reports decreased urine output.  VITAL SIGNS: Temp:  [96.8 F (36 C)-98.9 F (37.2 C)] 97.7 F (36.5 C) (03/07 0400) Pulse Rate:  [122-132] 126 (03/07 0600) Resp:  [26-43] 33 (03/07 0600) BP: (101-125)/(65-78) 103/73 mmHg (03/07 0600) SpO2:  [92 %-99 %] 96 % (03/07 0600) Weight:  [66 kg (145 lb 8.1 oz)] 66 kg (145 lb 8.1 oz) (03/07 0500)  INTAKE / OUTPUT: Intake/Output     03/06 0701 - 03/07 0700 03/07 0701 - 03/08 0700   I.V. (mL/kg) 2898 (43.9)    IV Piggyback 450    Total Intake(mL/kg) 3348 (50.7)    Urine (mL/kg/hr) 835 (0.5)    Total Output 835     Net +2513          Stool Occurrence 1 x      PHYSICAL EXAMINATION: General:  Appears ill but comfortable Neuro:  Awake, answerers questions appropriately, speech is somewhat slurred HEENT:  Dry membranes Cardiovascular:   Tachycardic, regular Lungs:  Diminished bilateral entry Abdomen:  Soft, non tender Musculoskeletal:  Intact  Skin:  Sacral pressure wound  LABS:  CBC  Recent Labs Lab 09/12/13 0500 09/13/13 0435 09/14/13 0512  WBC 7.8 4.5 8.1  HGB 10.1* 9.6* 9.8*  HCT 28.9* 27.3* 29.4*  PLT 70* 50* 56*   Coag's  Recent Labs Lab 09/13/13 1136  INR 1.14   BMET  Recent Labs Lab 09/12/13 0500 09/12/13 1345 09/13/13 0435  NA 125* 124* 127*  K 3.3* 4.1 3.8  CL 96 95* 98  CO2 14* 14* 14*  BUN 15 14 15   CREATININE 0.47* 0.47* 0.44*  GLUCOSE 108* 79 65*   Electrolytes  Recent Labs Lab 09/10/13 0635  09/12/13 0500 09/12/13 1345 09/13/13 0435  CALCIUM 7.6*  < > 7.3* 7.6* 7.6*  MG 1.6  --   --   --   --   PHOS 3.6  --   --   --   --   < > = values in this interval not displayed. Sepsis Markers  Recent Labs Lab 09/30/2013 2209 09/10/13 0347 09/10/13 0635  LATICACIDVEN 3.6* 2.01 1.7  PROCALCITON  --   --  3.65   ABG No results found for this basename: PHART, PCO2ART, PO2ART,  in the last 168 hours  Liver Enzymes  Recent Labs Lab 09/12/2013 2209  AST 35  ALT 39*  ALKPHOS 578*  BILITOT 0.6  ALBUMIN  1.8*   Cardiac Enzymes  Recent Labs Lab 09/10/13 0635 09/10/13 1040 09/10/13 1830  TROPONINI <0.30 <0.30 <0.30   Glucose  Recent Labs Lab 09/11/13 1141  GLUCAP 76   Imaging No results found.  ASSESSMENT / PLAN:  PULMONARY A:  Bilateral pleural effusions Risk of CO2 retention with continuous opioid infusion DNI P:   Goal SpO2 Supplemental oxygen End-tidal CO2 monitoring for now, obviously can d/c if in full comfort care mode  CARDIOVASCULAR A:   Septic shock, resolving and lactate reassuring P:  Goal MAP > 60 D/c Neo-Synephrine gtt  RENAL A:   Dehydration  Metabolic acidosis Hyponatremia P:   Trend BMP Change IVF to D5 1/2NS @ 125 as hypoglycemia noted on MBP  GASTROINTESTINAL A:   Metastatic cholangiocarcinoma Oral ulcers GI Px is  not indicated Nutrition P:   Magic mouthwash, Chloraseptic spray Clear liquids as tolerated, but holding for now as IR procedure planned  HEMATOLOGIC  A:   Anemia Thrombocytopenia VTE Px P:  Trend CBC SCD  INFECTIOUS A:   Cholecystitis Possible cholangitis  P:   Abx / cxs as above Considering fungal coverage but white count reassuring and off pressors Percutaneous biliary drain placement today  ENDOCRINE A:   Adrenal insufficiency ( cortisol 9 in presence of shock ) Hypoglycemia noted on BMP P:   Stress dose steroids Start CBG monitoring q4h Change IVF as above  NEUROLOGIC A:   Cancer related pain P:   Dilaudid gtt ( 0.5 mg / h ) and boluses 0.5 mg q2h - this is goal for this morning, may increase if necessary D/c Fentanyl Palliative care    I have personally obtained history, examined patient, evaluated and interpreted laboratory and imaging results, reviewed medical records, formulated assessment / plan and placed orders.  Doree Fudge, MD Pulmonary and [place] Pager: [phone]  09/14/2013, 8:00 AM

## 2013-09-13 NOTE — Progress Notes (Signed)
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 She is s/p bile duct stent 07/15/13 and placement of external biliary drain above metal stent 1/22. Just Completed palliative XRT of C spine for bone mets. Now on every other week Xeloda. Admitted to ER at [facility] w / 1 week h/o worsening PO intake, weakness and fatigue. On arrival to ER her BP was in the 70s. Received several liters of crystalloid and although felt better BP remained in 80-90s after 4 liters. Bianca Logan was asked to eval      SIGNIFICANT EVENTS / STUDIES:  3/3 - CT chest/abd/pelvis: 1. Chronically obstructed gallbladder with new gallbladder wall thickening and pericholecystic collections, concerning for acute cholecystitis. 2. Worsening volume overload with new ascites, pulmonary edema, and larger pulmonary effusions. 3. Negative for pulmonary embolism. 4. Pelvic peritoneal inflammation or tumor spread. 5. Mild distal colitis. 6. Widespread cholangiocarcinoma, recently staged 09/03/2013.  3/3 - hida scan : Findings worrisome for cystic duct obstruction / acute  Cholecystitis. 3/4 - placed on dilaudid gtt and Neo 3/5 - off Neo  LINES / TUBES: Right port >>  CULTURES: BCX2 3/2>>> UC 3/2>>neg  ANTIBIOTICS: vanc 3/2>>> Zosyn 3/2>>>   SUBJECTIVE:    VITAL SIGNS: Temp:  [97 F (36.1 C)-98.6 F (37 C)] 97.7 F (36.5 C) (03/06 0400) Pulse Rate:  [112-136] 127 (03/06 0800) Resp:  [19-37] 26 (03/06 0800) BP: (95-138)/(63-92) 113/78 mmHg (03/06 0800) SpO2:  [95 %-100 %] 97 % (03/06 0800) Weight:  [140 lb 14 oz (63.9 kg)] 140 lb 14 oz (63.9 kg) (03/06 0600)  INTAKE / OUTPUT: Intake/Output     03/05 0701 - 03/06 0700 03/06 0701 - 03/07 0700   P.O.     I.V.  (mL/kg) 2523.1 (39.5) 126 (2)   IV Piggyback 750 50   Total Intake(mL/kg) 3273.1 (51.2) 176 (2.8)   Urine (mL/kg/hr) 1400 (0.9)    Total Output 1400     Net +1873.1 +176          PHYSICAL EXAMINATION: General:  Chronically ill appearing white female, NAD, pain better  Neuro:  Awake, non-focal, no def  HEENT:  [place]. No JVD MM dry  Cardiovascular:  Tachy rrr  Lungs:  Decreased  Abdomen:  Perc drain w/ dressing intact. Abd soft  Musculoskeletal:  Intact  Skin:  Sacral decub dressed   LABS:  CBC  Recent Labs Lab 09/11/13 0347 09/12/13 0500 09/13/13 0435  WBC 13.5* 7.8 4.5  HGB 11.3* 10.1* 9.6*  HCT 32.9* 28.9* 27.3*  PLT 124* 70* 50*   Coag's No results found for this basename: APTT, INR,  in the last 168 hours BMET  Recent Labs Lab 09/12/13 0500 09/12/13 1345 09/13/13 0435  NA 125* 124* 127*  K 3.3* 4.1 3.8  CL 96 95* 98  CO2 14* 14* 14*  BUN 15 14 15   CREATININE 0.47* 0.47* 0.44*  GLUCOSE 108* 79 65*   Electrolytes  Recent Labs Lab 09/10/13 0635  09/12/13 0500 09/12/13 1345 09/13/13 0435  CALCIUM 7.6*  < > 7.3* 7.6* 7.6*  MG 1.6  --   --   --   --   PHOS 3.6  --   --   --   --   < > =  values in this interval not displayed. Sepsis Markers  Recent Labs Lab 09/27/2013 2209 09/10/13 0347 09/10/13 0635  LATICACIDVEN 3.6* 2.01 1.7  PROCALCITON  --   --  3.65   ABG No results found for this basename: PHART, PCO2ART, PO2ART,  in the last 168 hours Liver Enzymes  Recent Labs Lab 09/15/2013 2209  AST 35  ALT 39*  ALKPHOS 578*  BILITOT 0.6  ALBUMIN 1.8*   Cardiac Enzymes  Recent Labs Lab 09/10/13 0635 09/10/13 1040 09/10/13 1830  TROPONINI <0.30 <0.30 <0.30   Glucose  Recent Labs Lab 09/11/13 1141  GLUCAP 76    Imaging No results found.   ASSESSMENT / PLAN:  PULMONARY A:  Right > left pleural effusion  Dyspnea - mild, patient reports tolerable  P:   Wean FIO2 Supplemental O2 as needed.  Defer pleural sampling for  now   CARDIOVASCULAR A:   Severe sepsis/septic vs hypovolemic shock  Sinus tachycardia P:  IV hydration  Solucortef 50mg  q 6 hours, cortisol 9.1 on 3/3 Plan for now will be to continue IV abx to see if she responds without invasive procedures. Pt ok for restart of pressors if warranted (she indicates it would depend on the situation)  RENAL A:   Dehydration  Metabolic acidosis + AG, lactic acid component > improved  Hyponatremia - urine osm shows a dilute urine Hypokalemia P:   IVFs  Replete K Follow BMP  GASTROINTESTINAL A:   Metastatic Cholangiocarcinoma - stage IV w/ mets to liver, abd/retroperitoneal adenopathy, and bone (spine and rib). She is s/p bile duct stent 07/15/13 and placement of external biliary drain above metal stent 1/22 Oral Ulcerations P:   Clear liquids as tolerated Appreciate IR's assistance. Nothing clinically to suggest acute chole or malfunctioning biliary drain Magic Mouthwash, chloraseptic spray   HEMATOLOGIC/ ONC A:   Mild anemia H/o DVT-->On LMWH Thrombocytopenia - platelet drop, 3/4 124-->3/6 50 P:  Cont LMWH  Trend cbc    INFECTIOUS A:   Severe sepsis/septic shock - consider cholecystitis superimposed on chronically obstructed GB from her Cholangiocarcinoma. Hida scan supports this dx P:   F/u cultures Continue vanco + pip/tazo Hida > Findings worrisome for cystic duct obstruction / acute cholecystitis. At this time pt wants to defer a perc drain   ENDOCRINE A:   Adrenal Insufficiency  P:   Trend cbg Stress steroids as above  NEUROLOGIC A:   Pain - in setting of metastatic cholangiocarcinoma P:   Supportive care  Movement causes significant pain Placed on dilaudid drip per oncology.  Palliative Care Consult for pain management and ongoing goals of care discussion.    GLOBAL / GOALS OF CARE:  Ongoing discussion regarding goals of care with the assistance of Bianca Logan.  Patient's ultimate goal is to stabilize to the  point that she could potentially go to [employer] or home (they are discussing which option they want to pursue) and will follow up with Bianca Logan afternoon of 3/6.   Bianca Gens, NP-C [place] [facility] Pgr: [phone] or [phone]  Bianca Apo, MD, PhD 09/13/2013, 12:27 PM [place] [facility] [phone] or if no answer [phone]

## 2013-09-13 NOTE — Progress Notes (Signed)
IP PROGRESS NOTE  Subjective:   Her pain is controlled on the low-dose Dilaudid drip. She complains of a sore throat. She has nose bleeding this morning.  Objective: Vital signs in last 24 hours: Blood pressure 114/72, pulse 130, temperature 98.2 F (36.8 C), temperature source Axillary, resp. rate 39, height 5\' 3"  (1.6 m), weight 140 lb 14 oz (63.9 kg), SpO2 97.00%.  Intake/Output from previous day: 03/05 0701 - 03/06 0700 In: 3273.1 [I.V.:2523.1; IV Piggyback:750] Out: 1400 [Urine:1400]  Physical Exam: HEENT: No thrush,? Early ulceration at the soft palate Lungs: Increased respiratory rate, clear anteriorly Cardiac: Regular rate and rhythm, tachycardia Abdomen: Tender in the right mid abdomen, no mass, no hepatomegaly Extremities: Trace low leg edema bilaterally Neurologic: Alert and oriented, the foot strength appears intact bilaterally  Portacath/PICC-without erythema  Lab Results:  Recent Labs  09/12/13 0500 09/13/13 0435  WBC 7.8 4.5  HGB 10.1* 9.6*  HCT 28.9* 27.3*  PLT 70* 50*    BMET  Recent Labs  09/12/13 1345 09/13/13 0435  NA 124* 127*  K 4.1 3.8  CL 95* 98  CO2 14* 14*  GLUCOSE 79 65*  BUN 14 15  CREATININE 0.47* 0.44*  CALCIUM 7.6* 7.6*    Studies/Results: No results found.  Medications: I have reviewed the patient's current medications.  Assessment/Plan: 1. Metastatic cholangiocarcinoma with extensive bone and liver metastases, was scheduled to begin palliative capecitabine 09/15/2013  2. History of obstructive jaundice secondary to bile duct tumor, external biliary drain remains in place with a bile duct stent placed 07/15/2013.  3. CT/nuclear medicine evidence of cholecystitis 09/10/2013  4. Pain secondary to extensive bone metastases including a destructive mass at L3 causing radicular pain in the right leg. She completed palliative radiation to the neck 09/06/2013  5. Admission with sepsis syndrome 06/28/2013, cultures negative,  likely cholangitis, now off of pressors.  6. Left lower extremity DVT 07/10/2013-maintained on Lovenox  7. History of sacral and thoracic decubitus ulcers  8. History of hypercalcemia of malignancy, status post Zometa 06/28/2013  9. Admission 09/10/2013 with sepsis syndrome-now maintained on broad-spectrum intravenous antibiotics  10. Sore throat-she appears to have mucositis from C-spine radiation  11. Thrombocytopenia secondary to sepsis   Ms. Wissink appears stable. She continues to have signs of sepsis. I discussed the current situation and overall prognosis with Mr. Cizek and her family. Her goal is to get out of the ICU setting with hospice care. We discussed the [facility] hospice program. She would like to arrange for a transfer to [facility] if her condition stabilizes.  I discussed the case with Dr. Lamonte Sakai and Dr. Laurence Ferrari. They feel there is a significant chance the sepsis may improve with drainage of the gallbladder. She agrees to this procedure with the hope that it may allow for her to get out of the hospital for a period of time.   Recommendations: 1. Continue antibiotics and fluids per critical care medicine 2. continue Dilaudid as needed for pain 3. cholecystostomy per interventional radiology 4. hold Lovenox with the thrombocytopenia and planned intervention 5. [facility] hospice referral for transfer to [facility] when appropriate  I will be out until 09/23/2013. Oncology will continue following her in the hospital.     LOS: 4 days   [facility]  09/13/2013, 2:33 PM

## 2013-09-13 NOTE — Progress Notes (Signed)
Subjective: Patient states her RUQ pain today is 10/10 and complains of nausea.   Objective: Physical Exam: BP 115/78  Pulse 126  Temp(Src) 98 F (36.7 C) (Axillary)  Resp 39  Ht 5\' 3"  (1.6 m)  Wt 140 lb 14 oz (63.9 kg)  BMI 24.96 kg/m2  SpO2 97%  General: A&Ox3, appears weak. Abd: tenderness RUQ  Labs: CBC  Recent Labs  09/12/13 0500 09/13/13 0435  WBC 7.8 4.5  HGB 10.1* 9.6*  HCT 28.9* 27.3*  PLT 70* 50*   BMET  Recent Labs  09/12/13 1345 09/13/13 0435  NA 124* 127*  K 4.1 3.8  CL 95* 98  CO2 14* 14*  GLUCOSE 79 65*  BUN 14 15  CREATININE 0.47* 0.44*  CALCIUM 7.6* 7.6*   LFT No results found for this basename: PROT, ALBUMIN, AST, ALT, ALKPHOS, BILITOT, BILIDIR, IBILI, LIPASE,  in the last 72 hours PT/INR No results found for this basename: LABPROT, INR,  in the last 72 hours   Studies/Results: No results found.  Assessment/Plan: Sepsis, stable, on IV antibiotics, off pressors. Metastatic cholangiocarcinoma with biliary stent and capped external drain.  Ascites, plan for image guided paracentesis 3/7 prior to IR procedure. Acute cholecystitis, discussion with Dr. Benay Spice, CCM, Dr. Laurence Ferrari, patient and the family today, all in agreement to proceed with percutaneous cholecystostomy tube placement in hopes of pain and fever relief, plans for hospice once stable. The patient did receive sq lovenox 70mg  today, will hold this 3/7 and plan for IR procedure 3/7, patient will be NPO after midnight. Thrombocytopenia, Dr. Kathlene Cote aware, ok to proceed.  Risks and Benefits discussed with the patient. All of the patient's questions were answered, patient is agreeable to proceed. Consent signed and in chart.   LOS: 4 days    Rockney Ghee 09/13/2013 12:32 PM

## 2013-09-13 NOTE — Progress Notes (Signed)
Clinical Social Work Department BRIEF PSYCHOSOCIAL ASSESSMENT 09/13/2013  Patient:  Bianca Logan, Bianca Logan     Account Number:  [id number]     Admit date:  10/08/2013  Clinical Social Worker:  Ulyess Blossom  Date/Time:  09/13/2013 04:30 PM  Referred by:  Physician  Date Referred:  09/13/2013 Referred for  Residential hospice placement   Other Referral:   Interview type:  Patient Other interview type:   and patient family at bedside    PSYCHOSOCIAL DATA Living Status:  [place] Admitted from facility:   Level of care:   Primary support name:  Cameshia Cressman Primary support relationship to patient:  SPOUSE Degree of support available:   strong    CURRENT CONCERNS Current Concerns  Post-Acute Placement   Other Concerns:    SOCIAL WORK ASSESSMENT / PLAN CSW received referral for residential hospice placement.    CSW met with pt and pt spouse and pt daughters at bedside. CSW introduced self and explained role. CSW discussed referral for residential hospice placement. CSW provided support to pt and pt family as they asked questions surrounding residential hospice and the process. CSW clarified questions and concerns. CSW provided residential hospice list and pt and pt family would like [employer] [facility]. Per pt and pt family, Dr. Benay Spice has contacted [facility] regarding pt.    Pt and pt spouse discussed that pt is having procedure tomorrow that will hopefully assist in making pt more comfortable. Pt was initially hopeful to return home, but at this time feels that residential hospice will be the best setting to meet her needs and ensure her comfort.    CSW contacted [employer], Erling Conte and made referral.    CSW to continue to follow to assist with residential hospice placement.   Assessment/plan status:  Psychosocial Support/Ongoing Assessment of Needs Other assessment/ plan:   discharge planning   Information/referral to community resources:    Residential hospice list    PATIENT'S/FAMILY'S RESPONSE TO PLAN OF CARE: Pt alert and oriented x 4. Pt has strong family support. Pt and pt family were grateful for CSW visit and support. Pt expressed her goal is to remain comfortable and the doctors are currently working to ensure she is comfortable before she is stable for transition to residential hospice.    Alison Murray, MSW, [place] [facility] [phone]

## 2013-09-14 ENCOUNTER — Inpatient Hospital Stay (HOSPITAL_COMMUNITY): Payer: BC Managed Care – PPO

## 2013-09-14 DIAGNOSIS — C419 Malignant neoplasm of bone and articular cartilage, unspecified: Secondary | ICD-10-CM

## 2013-09-14 DIAGNOSIS — E860 Dehydration: Secondary | ICD-10-CM

## 2013-09-14 LAB — CBC
HCT: 29.4 % — ABNORMAL LOW (ref 36.0–46.0)
Hemoglobin: 9.8 g/dL — ABNORMAL LOW (ref 12.0–15.0)
MCH: 26.3 pg (ref 26.0–34.0)
MCHC: 33.3 g/dL (ref 30.0–36.0)
MCV: 78.8 fL (ref 78.0–100.0)
Platelets: 56 K/uL — ABNORMAL LOW (ref 150–400)
RBC: 3.73 MIL/uL — ABNORMAL LOW (ref 3.87–5.11)
RDW: 17.5 % — ABNORMAL HIGH (ref 11.5–15.5)
WBC: 8.1 K/uL (ref 4.0–10.5)

## 2013-09-14 LAB — GLUCOSE, CAPILLARY
Glucose-Capillary: 71 mg/dL (ref 70–99)
Glucose-Capillary: 93 mg/dL (ref 70–99)
Glucose-Capillary: 116 mg/dL — ABNORMAL HIGH (ref 70–99)

## 2013-09-14 LAB — OCCULT BLOOD X 1 CARD TO LAB, STOOL: Fecal Occult Bld: NEGATIVE

## 2013-09-14 LAB — VANCOMYCIN, TROUGH: Vancomycin Tr: 21.2 ug/mL — ABNORMAL HIGH (ref 10.0–20.0)

## 2013-09-14 MED ORDER — LIDOCAINE HCL 1 % IJ SOLN
INTRAMUSCULAR | Status: AC
  Filled 2013-09-14: qty 20

## 2013-09-14 MED ORDER — FENTANYL CITRATE 0.05 MG/ML IJ SOLN
INTRAMUSCULAR | Status: AC
  Filled 2013-09-14: qty 6

## 2013-09-14 MED ORDER — IOHEXOL 300 MG/ML  SOLN: 10.0000 mL | Freq: Once | INTRAMUSCULAR | Status: AC | PRN

## 2013-09-14 MED ORDER — FENTANYL CITRATE 0.05 MG/ML IJ SOLN
INTRAMUSCULAR | Status: AC | PRN
  Administered 2013-09-14: 25 ug via INTRAVENOUS

## 2013-09-14 MED ORDER — LIP MEDEX EX OINT
TOPICAL_OINTMENT | CUTANEOUS | Status: DC | PRN
  Filled 2013-09-14: qty 7

## 2013-09-14 MED ORDER — DEXTROSE-NACL 5-0.45 % IV SOLN
INTRAVENOUS | Status: DC
  Administered 2013-09-14: 1000 mL via INTRAVENOUS
  Administered 2013-09-14 – 2013-09-15 (×2): via INTRAVENOUS
  Administered 2013-09-15: 1000 mL via INTRAVENOUS
  Administered 2013-09-15: 05:00:00 via INTRAVENOUS

## 2013-09-14 MED ORDER — MIDAZOLAM HCL 2 MG/2ML IJ SOLN
INTRAMUSCULAR | Status: AC
  Filled 2013-09-14: qty 6

## 2013-09-14 NOTE — Procedures (Signed)
Procedure:  Perc cholecystostomy Findings:  Overt pus in GB lumen.  10 Fr drain placed and connected to gravity bag. Will follow.

## 2013-09-14 NOTE — Sedation Documentation (Addendum)
During procedure the monitor was unable to measure an NIBP despite troubleshooting. The pt was draped and prepped is a sterile fashion and was not able to measure a manual BP d/t risk of breaking sterility.  Pt was alert, responsive, pink and other VS were unchanged. I monitored pt closely for signs of hypoprofusion.  Was able to measure BP at end of procedure after multiple attempts. Above reported to Brynda Greathouse, RN caring for Bianca Logan in Rm 1222.

## 2013-09-14 NOTE — Progress Notes (Signed)
Chart Note I stopped by the patient his room today but she was down stairs for procedure. I spoke to the critical care team Dr. Oliver Pila and he assured me that the patient is a very stable. I would see her tomorrow to make sure she is comfortable and answer any questions that her family may have.

## 2013-09-15 DIAGNOSIS — R5381 Other malaise: Secondary | ICD-10-CM

## 2013-09-15 DIAGNOSIS — C7951 Secondary malignant neoplasm of bone: Secondary | ICD-10-CM

## 2013-09-15 DIAGNOSIS — D6959 Other secondary thrombocytopenia: Secondary | ICD-10-CM

## 2013-09-15 DIAGNOSIS — K121 Other forms of stomatitis: Secondary | ICD-10-CM

## 2013-09-15 DIAGNOSIS — C7952 Secondary malignant neoplasm of bone marrow: Secondary | ICD-10-CM

## 2013-09-15 DIAGNOSIS — I82409 Acute embolism and thrombosis of unspecified deep veins of unspecified lower extremity: Secondary | ICD-10-CM

## 2013-09-15 DIAGNOSIS — G893 Neoplasm related pain (acute) (chronic): Secondary | ICD-10-CM

## 2013-09-15 DIAGNOSIS — E43 Unspecified severe protein-calorie malnutrition: Secondary | ICD-10-CM

## 2013-09-15 DIAGNOSIS — R5383 Other fatigue: Secondary | ICD-10-CM

## 2013-09-15 DIAGNOSIS — C221 Intrahepatic bile duct carcinoma: Principal | ICD-10-CM

## 2013-09-15 DIAGNOSIS — C787 Secondary malignant neoplasm of liver and intrahepatic bile duct: Secondary | ICD-10-CM

## 2013-09-15 DIAGNOSIS — K1230 Oral mucositis (ulcerative), unspecified: Secondary | ICD-10-CM

## 2013-09-15 LAB — GLUCOSE, CAPILLARY
GLUCOSE-CAPILLARY: 158 mg/dL — AB (ref 70–99)
Glucose-Capillary: 125 mg/dL — ABNORMAL HIGH (ref 70–99)
Glucose-Capillary: 145 mg/dL — ABNORMAL HIGH (ref 70–99)
Glucose-Capillary: 152 mg/dL — ABNORMAL HIGH (ref 70–99)
Glucose-Capillary: 123 mg/dL — ABNORMAL HIGH (ref 70–99)
Glucose-Capillary: 156 mg/dL — ABNORMAL HIGH (ref 70–99)
Glucose-Capillary: 148 mg/dL — ABNORMAL HIGH (ref 70–99)

## 2013-09-15 LAB — CBC
HEMATOCRIT: 30.1 % — AB (ref 36.0–46.0)
HEMOGLOBIN: 10.3 g/dL — AB (ref 12.0–15.0)
MCH: 27 pg (ref 26.0–34.0)
MCHC: 34.2 g/dL (ref 30.0–36.0)
MCV: 78.8 fL (ref 78.0–100.0)
Platelets: 44 10*3/uL — ABNORMAL LOW (ref 150–400)
RBC: 3.82 MIL/uL — ABNORMAL LOW (ref 3.87–5.11)
RDW: 17.6 % — ABNORMAL HIGH (ref 11.5–15.5)
WBC: 6.3 10*3/uL (ref 4.0–10.5)

## 2013-09-15 LAB — BASIC METABOLIC PANEL
BUN: 16 mg/dL (ref 6–23)
CALCIUM: 9.1 mg/dL (ref 8.4–10.5)
CO2: 16 mEq/L — ABNORMAL LOW (ref 19–32)
CREATININE: 0.42 mg/dL — AB (ref 0.50–1.10)
Chloride: 102 mEq/L (ref 96–112)
GFR calc Af Amer: >90 mL/min (ref >90)
GLUCOSE: 165 mg/dL — AB (ref 70–99)
Potassium: 3.7 mEq/L (ref 3.7–5.3)
SODIUM: 130 meq/L — AB (ref 137–147)

## 2013-09-15 MED ORDER — VANCOMYCIN HCL 10 G IV SOLR
1250.0000 mg | INTRAVENOUS | Status: DC
  Administered 2013-09-15: 1250 mg via INTRAVENOUS
  Filled 2013-09-15: qty 1250

## 2013-09-15 NOTE — Progress Notes (Signed)
PULMONARY / CRITICAL CARE MEDICINE  Name: Bianca Logan MRN: [id number] DOB: 1951/04/18    ADMISSION DATE:  09/13/2013  REFERRING MD :  EDP PRIMARY SERVICE: PCCM   CHIEF COMPLAINT:  Shock  BRIEF PATIENT DESCRIPTION:  63 yo with metastatic cholangiocarcinoma s/p bile duct stent placed 1/5 and external biliary drain replaced 1/22 admitted 3/2 with sepsis.     SIGNIFICANT EVENTS / STUDIES:  3/3  CT abdomen >>> New gallbladder wall thickening and pericholecystic collections, scites, pulmonary edema,  large pulmonary effusions, pelvic peritoneal inflammation or tumor spread, mild distal colitis, widespread cholangiocarcinoma 3/3   HIDA scan >>> Findings worrisome for cystic duct obstruction / acute cholecystitis. 3/7   IR >>> Percutaneous cholecystectomy  LINES / TUBES: Right Mediport >>  CULTURES: 3/3  MRSA PCR >>> neg 3/2  Blood >>> 3/2  Urine >>> neg  ANTIBIOTICS: Vancomycin 3/2 >>> 3/8 Zosyn 3/2 >>>  INTERVAL HISTORY: Reports fair pain control.  Family has not been in yet.  VITAL SIGNS: Temp:  [97.6 F (36.4 C)-98.9 F (37.2 C)] 97.7 F (36.5 C) (03/08 0400) Pulse Rate:  [36-141] 36 (03/08 0600) Resp:  [23-45] 34 (03/08 0600) BP: (83-124)/(40-82) 111/74 mmHg (03/08 0600) SpO2:  [93 %-100 %] 100 % (03/08 0600) Weight:  [62.9 kg (138 lb 10.7 oz)-63.8 kg (140 lb 10.5 oz)] 62.9 kg (138 lb 10.7 oz) (03/08 0500)  INTAKE / OUTPUT: Intake/Output     03/07 0701 - 03/08 0700 03/08 0701 - 03/09 0700   I.V. (mL/kg) 2774.2 (44.1)    IV Piggyback 437.5    Total Intake(mL/kg) 3211.7 (51.1)    Urine (mL/kg/hr) 655 (0.4)    Drains 2925 (1.9)    Total Output 3580     Net -368.3          Stool Occurrence 2 x      PHYSICAL EXAMINATION: General:  Appears ill, but in no distress Neuro:  Sleepy, but arouses to voice HEENT:  Dry membranes Cardiovascular:  Tachycardic, regular Lungs:  Diminished bilateral entry, somewhat shallow respirations Abdomen:  Soft, mildly tender, drain  site clean Musculoskeletal:  Intact  Skin:  Sacral pressure wound  LABS:  CBC  Recent Labs Lab 09/13/13 0435 09/14/13 0512 09/15/13 0520  WBC 4.5 8.1 6.3  HGB 9.6* 9.8* 10.3*  HCT 27.3* 29.4* 30.1*  PLT 50* 56* 44*   Coag's  Recent Labs Lab 09/13/13 1136  INR 1.14   BMET  Recent Labs Lab 09/12/13 1345 09/13/13 0435 09/15/13 0520  NA 124* 127* 130*  K 4.1 3.8 3.7  CL 95* 98 102  CO2 14* 14* 16*  BUN 14 15 16   CREATININE 0.47* 0.44* 0.42*  GLUCOSE 79 65* 165*   Electrolytes  Recent Labs Lab 09/10/13 0635  09/12/13 1345 09/13/13 0435 09/15/13 0520  CALCIUM 7.6*  < > 7.6* 7.6* 9.1  MG 1.6  --   --   --   --   PHOS 3.6  --   --   --   --   < > = values in this interval not displayed. Sepsis Markers  Recent Labs Lab 09/26/2013 2209 09/10/13 0347 09/10/13 0635  LATICACIDVEN 3.6* 2.01 1.7  PROCALCITON  --   --  3.65   ABG No results found for this basename: PHART, PCO2ART, PO2ART,  in the last 168 hours  Liver Enzymes  Recent Labs Lab 09/12/2013 2209  AST 35  ALT 39*  ALKPHOS 578*  BILITOT 0.6  ALBUMIN 1.8*   Cardiac Enzymes  Recent Labs Lab 09/10/13 0635 09/10/13 1040 09/10/13 1830  TROPONINI <0.30 <0.30 <0.30   Glucose  Recent Labs Lab 09/14/13 1045 09/14/13 1230 09/14/13 1636 09/14/13 2045 09/15/13 0046 09/15/13 0328  GLUCAP 71 93 116* 125* 145* 152*   Imaging Ir Perc Cholecystostomy  09/14/2013   CLINICAL DATA:  Cholangiocarcinoma, sepsis and evidence of cholecystitis and gallbladder obstruction by imaging. The patient presents for percutaneous cholecystostomy.  EXAM: PERCUTANEOUS CHOLECYSTOSTOMY  MEDICATIONS: The patient is on broad-spectrum IV antibiotics and additional prophylactic antibiotics were not administered for the procedure.  ANESTHESIA/SEDATION: 25 mcg of IV fentanyl was administered for pain.  CONTRAST:  10 mL of Omnipaque 300 contrast  COMPARISON:  CT ABD/PELVIS W CM dated 09/10/2013; CT ABD/PELVIS W CM dated  09/03/2013  FLUOROSCOPY TIME:  42 seconds.  PROCEDURE: The procedure, risks, benefits, and alternatives were explained to the patient. Questions regarding the procedure were encouraged and answered. Informed consent was obtained from the patient's husband.  The right abdominal wall was prepped with Betadine in a sterile fashion, and a sterile drape was applied covering the operative field. A sterile gown and sterile gloves were used for the procedure. Local anesthesia was provided with 1% Lidocaine. Ultrasound image documentation was performed. Fluoroscopy during the procedure and fluoro spot radiograph confirms appropriate catheter position.  Ultrasound was utilized to localize the gallbladder. Under direct ultrasound guidance, a 21 gauge needle was advanced into the gallbladder lumen. Aspiration was performed and a bile sample sent for culture studies. A small amount of diluted contrast material was injected. A guide wire was then advanced into the gallbladder. A transitional dilator was placed.  Percutaneous tract dilatation was then performed over a guide wire to 10-French. A 10-French pigtail drainage catheter was then advanced into the gallbladder lumen under fluoroscopy. Catheter was formed and injected with contrast material to confirm position. The catheter was flushed and connected to a gravity drainage bag. It was secured at the skin with a Prolene retention suture and Stat-Lock device.  COMPLICATIONS: None  FINDINGS: After needle puncture of the gallbladder, aspiration yielded grossly the purulent fluid. A sample was sent for culture. The cholecystostomy tube was advanced into the gallbladder lumen and formed. It is now draining a large amount of purulent fluid. This tube will be left to gravity drainage.  IMPRESSION: Percutaneous cholecystostomy with placement of 10-French drainage catheter into the gallbladder lumen. This was left to gravity drainage. The gallbladder was filled with a large amount of  overtly purulent fluid.   Electronically Signed   By: Aletta Edouard M.D.   On: 09/14/2013 14:05   ASSESSMENT / PLAN:  PULMONARY A:  Bilateral pleural effusions Risk of CO2 retention with continuous opioid infusion DNI P:   Goal SpO2 Supplemental oxygen End-tidal CO2 monitoring for now, obviously can d/c if in full comfort care mode  CARDIOVASCULAR A:   Septic shock, resolved P:  Goal MAP > 60  RENAL A:   Dehydration  Metabolic acidosis Hyponatremia P:   Trend BMP D5 1/2NS @ 125  GASTROINTESTINAL A:   Metastatic cholangiocarcinoma Oral ulcers GI Px is not indicated Nutrition P:   Magic mouthwash, Chloraseptic spray Clear liquids as tolerated  HEMATOLOGIC  A:   Anemia Thrombocytopenia VTE Px P:  Trend CBC SCD  INFECTIOUS A:   Cholecystitis s/p percutaneous drain placement Possible cholangitis  P:   Abx / cxs as above D/c Vancomycin  ENDOCRINE A:   Adrenal insufficiency ( cortisol 9 in presence of shock ) Hypoglycemia resolved P:  Stress dose steroids CBG monitoring q4h  NEUROLOGIC A:   Cancer related pain P:   Dilaudid gtt ( 0.5 mg / h ) and boluses 0.5 mg q2h - this is goal for this morning, may increase if necessary Palliative care    Appears to be appropriate and relative stable for transition to hospice care, if so desired.  I have personally obtained history, examined patient, evaluated and interpreted laboratory and imaging results, reviewed medical records, formulated assessment / plan and placed orders.  Doree Fudge, MD Pulmonary and [place] Pager: [phone]  09/15/2013, 8:29 AM

## 2013-09-15 NOTE — Progress Notes (Signed)
Subjective: The patient is seen and examined today. Her husband and daughter were at the bedside. She still has significant fatigue and weakness but able to communicate verbally. She has no significant fever or chills overnight. She underwent cholecystotomy by interventional radiology yesterday. The patient and her family did not notice any significant improvement since the procedure.   Objective: Vital signs in last 24 hours: Temp:  [97.3 F (36.3 C)-98.9 F (37.2 C)] 97.3 F (36.3 C) (03/08 0800) Pulse Rate:  [36-141] 36 (03/08 0600) Resp:  [23-45] 34 (03/08 0600) BP: (83-124)/(40-82) 111/74 mmHg (03/08 0600) SpO2:  [93 %-100 %] 100 % (03/08 0600) Weight:  [138 lb 10.7 oz (62.9 kg)-140 lb 10.5 oz (63.8 kg)] 138 lb 10.7 oz (62.9 kg) (03/08 0500)  Intake/Output from previous day: 03/07 0701 - 03/08 0700 In: 3211.7 [I.V.:2774.2; IV Piggyback:437.5] Out: 3580 [Urine:655; Drains:2925] Intake/Output this shift:    General appearance: alert, cooperative, fatigued and mild distress Resp: clear to auscultation bilaterally Cardio: regular rate and rhythm, S1, S2 normal, no murmur, click, rub or gallop GI: soft, non-tender; bowel sounds normal; no masses,  no organomegaly Extremities: extremities normal, atraumatic, no cyanosis or edema  Lab Results:   Recent Labs  09/14/13 0512 09/15/13 0520  WBC 8.1 6.3  HGB 9.8* 10.3*  HCT 29.4* 30.1*  PLT 56* 44*   BMET  Recent Labs  09/13/13 0435 09/15/13 0520  NA 127* 130*  K 3.8 3.7  CL 98 102  CO2 14* 16*  GLUCOSE 65* 165*  BUN 15 16  CREATININE 0.44* 0.42*  CALCIUM 7.6* 9.1    Studies/Results: Ir Perc Cholecystostomy  09/14/2013   CLINICAL DATA:  Cholangiocarcinoma, sepsis and evidence of cholecystitis and gallbladder obstruction by imaging. The patient presents for percutaneous cholecystostomy.  EXAM: PERCUTANEOUS CHOLECYSTOSTOMY  MEDICATIONS: The patient is on broad-spectrum IV antibiotics and additional prophylactic  antibiotics were not administered for the procedure.  ANESTHESIA/SEDATION: 25 mcg of IV fentanyl was administered for pain.  CONTRAST:  10 mL of Omnipaque 300 contrast  COMPARISON:  CT ABD/PELVIS W CM dated 09/10/2013; CT ABD/PELVIS W CM dated 09/03/2013  FLUOROSCOPY TIME:  42 seconds.  PROCEDURE: The procedure, risks, benefits, and alternatives were explained to the patient. Questions regarding the procedure were encouraged and answered. Informed consent was obtained from the patient's husband.  The right abdominal wall was prepped with Betadine in a sterile fashion, and a sterile drape was applied covering the operative field. A sterile gown and sterile gloves were used for the procedure. Local anesthesia was provided with 1% Lidocaine. Ultrasound image documentation was performed. Fluoroscopy during the procedure and fluoro spot radiograph confirms appropriate catheter position.  Ultrasound was utilized to localize the gallbladder. Under direct ultrasound guidance, a 21 gauge needle was advanced into the gallbladder lumen. Aspiration was performed and a bile sample sent for culture studies. A small amount of diluted contrast material was injected. A guide wire was then advanced into the gallbladder. A transitional dilator was placed.  Percutaneous tract dilatation was then performed over a guide wire to 10-French. A 10-French pigtail drainage catheter was then advanced into the gallbladder lumen under fluoroscopy. Catheter was formed and injected with contrast material to confirm position. The catheter was flushed and connected to a gravity drainage bag. It was secured at the skin with a Prolene retention suture and Stat-Lock device.  COMPLICATIONS: None  FINDINGS: After needle puncture of the gallbladder, aspiration yielded grossly the purulent fluid. A sample was sent for culture. The cholecystostomy tube  was advanced into the gallbladder lumen and formed. It is now draining a large amount of purulent fluid. This  tube will be left to gravity drainage.  IMPRESSION: Percutaneous cholecystostomy with placement of 10-French drainage catheter into the gallbladder lumen. This was left to gravity drainage. The gallbladder was filled with a large amount of overtly purulent fluid.   Electronically Signed   By: Aletta Edouard M.D.   On: 09/14/2013 14:05    Medications: I have reviewed the patient's current medications.  Assessment/Plan: 1. 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 2. History of obstructive jaundice secondary to bile duct tumor, external biliary drain remains in place with a bile duct stent placed 07/15/2013. .  3. CT/nuclear medicine evidence of cholecystitis 09/10/2013. Status post cholecystotomy on 09/14/2013.  4. Pain secondary to extensive bone metastases including a destructive mass at L3 causing radicular pain in the right leg. She completed palliative radiation to the neck 09/06/2013  5. Admission with sepsis syndrome 06/28/2013, cultures negative, likely cholangitis, now off of pressors.  6. Left lower extremity DVT 07/10/2013-maintained on Lovenox  7. History of sacral and thoracic decubitus ulcers  8. History of hypercalcemia of malignancy, status post Zometa 06/28/2013  9. Admission 09/10/2013 with sepsis syndrome-now maintained on broad-spectrum intravenous antibiotics  10. Sore throat-she appears to have mucositis from C-spine radiation  11. Thrombocytopenia secondary to sepsis 12. Disposition: Expected discharge to [facility] in the next few days.     LOS: 6 days    Amani Marseille K. 09/15/2013

## 2013-09-15 NOTE — Progress Notes (Signed)
Subjective: Cholecystostomy yesterday.  Initial purulent fluid now clear, thin.  No significant abdominal pain.  Labored breathing.  Objective: Vital signs in last 24 hours: Temp:  [97 F (36.1 C)-98.9 F (37.2 C)] 97 F (36.1 C) (03/08 1200) Pulse Rate:  [36-133] 116 (03/08 1000) Resp:  [23-45] 35 (03/08 1200) BP: (83-117)/(40-78) 109/76 mmHg (03/08 1200) SpO2:  [93 %-100 %] 100 % (03/08 1200) Weight:  [138 lb 10.7 oz (62.9 kg)-140 lb 10.5 oz (63.8 kg)] 138 lb 10.7 oz (62.9 kg) (03/08 0500) Last BM Date: 09/14/13  Intake/Output from previous day: 03/07 0701 - 03/08 0700 In: 3211.7 [I.V.:2774.2; IV Piggyback:437.5] Out: 82 [Urine:655; Drains:2925] Intake/Output this shift: Total I/O In: -  Out: 350 [Drains:350]  Abdomen:  Distended, soft.  Perc choly exit site clean and dry.  Lab Results:   Recent Labs  09/14/13 0512 09/15/13 0520  WBC 8.1 6.3  HGB 9.8* 10.3*  HCT 29.4* 30.1*  PLT 56* 44*   BMET  Recent Labs  09/13/13 0435 09/15/13 0520  NA 127* 130*  K 3.8 3.7  CL 98 102  CO2 14* 16*  GLUCOSE 65* 165*  BUN 15 16  CREATININE 0.44* 0.42*  CALCIUM 7.6* 9.1   PT/INR  Recent Labs  09/13/13 1136  LABPROT 14.4  INR 1.14   ABG No results found for this basename: PHART, PCO2, PO2, HCO3,  in the last 72 hours  Studies/Results: Ir Perc Cholecystostomy  09/14/2013   CLINICAL DATA:  Cholangiocarcinoma, sepsis and evidence of cholecystitis and gallbladder obstruction by imaging. The patient presents for percutaneous cholecystostomy.  EXAM: PERCUTANEOUS CHOLECYSTOSTOMY  MEDICATIONS: The patient is on broad-spectrum IV antibiotics and additional prophylactic antibiotics were not administered for the procedure.  ANESTHESIA/SEDATION: 25 mcg of IV fentanyl was administered for pain.  CONTRAST:  10 mL of Omnipaque 300 contrast  COMPARISON:  CT ABD/PELVIS W CM dated 09/10/2013; CT ABD/PELVIS W CM dated 09/03/2013  FLUOROSCOPY TIME:  42 seconds.  PROCEDURE: The  procedure, risks, benefits, and alternatives were explained to the patient. Questions regarding the procedure were encouraged and answered. Informed consent was obtained from the patient's husband.  The right abdominal wall was prepped with Betadine in a sterile fashion, and a sterile drape was applied covering the operative field. A sterile gown and sterile gloves were used for the procedure. Local anesthesia was provided with 1% Lidocaine. Ultrasound image documentation was performed. Fluoroscopy during the procedure and fluoro spot radiograph confirms appropriate catheter position.  Ultrasound was utilized to localize the gallbladder. Under direct ultrasound guidance, a 21 gauge needle was advanced into the gallbladder lumen. Aspiration was performed and a bile sample sent for culture studies. A small amount of diluted contrast material was injected. A guide wire was then advanced into the gallbladder. A transitional dilator was placed.  Percutaneous tract dilatation was then performed over a guide wire to 10-French. A 10-French pigtail drainage catheter was then advanced into the gallbladder lumen under fluoroscopy. Catheter was formed and injected with contrast material to confirm position. The catheter was flushed and connected to a gravity drainage bag. It was secured at the skin with a Prolene retention suture and Stat-Lock device.  COMPLICATIONS: None  FINDINGS: After needle puncture of the gallbladder, aspiration yielded grossly the purulent fluid. A sample was sent for culture. The cholecystostomy tube was advanced into the gallbladder lumen and formed. It is now draining a large amount of purulent fluid. This tube will be left to gravity drainage.  IMPRESSION: Percutaneous cholecystostomy with placement  of 10-French drainage catheter into the gallbladder lumen. This was left to gravity drainage. The gallbladder was filled with a large amount of overtly purulent fluid.   Electronically Signed   By: Aletta Edouard M.D.   On: 09/14/2013 14:05    Anti-infectives: Anti-infectives   Start     Dose/Rate Route Frequency Ordered Stop   09/15/13 0600  vancomycin (VANCOCIN) 1,250 mg in sodium chloride 0.9 % 250 mL IVPB  Status:  Discontinued     1,250 mg 166.7 mL/hr over 90 Minutes Intravenous Every 24 hours 09/15/13 0008 09/15/13 0838   09/12/13 1000  vancomycin (VANCOCIN) IVPB 750 mg/150 ml premix  Status:  Discontinued     750 mg 150 mL/hr over 60 Minutes Intravenous Every 12 hours 09/12/13 0304 09/15/13 0006   09/10/13 1200  vancomycin (VANCOCIN) 500 mg in sodium chloride 0.9 % 100 mL IVPB  Status:  Discontinued     500 mg 100 mL/hr over 60 Minutes Intravenous Every 12 hours 09/10/13 0626 09/12/13 0305   09/10/13 0800  piperacillin-tazobactam (ZOSYN) IVPB 3.375 g     3.375 g 12.5 mL/hr over 240 Minutes Intravenous Every 8 hours 09/10/13 0626     09/10/13 0045  vancomycin (VANCOCIN) IVPB 750 mg/150 ml premix     750 mg 150 mL/hr over 60 Minutes Intravenous  Once 09/10/13 0021 09/10/13 0211   09/10/13 0000  piperacillin-tazobactam (ZOSYN) IVPB 3.375 g     3.375 g 100 mL/hr over 30 Minutes Intravenous  Once 09/14/2013 2359 09/10/13 0120      Assessment/Plan: s/p Percutaneous cholecystostomy tube yesterday.  Initial purulent drainage now high output of thin, clear yellow fluid.  Suspect that after GB decompression, adjacent ascites is now entering GB lumen and draining through defect in GB.  For potential transfer to [facility] in next 2 days.  Before D/C we will remove the capped external biliary drain at bedside.  No need for cholangiogram or tube injection since internal stent appears to be draining bile ducts adequately.   LOS: 6 days    Coda Mathey T 09/15/2013

## 2013-09-15 NOTE — Progress Notes (Signed)
ANTIBIOTIC CONSULT NOTE - FOLLOW UP  Pharmacy Consult for Vancomycin Indication: rule out sepsis  No Known Allergies  Patient Measurements: Height: 5\' 3"  (160 cm) Weight: 145 lb 8.1 oz (66 kg) IBW/kg (Calculated) : 52.4 Adjusted Body Weight:   Vital Signs: Temp: 98.2 F (36.8 C) (03/07 2000) Temp src: Axillary (03/07 2000) BP: 83/40 mmHg (03/07 1900) Pulse Rate: 125 (03/07 1900) Intake/Output from previous day: 03/07 0701 - 03/08 0700 In: 1751 [I.V.:1513.5; IV Piggyback:237.5] Out: 1980 [Urine:380; Drains:1600] Intake/Output from this shift:    Labs:  Recent Labs  09/12/13 0500 09/12/13 1345 09/13/13 0435 09/14/13 0512  WBC 7.8  --  4.5 8.1  HGB 10.1*  --  9.6* 9.8*  PLT 70*  --  50* 56*  CREATININE 0.47* 0.47* 0.44*  --    Estimated Creatinine Clearance: 68.2 ml/min (by C-G formula based on Cr of 0.44).  Recent Labs  09/14/13 2300  VANCOTROUGH 21.2*     Microbiology: Recent Results (from the past 720 hour(s))  TECHNOLOGIST REVIEW     Status: None   Collection Time    08/19/13  9:56 AM      Result Value Ref Range Status   Technologist Review Metas and Myelocytes present   Final  TECHNOLOGIST REVIEW     Status: None   Collection Time    09/03/13 12:08 PM      Result Value Ref Range Status   Technologist Review     Final   Value: Metas and Myelocytes present, mod toxic granulation  URINE CULTURE     Status: None   Collection Time    09/10/13 12:23 AM      Result Value Ref Range Status   Specimen Description URINE, CATHETERIZED   Final   Special Requests NONE   Final   Culture  Setup Time     Final   Value: 09/10/2013 09:00     Performed at [place]     Final   Value: NO GROWTH     Performed at [facility]   Culture     Final   Value: NO GROWTH     Performed at [facility]   Report Status 09/11/2013 FINAL   Final  CULTURE, BLOOD (ROUTINE X 2)     Status: None   Collection Time    09/10/13 12:44 AM       Result Value Ref Range Status   Specimen Description BLOOD RIGHT ANTECUBITAL   Final   Special Requests BOTTLES DRAWN AEROBIC AND ANAEROBIC 3CC   Final   Culture  Setup Time     Final   Value: 09/10/2013 02:38     Performed at [facility]   Culture     Final   Value:        BLOOD CULTURE RECEIVED NO GROWTH TO DATE CULTURE WILL BE HELD FOR 5 DAYS BEFORE ISSUING A FINAL NEGATIVE REPORT     Performed at [facility]   Report Status PENDING   Incomplete  CULTURE, BLOOD (ROUTINE X 2)     Status: None   Collection Time    09/10/13 12:45 AM      Result Value Ref Range Status   Specimen Description BLOOD LEFT HAND   Final   Special Requests BOTTLES DRAWN AEROBIC ONLY 2CC   Final   Culture  Setup Time     Final   Value: 09/10/2013 02:39     Performed at [facility]   Culture     Final   Value:        BLOOD CULTURE RECEIVED NO GROWTH TO DATE CULTURE WILL BE HELD FOR 5 DAYS BEFORE ISSUING A FINAL NEGATIVE REPORT     Performed at [facility]   Report Status PENDING   Incomplete  MRSA PCR SCREENING     Status: None   Collection Time    09/10/13  8:19 AM      Result Value Ref Range Status   MRSA by PCR NEGATIVE  NEGATIVE Final   Comment:            The GeneXpert MRSA Assay (FDA     approved for NASAL specimens     only), is one component of a     comprehensive MRSA colonization     surveillance program. It is not     intended to diagnose MRSA     infection nor to guide or     monitor treatment for     MRSA infections.    Anti-infectives   Start     Dose/Rate Route Frequency Ordered Stop   09/15/13 0600  vancomycin (VANCOCIN) 1,250 mg in sodium chloride 0.9 % 250 mL IVPB     1,250 mg 166.7 mL/hr over 90 Minutes Intravenous Every 24 hours 09/15/13 0008     09/12/13 1000  vancomycin (VANCOCIN) IVPB 750 mg/150 ml premix  Status:  Discontinued     750 mg 150 mL/hr over 60 Minutes Intravenous Every 12 hours 09/12/13 0304 09/15/13 0006   09/10/13  1200  vancomycin (VANCOCIN) 500 mg in sodium chloride 0.9 % 100 mL IVPB  Status:  Discontinued     500 mg 100 mL/hr over 60 Minutes Intravenous Every 12 hours 09/10/13 0626 09/12/13 0305   09/10/13 0800  piperacillin-tazobactam (ZOSYN) IVPB 3.375 g     3.375 g 12.5 mL/hr over 240 Minutes Intravenous Every 8 hours 09/10/13 0626     09/10/13 0045  vancomycin (VANCOCIN) IVPB 750 mg/150 ml premix     750 mg 150 mL/hr over 60 Minutes Intravenous  Once 09/10/13 0021 09/10/13 0211   09/10/13 0000  piperacillin-tazobactam (ZOSYN) IVPB 3.375 g     3.375 g 100 mL/hr over 30 Minutes Intravenous  Once 09/10/2013 2359 09/10/13 0120      Assessment: Patient with vancomycin level just above goal.  3/7 PM not hung.  Goal of Therapy:  Vancomycin trough level 15-20 mcg/ml  Plan:  Measure antibiotic drug levels at steady state Follow up culture results change vancomycin to 1250mg  iv q24hr, next dose at [place], Shea Stakes [place] 09/15/2013,12:11 AM

## 2013-09-15 NOTE — Progress Notes (Signed)
No [facility] beds available over the weekend or on Monday, per Erling Conte, [facility].   Weekday CSW to continue to follow.  Bernita Raisin, [place] Work [phone]

## 2013-09-16 LAB — CULTURE, BLOOD (ROUTINE X 2)
CULTURE: NO GROWTH
Culture: NO GROWTH

## 2013-09-17 LAB — CULTURE, ROUTINE-ABSCESS: Special Requests: NORMAL

## 2013-09-18 ENCOUNTER — Other Ambulatory Visit (HOSPITAL_COMMUNITY): Payer: BC Managed Care – PPO

## 2013-09-19 LAB — ANAEROBIC CULTURE: SPECIAL REQUESTS: NORMAL

## 2013-09-22 NOTE — Discharge Summary (Signed)
Name: Bianca Logan MRN: [id number] DOB: July 30, 1950  PCCM DEATH NOTE  Time of death:  09/24/2013  3:01 AM  Cause of death: Metastatic cholangiocarcinoma  Discharge diagnoses: Metastatic cholangiocarcinoma Bilateral pleural effusions Septic shock Dehydration Metabolic acidosis Hyponatremia Anemia Thrombocytopenia Cholecystitis Possible cholangitis Adrenal insufficiency Hypoglycemia  Brief hospital course:  This is 62 yo with metastatic cholangiocarcinoma admitted 3/2 with sepsis. The patient's condition, hospital course, ongoing treatment and prognosis were discussed with the family.  Questions were answered.  The consensus was reached that following patient's wishes no cardiopulmonary resuscitation should be attempted and comfort measures should be pursued. Patient expired shortly after.  No resuscitation was attempted.  Doree Fudge, MD Pulmonary and [place] Pager: [phone]

## 2013-09-23 ENCOUNTER — Other Ambulatory Visit: Payer: BC Managed Care – PPO

## 2013-09-23 ENCOUNTER — Ambulatory Visit: Payer: BC Managed Care – PPO | Admitting: Nurse Practitioner

## 2013-10-03 ENCOUNTER — Ambulatory Visit: Payer: BC Managed Care – PPO | Admitting: Radiation Oncology

## 2013-10-09 NOTE — Progress Notes (Signed)
Pt has no spontaneous signs of life. Monitor shows asystole. No heart sounds auscultated, verified by two nurses. Lorretta Harp, RN and Kallie Locks also.  [place],MD notified.  Family at the bedside.

## 2013-10-09 NOTE — Progress Notes (Signed)
Review sent to insurance company through Provider [employer]

## 2013-10-09 DEATH — deceased

## 2014-03-04 ENCOUNTER — Other Ambulatory Visit: Payer: Self-pay | Admitting: Pharmacist

## 2015-08-25 IMAGING — CT CT NECK W/ CM
4 of 5 series · 15 of 33 positions shown, 17 images · IV contrast (OMNIPAQUE)
Comparison: Prior chest CT from 05/15/2013.

CLINICAL DATA: Right neck, upper chest numbness. History of bony
metastases related to cholangiocarcinoma.

EXAM:
CT NECK WITH CONTRAST
TECHNIQUE: Multidetector CT imaging of the neck was performed using the
standard protocol following the bolus administration of intravenous
contrast.
CONTRAST:  80mL OMNIPAQUE IOHEXOL 300 MG/ML  SOLN

[Series 2: neck st · axial · 0.39mm/px · z∈[-116,+14]mm · 4 of 109 slices shown, 5 images]
[im 22/109  soft-tissue]
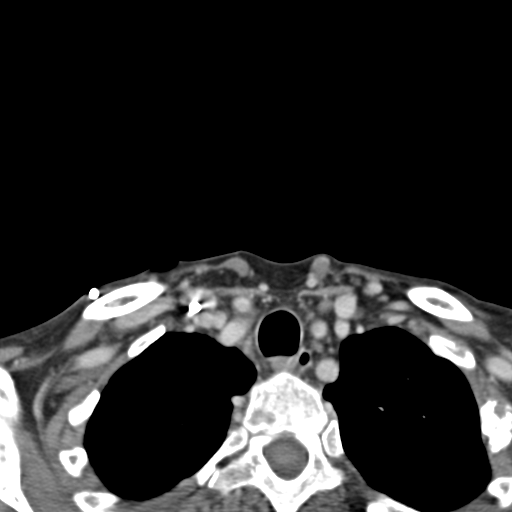
[im 22/109  bone]
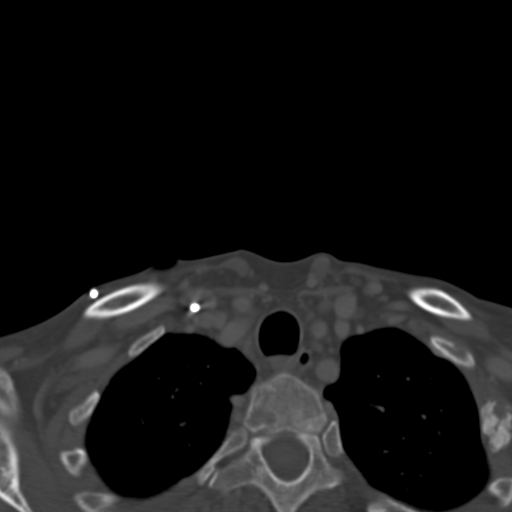
[im 44/109  bone]
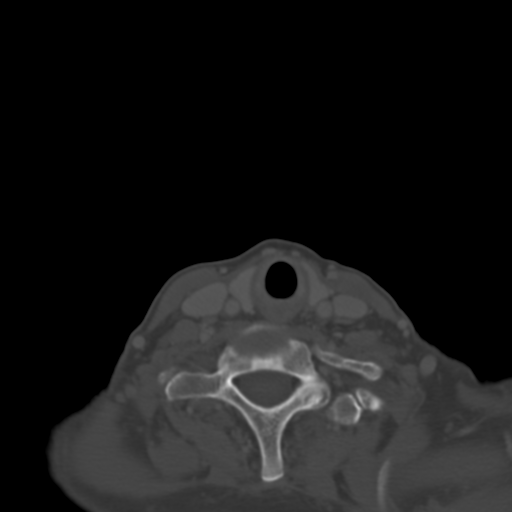
[im 65/109  bone]
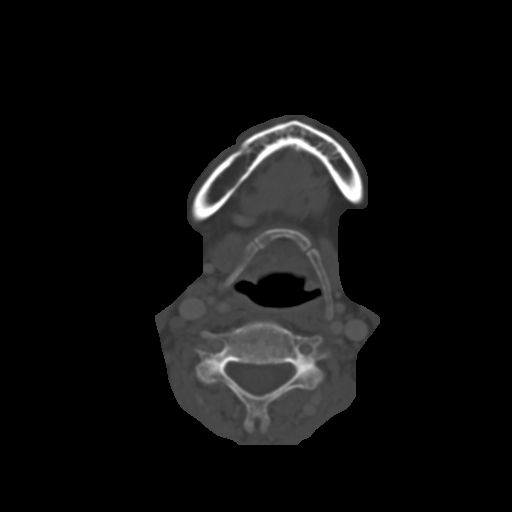
[im 87/109  bone]
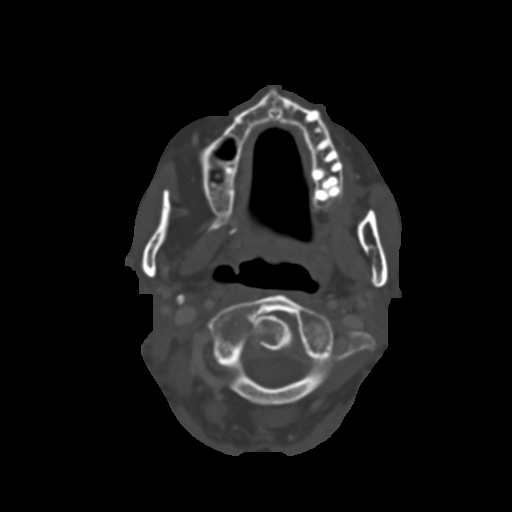

[Series 602: <mpr thick range> · coronal · 0.43mm/px · 3 of 87 slices shown]
[im 27/87  bone]
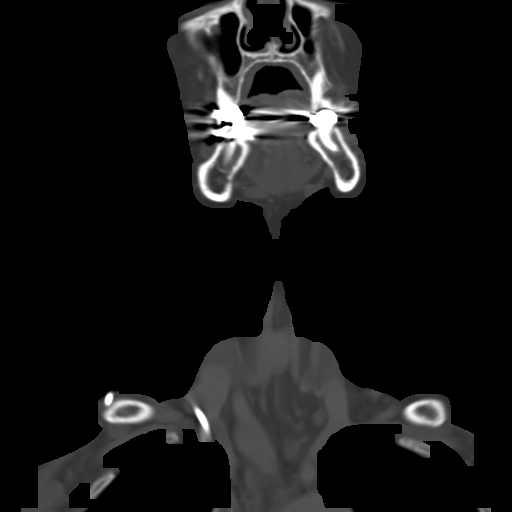
[im 38/87  bone]
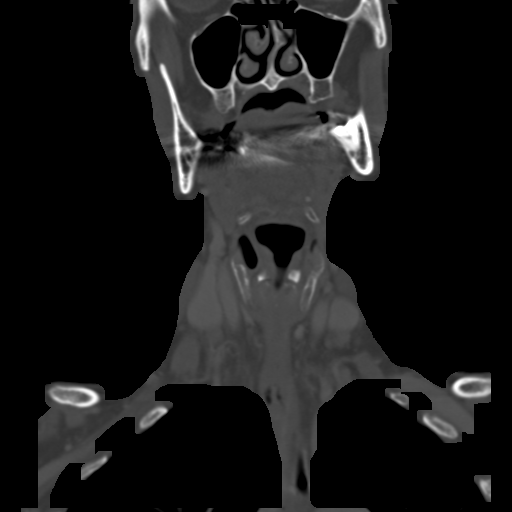
[im 49/87  bone]
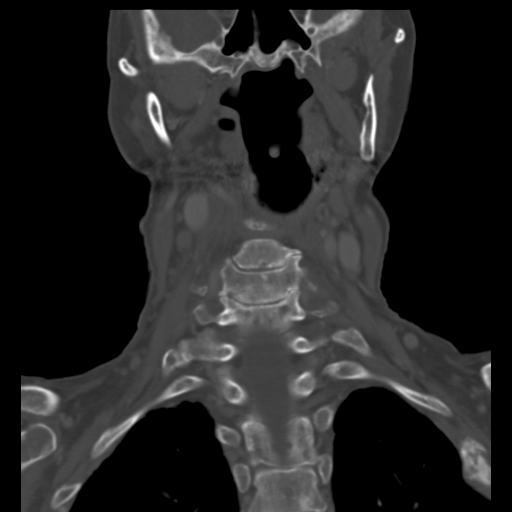

[Series 603: <mpr thick range(1)> · axial · 0.43mm/px · z∈[-169,-84]mm · 3 of 119 slices shown]
[im 24/119  bone]
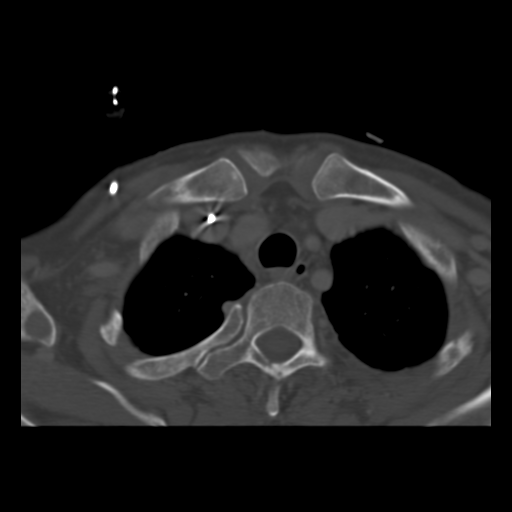
[im 48/119  bone]
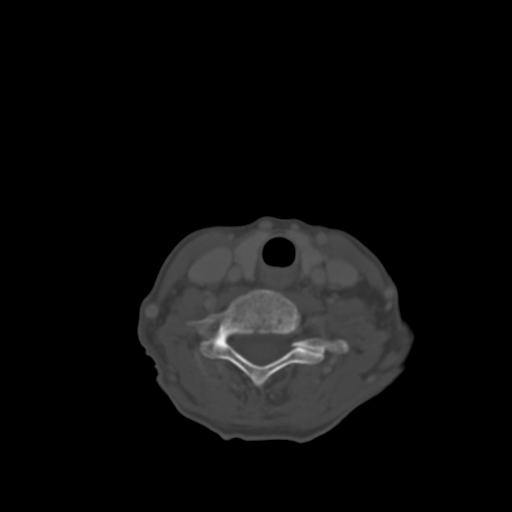
[im 71/119  bone]
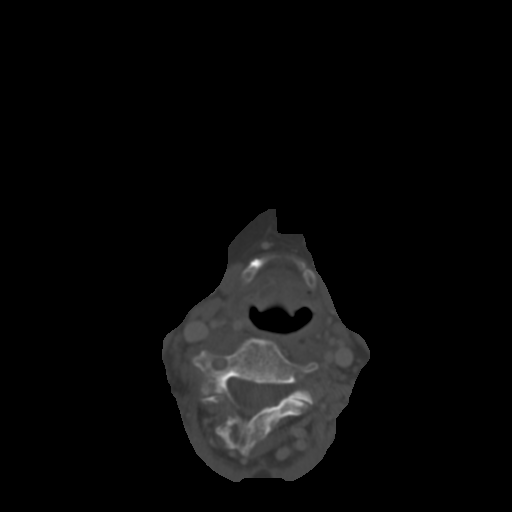

[Series 604: <mpr thick range(2)> · sagittal · 0.43mm/px · 5 of 73 slices shown, 6 images]
[im 25/73  bone]
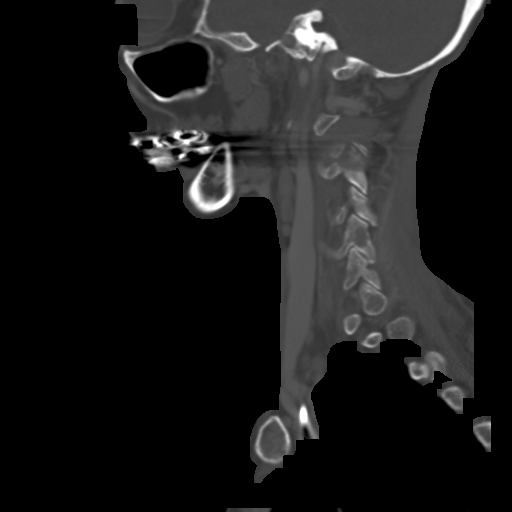
[im 31/73  bone]
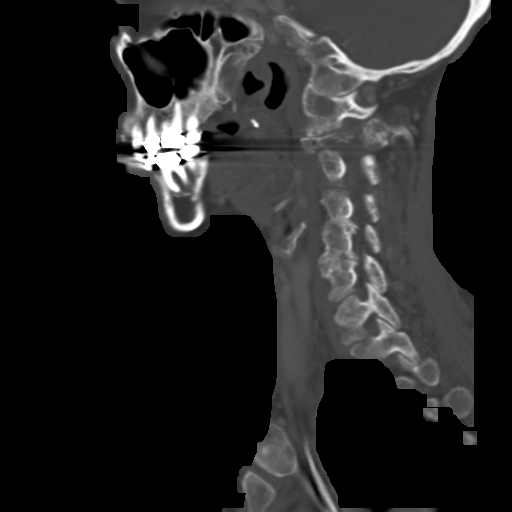
[im 37/73  soft-tissue]
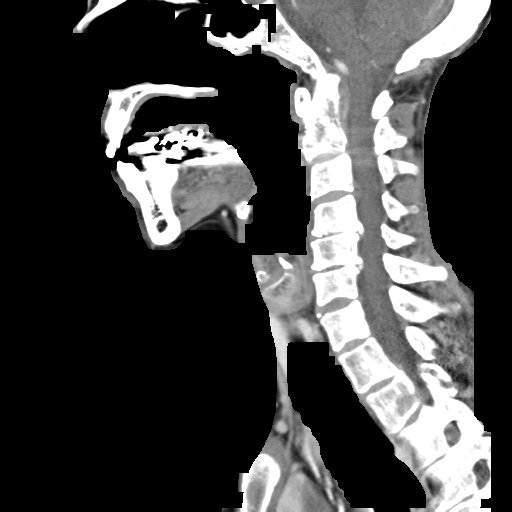
[im 37/73  bone]
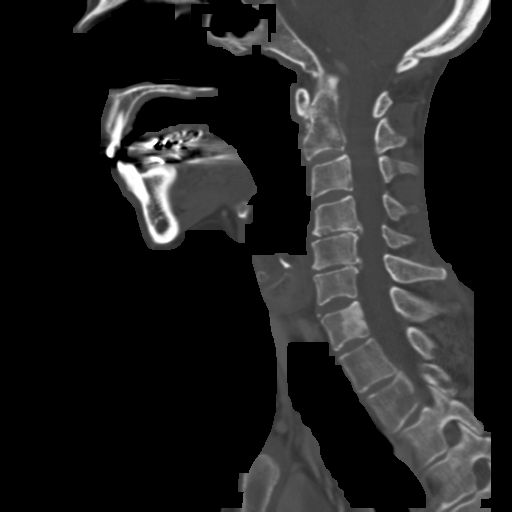
[im 43/73  bone]
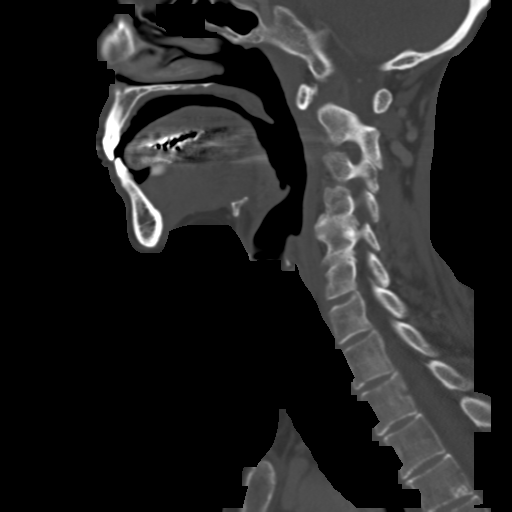
[im 49/73  bone]
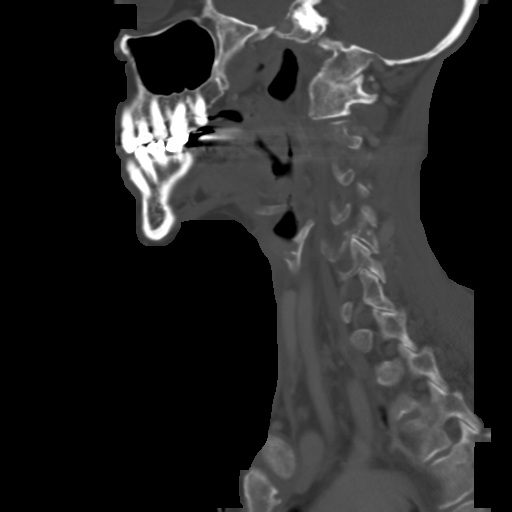

[15 of 33 positions shown; findings below may reference images not displayed]

FINDINGS: The visualized portions of the brain are unremarkable. Visualized
orbits are within normal limits.

The paranasal sinuses and mastoid air cells are clear.

The salivary glands including the parotid glands and submandibular
glands are normal.

Streak artifact from dental amalgam limits evaluation of the oral
cavity. The oral cavity is grossly unremarkable without evidence of
mass lesion or loculated fluid collection. Bilateral calcified
tonsilliths are noted. Parapharyngeal fat is preserved. Oropharynx
and nasopharynx are within normal limits. The hypopharynx and
supraglottic larynx are normal. True vocal cords are symmetric
bilaterally. Subglottic airway is normal.

Thyroid gland is within normal limits.

No pathologically enlarged lymph nodes are identified within the
neck. No adenopathy identified within the visualized superior
mediastinum.

Right-sided Port-A-Cath is noted.

The visualized lungs are clear.

A focal lytic lesion measuring 1.2 x 1.0 cm is seen within the right
lateral mass of C1 (series 2, image 24). There is cortical
destruction and breakthrough medially. Lytic lucency with cortical
disruption seen within the right aspect of the dens (series 2, image
24). Soft tissue mass with osseous destruction and fragmentation
with associated collapse and pathologic fracture involving
predominantly the right aspect of the C2 vertebral body (series 2,
image 31). There is involvement of the right pedicle and lamina of
C2. The left lateral mass of C1 is subluxed laterally relative to
the lateral mass of C2. There is probable invasion of the right C2-3
neural foramen. Lucency also seen within the left aspect of C2, best
appreciated on coronal sequence (series 602, image 59). Involvement
of the right aspect of C3 inferiorly as well (Series 602, image 55).
Findings are compatible with osseous metastases. The vertebral
arteries at these levels are opacified.

Additional osseous metastasis are seen inferiorly involving the C7,
T2, and T3 vertebral bodies. Small lytic metastasis noted about the
C4-5 facets bilaterally. Additional osseous metastatic lesions seen
involving the posterolateral third ribs bilaterally.
IMPRESSION: 1. Destructive lytic metastasis involving the right aspect of the C2
vertebral body with extension into the right posterior elements and
probable invasion of the right C2-3 neural foramen. There is
associated pathologic fracture and collapse of the right lateral
mass of C2.
2. Additional osseous metastatic lesions involving the right lateral
mass of C1 as well as the dens.
3. Additional osseous metastases as above.
Critical Value/emergent results were called by telephone at the time
of interpretation on 08/19/2013 at [DATE] to Dr. FERIENHAUS ERXLEBEN , who
verbally acknowledged these results.
